# Patient Record
Sex: Female | Born: 1970 | Race: Asian | Hispanic: No | Marital: Married | Smoking: Never smoker
Health system: Southern US, Community
[De-identification: ages and names within clinical notes are randomized; demographics above are authoritative.]

## PROBLEM LIST (undated history)

## (undated) DIAGNOSIS — N318 Other neuromuscular dysfunction of bladder: Secondary | ICD-10-CM

## (undated) DIAGNOSIS — N3281 Overactive bladder: Secondary | ICD-10-CM

## (undated) DIAGNOSIS — M6740 Ganglion, unspecified site: Secondary | ICD-10-CM

## (undated) DIAGNOSIS — J309 Allergic rhinitis, unspecified: Secondary | ICD-10-CM

## (undated) DIAGNOSIS — E785 Hyperlipidemia, unspecified: Secondary | ICD-10-CM

## (undated) DIAGNOSIS — R7303 Prediabetes: Secondary | ICD-10-CM

## (undated) DIAGNOSIS — R1032 Left lower quadrant pain: Secondary | ICD-10-CM

## (undated) HISTORY — DX: Hyperlipidemia, unspecified: E78.5

## (undated) HISTORY — DX: Prediabetes: R73.03

## (undated) HISTORY — DX: Left lower quadrant pain: R10.32

## (undated) HISTORY — PX: CHOLECYSTECTOMY: SHX55

## (undated) HISTORY — DX: Overactive bladder: N32.81

## (undated) HISTORY — DX: Ganglion, unspecified site: M67.40

## (undated) HISTORY — DX: Other neuromuscular dysfunction of bladder: N31.8

## (undated) HISTORY — DX: Allergic rhinitis, unspecified: J30.9

---

## 2003-02-22 ENCOUNTER — Other Ambulatory Visit: Admission: RE | Admit: 2003-02-22 | Discharge: 2003-02-22 | Payer: Self-pay | Admitting: Obstetrics & Gynecology

## 2005-02-27 ENCOUNTER — Ambulatory Visit: Payer: Self-pay | Admitting: Internal Medicine

## 2005-04-16 ENCOUNTER — Emergency Department (HOSPITAL_COMMUNITY): Admission: EM | Admit: 2005-04-16 | Discharge: 2005-04-16 | Payer: Self-pay | Admitting: Family Medicine

## 2005-08-06 ENCOUNTER — Ambulatory Visit: Payer: Self-pay | Admitting: Internal Medicine

## 2005-08-13 ENCOUNTER — Ambulatory Visit: Payer: Self-pay | Admitting: Internal Medicine

## 2006-06-24 LAB — CONVERTED CEMR LAB: Pap Smear: NORMAL

## 2006-07-28 ENCOUNTER — Ambulatory Visit: Payer: Self-pay | Admitting: Internal Medicine

## 2007-05-15 ENCOUNTER — Ambulatory Visit: Payer: Self-pay | Admitting: Internal Medicine

## 2007-05-15 DIAGNOSIS — R1032 Left lower quadrant pain: Secondary | ICD-10-CM

## 2007-05-15 DIAGNOSIS — N318 Other neuromuscular dysfunction of bladder: Secondary | ICD-10-CM

## 2007-05-15 HISTORY — DX: Other neuromuscular dysfunction of bladder: N31.8

## 2007-05-15 HISTORY — DX: Left lower quadrant pain: R10.32

## 2007-05-17 DIAGNOSIS — J309 Allergic rhinitis, unspecified: Secondary | ICD-10-CM | POA: Insufficient documentation

## 2007-05-17 HISTORY — DX: Allergic rhinitis, unspecified: J30.9

## 2007-07-10 ENCOUNTER — Ambulatory Visit: Payer: Self-pay | Admitting: Internal Medicine

## 2007-07-10 LAB — CONVERTED CEMR LAB
ALT: 23 units/L (ref 0–35)
Albumin: 4.1 g/dL (ref 3.5–5.2)
BUN: 11 mg/dL (ref 6–23)
Basophils Relative: 0.1 % (ref 0.0–1.0)
Bilirubin Urine: NEGATIVE
Calcium: 9.3 mg/dL (ref 8.4–10.5)
Crystals: NEGATIVE
Eosinophils Relative: 2.7 % (ref 0.0–5.0)
GFR calc Af Amer: 122 mL/min
Glucose, Bld: 91 mg/dL (ref 70–99)
HCT: 43.6 % (ref 36.0–46.0)
HDL: 39.7 mg/dL (ref >39.0)
Hemoglobin: 14.7 g/dL (ref 12.0–15.0)
Monocytes Absolute: 0.5 10*3/uL (ref 0.1–1.0)
Monocytes Relative: 7 % (ref 3.0–12.0)
Neutro Abs: 5 10*3/uL (ref 1.4–7.7)
Nitrite: NEGATIVE
RDW: 11.5 % (ref 11.5–14.6)
Total Protein, Urine: NEGATIVE mg/dL
Total Protein: 7.9 g/dL (ref 6.0–8.3)
Triglycerides: 112 mg/dL (ref 0–149)
pH: 6 (ref 5.0–8.0)

## 2007-07-17 ENCOUNTER — Ambulatory Visit: Payer: Self-pay | Admitting: Internal Medicine

## 2007-07-20 ENCOUNTER — Encounter: Payer: Self-pay | Admitting: Internal Medicine

## 2008-05-31 ENCOUNTER — Ambulatory Visit: Payer: Self-pay | Admitting: Internal Medicine

## 2008-05-31 DIAGNOSIS — M6740 Ganglion, unspecified site: Secondary | ICD-10-CM | POA: Insufficient documentation

## 2008-05-31 DIAGNOSIS — J069 Acute upper respiratory infection, unspecified: Secondary | ICD-10-CM | POA: Insufficient documentation

## 2008-05-31 HISTORY — DX: Ganglion, unspecified site: M67.40

## 2008-08-12 ENCOUNTER — Encounter: Payer: Self-pay | Admitting: Internal Medicine

## 2009-09-21 ENCOUNTER — Ambulatory Visit: Payer: Self-pay | Admitting: Internal Medicine

## 2009-09-21 LAB — CONVERTED CEMR LAB
AST: 19 units/L (ref 0–37)
Albumin: 4.5 g/dL (ref 3.5–5.2)
Alkaline Phosphatase: 52 units/L (ref 39–117)
Basophils Absolute: 0 10*3/uL (ref 0.0–0.1)
Bilirubin Urine: NEGATIVE
Bilirubin, Direct: 0.2 mg/dL (ref 0.0–0.3)
Calcium: 9.5 mg/dL (ref 8.4–10.5)
Eosinophils Relative: 1.7 % (ref 0.0–5.0)
GFR calc non Af Amer: 102.73 mL/min (ref >60)
Glucose, Bld: 104 mg/dL — ABNORMAL HIGH (ref 70–99)
HDL: 48 mg/dL (ref >39.00)
Ketones, ur: NEGATIVE mg/dL
LDL Cholesterol: 104 mg/dL — ABNORMAL HIGH (ref 0–99)
Leukocytes, UA: NEGATIVE
MCV: 91.1 fL (ref 78.0–100.0)
Monocytes Absolute: 0.5 10*3/uL (ref 0.1–1.0)
Monocytes Relative: 6.5 % (ref 3.0–12.0)
Neutrophils Relative %: 67.3 % (ref 43.0–77.0)
Nitrite: NEGATIVE
Platelets: 212 10*3/uL (ref 150.0–400.0)
Potassium: 4.3 meq/L (ref 3.5–5.1)
RDW: 12.4 % (ref 11.5–14.6)
Sodium: 138 meq/L (ref 135–145)
Total CHOL/HDL Ratio: 4
Triglycerides: 125 mg/dL (ref 0.0–149.0)
Urobilinogen, UA: 0.2 (ref 0.0–1.0)
VLDL: 25 mg/dL (ref 0.0–40.0)
WBC: 7.2 10*3/uL (ref 4.5–10.5)
pH: 5.5 (ref 5.0–8.0)

## 2009-09-27 ENCOUNTER — Ambulatory Visit: Payer: Self-pay | Admitting: Internal Medicine

## 2010-04-24 NOTE — Medication Information (Signed)
Summary: Prior Authorization for Allegra-D/ [employer]  Prior Authorization for Allegra-D/ [employer]   Imported By: Maryln Gottron 07/22/2007 13:30:09  _____________________________________________________________________  External Attachment:    Type:   Image     Comment:   External Document

## 2010-04-24 NOTE — Assessment & Plan Note (Signed)
Summary: CPX /NWS   #   Vital Signs:  Patient profile:   40 year old female Height:      62 inches Weight:      129 pounds BMI:     23.68 O2 Sat:      98 % on Room air Temp:     98.2 degrees F oral Pulse rate:   80 / minute BP sitting:   104 / 80  (left arm) Cuff size:   regular  Vitals Entered By: Zella Ball Ewing CMA Duncan Dull) (September 27, 2009 10:35 AM)  O2 Flow:  Room air  Preventive Care Screening  Pap Smear:    Next Due:  06/2009  CC: Adult Physical/RE   CC:  Adult Physical/RE.  History of Present Illness: overall doing well;  Pt denies CP, sob, doe, wheezing, orthopnea, pnd, worsening LE edema, palps, dizziness or syncope  Pt denies new neuro symptoms such as headache, facial or extremity weakness  No new  complaints  Problems Prior to Update: 1)  Ganglion Cyst, Wrist, Right  (ICD-727.41) 2)  Uri  (ICD-465.9) 3)  Preventive Health Care  (ICD-V70.0) 4)  Overactive Bladder  (ICD-596.51) 5)  Allergic Rhinitis  (ICD-477.9) 6)  Abdominal Pain, Left Lower Quadrant  (ICD-789.04)  Medications Prior to Update: 1)  Allegra-D 12 Hour 60-120 Mg  Tb12 (Fexofenadine-Pseudoephedrine) .Marland Kitchen.. 1 By Mouth Two Times A Day Prn  Current Medications (verified): 1)  Allegra-D 12 Hour 60-120 Mg  Tb12 (Fexofenadine-Pseudoephedrine) .Marland Kitchen.. 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         all otherwise negative per pt -    Physical Exam  General:  alert and well-developed.   Head:  normocephalic and atraumatic.   Eyes:  vision grossly intact, pupils equal, and pupils round.   Ears:  R ear normal and L ear normal.   Nose:  no external deformity and no nasal discharge.   Mouth:  no gingival abnormalities and pharynx pink and moist.   Neck:  supple and no masses.   Lungs:  normal respiratory effort and normal breath sounds.   Heart:  normal rate and regular rhythm.   Abdomen:  soft, non-tender, and normal bowel sounds.   Msk:  no joint tenderness and no joint swelling.   Extremities:  no edema, no erythema  Neurologic:  cranial nerves II-XII intact and strength normal in all extremities.   Skin:  color normal and no rashes.   Psych:  not anxious appearing and not depressed appearing.     Impression &  Recommendations:  Problem # 1:  Preventive Health Care (ICD-V70.0) Overall doing well, age appropriate education and counseling updated and referral for appropriate preventive services done unless declined, immunizations up to date or declined, diet counseling done if overweight, urged to quit smoking if smokes , most recent labs reviewed and current ordered if appropriate, ecg reviewed or declined (interpretation per ECG scanned in the EMR if done); information regarding Medicare Prevention  requirements given if appropriate; speciality referrals updated as appropriate   Complete Medication List: 1)  Allegra-d 12 Hour 60-120 Mg Tb12 (Fexofenadine-pseudoephedrine) .Marland Kitchen.. 1 by mouth two times a day prn  Other Orders: Tdap => 26yrs IM (16109) Admin 1st Vaccine (60454)  Patient Instructions: 1)  please call for your pap smear with your GYN 2)  you had the tetanus shot today 3)  please call for your baseline mammogram  - consider [facility] on [address], or [facility] [address] 4)  Continue all previous medications as before this visit  5)  please follow lower cholesterol diet 6)  Please schedule a follow-up appointment in 1 year or sooner if needed   Immunizations Administered:  Tetanus Vaccine:    Vaccine Type: Tdap    Site: left deltoid    Mfr: GlaxoSmithKline    Dose: 0.5 ml    Route: IM    Given by: Zella Ball Ewing CMA (AAMA)    Exp. Date: 06/16/2011    Lot #: [id number]    VIS given: 02/10/07 version given September 27, 2009.

## 2010-11-27 ENCOUNTER — Encounter: Payer: Self-pay | Admitting: Internal Medicine

## 2010-12-06 ENCOUNTER — Other Ambulatory Visit: Payer: Self-pay | Admitting: Internal Medicine

## 2010-12-06 DIAGNOSIS — Z0000 Encounter for general adult medical examination without abnormal findings: Secondary | ICD-10-CM

## 2010-12-07 ENCOUNTER — Encounter: Payer: Self-pay | Admitting: Internal Medicine

## 2010-12-07 ENCOUNTER — Other Ambulatory Visit: Payer: Self-pay

## 2010-12-07 DIAGNOSIS — Z0000 Encounter for general adult medical examination without abnormal findings: Secondary | ICD-10-CM | POA: Insufficient documentation

## 2010-12-07 DIAGNOSIS — Z0001 Encounter for general adult medical examination with abnormal findings: Secondary | ICD-10-CM | POA: Insufficient documentation

## 2010-12-12 ENCOUNTER — Other Ambulatory Visit (INDEPENDENT_AMBULATORY_CARE_PROVIDER_SITE_OTHER): Payer: Self-pay

## 2010-12-12 DIAGNOSIS — Z0000 Encounter for general adult medical examination without abnormal findings: Secondary | ICD-10-CM

## 2010-12-12 LAB — CBC WITH DIFFERENTIAL/PLATELET
Basophils Relative: 0.5 % (ref 0.0–3.0)
Eosinophils Absolute: 0.1 10*3/uL (ref 0.0–0.7)
Hemoglobin: 14.1 g/dL (ref 12.0–15.0)
Lymphs Abs: 1.6 10*3/uL (ref 0.7–4.0)
MCHC: 33.7 g/dL (ref 30.0–36.0)
MCV: 91.1 fl (ref 78.0–100.0)
Monocytes Absolute: 0.4 10*3/uL (ref 0.1–1.0)
Neutro Abs: 3.8 10*3/uL (ref 1.4–7.7)
RBC: 4.61 Mil/uL (ref 3.87–5.11)

## 2010-12-12 LAB — COMPREHENSIVE METABOLIC PANEL
AST: 20 U/L (ref 0–37)
Alkaline Phosphatase: 45 U/L (ref 39–117)
BUN: 11 mg/dL (ref 6–23)
Calcium: 9.6 mg/dL (ref 8.4–10.5)
Creatinine, Ser: 0.7 mg/dL (ref 0.4–1.2)

## 2010-12-12 LAB — URINALYSIS, ROUTINE W REFLEX MICROSCOPIC
Ketones, ur: NEGATIVE
Specific Gravity, Urine: 1.025 (ref 1.000–1.030)
Urine Glucose: NEGATIVE
Urobilinogen, UA: 0.2 (ref 0.0–1.0)

## 2010-12-12 LAB — LIPID PANEL
HDL: 47 mg/dL (ref >39.00)
LDL Cholesterol: 96 mg/dL (ref 0–99)
Total CHOL/HDL Ratio: 3
Triglycerides: 105 mg/dL (ref 0.0–149.0)

## 2010-12-13 ENCOUNTER — Encounter: Payer: Self-pay | Admitting: Internal Medicine

## 2010-12-13 ENCOUNTER — Ambulatory Visit (INDEPENDENT_AMBULATORY_CARE_PROVIDER_SITE_OTHER): Payer: BC Managed Care – PPO | Admitting: Internal Medicine

## 2010-12-13 VITALS — BP 100/62 | HR 65 | Temp 98.1°F | Ht 62.0 in | Wt 128.8 lb

## 2010-12-13 DIAGNOSIS — Z0000 Encounter for general adult medical examination without abnormal findings: Secondary | ICD-10-CM

## 2010-12-13 DIAGNOSIS — Z23 Encounter for immunization: Secondary | ICD-10-CM

## 2010-12-13 NOTE — Assessment & Plan Note (Signed)

## 2010-12-13 NOTE — Patient Instructions (Signed)
You had the flu shot today You are otherwise up to date, though I would recommend calling [facility] (on [address]) for a mammogram Please consider a self referral (or through your pediatrician) to Dr Ellison Carwin, Neurology for your son The current medical regimen is effective;  continue present plan and medications. Please return in 1 year for your yearly visit, or sooner if needed, with Lab testing done 3-5 days before

## 2010-12-13 NOTE — Progress Notes (Signed)
Subjective:    Patient ID: Becky Shaw, female    DOB: October 28, 1970, 40 y.o.   MRN: [id number]  HPI  Here for wellness and f/u;  Overall doing ok;  Pt denies CP, worsening SOB, DOE, wheezing, orthopnea, PND, worsening LE edema, palpitations, dizziness or syncope.  Pt denies neurological change such as new Headache, facial or extremity weakness.  Pt denies polydipsia, polyuria, or low sugar symptoms. Pt states overall good compliance with treatment and medications, good tolerability, and trying to follow lower cholesterol diet.  Pt denies worsening depressive symptoms, suicidal ideation or panic. No fever, wt loss, night sweats, loss of appetite, or other constitutional symptoms.  Pt states good ability with ADL's, low fall risk, home safety reviewed and adequate, no significant changes in hearing or vision, and walks 3 miles per day, and does some total gym most days as well, daily since feb 2012.  Of mention she was on menses at time of UA.  Does mention she has a 39 yo adopted [place] child who has been making what sounds like several months involuntary facial movements Past Medical History  Diagnosis Date  . Abdominal pain, left lower quadrant 05/15/2007  . ALLERGIC RHINITIS 05/17/2007  . GANGLION CYST, WRIST, RIGHT 05/31/2008  . OVERACTIVE BLADDER 05/15/2007  . OAB (overactive bladder)    Past Surgical History  Procedure Date  . Cholecystectomy     reports that she has never smoked. She does not have any smokeless tobacco history on file. She reports that she drinks alcohol. She reports that she does not use illicit drugs. family history includes Hypertension in her other and Stroke in her other. Allergies  Allergen Reactions  . Cetirizine Hcl     REACTION: insomnia   Current Outpatient Prescriptions on File Prior to Visit  Medication Sig Dispense Refill  . fexofenadine-pseudoephedrine (ALLEGRA-D 12 HOUR) 60-120 MG per tablet Take 1 tablet by mouth 2 (two) times daily.         Review of  Systems Review of Systems  Constitutional: Negative for diaphoresis, activity change, appetite change and unexpected weight change.  HENT: Negative for hearing loss, ear pain, facial swelling, mouth sores and neck stiffness.   Eyes: Negative for pain, redness and visual disturbance.  Respiratory: Negative for shortness of breath and wheezing.   Cardiovascular: Negative for chest pain and palpitations.  Gastrointestinal: Negative for diarrhea, blood in stool, abdominal distention and rectal pain.  Genitourinary: Negative for hematuria, flank pain and decreased urine volume.  Musculoskeletal: Negative for myalgias and joint swelling.  Skin: Negative for color change and wound.  Neurological: Negative for syncope and numbness.  Hematological: Negative for adenopathy.  Psychiatric/Behavioral: Negative for hallucinations, self-injury, decreased concentration and agitation.      Objective:   Physical Exam BP 100/62  Pulse 65  Temp(Src) 98.1 F (36.7 C) (Oral)  Ht 5\' 2"  (1.575 m)  Wt 128 lb 12 oz (58.401 kg)  BMI 23.55 kg/m2  SpO2 95%  LMP 12/10/2010 Physical Exam  VS noted Constitutional: Pt is oriented to person, place, and time. Appears well-developed and well-nourished.  HENT:  Head: Normocephalic and atraumatic.  Right Ear: External ear normal.  Left Ear: External ear normal.  Nose: Nose normal.  Mouth/Throat: Oropharynx is clear and moist.  Eyes: Conjunctivae and EOM are normal. Pupils are equal, round, and reactive to light.  Neck: Normal range of motion. Neck supple. No JVD present. No tracheal deviation present.  Cardiovascular: Normal rate, regular rhythm, normal heart sounds and intact distal pulses.  Pulmonary/Chest: Effort normal and breath sounds normal.  Abdominal: Soft. Bowel sounds are normal. There is no tenderness.  Musculoskeletal: Normal range of motion. Exhibits no edema.  Lymphadenopathy:  Has no cervical adenopathy.  Neurological: Pt is alert and oriented  to person, place, and time. Pt has normal reflexes. No cranial nerve deficit.  Skin: Skin is warm and dry. No rash noted.  Psychiatric:  Has  normal mood and affect. Behavior is normal.         Assessment & Plan:

## 2011-03-21 ENCOUNTER — Other Ambulatory Visit: Payer: Self-pay | Admitting: Obstetrics and Gynecology

## 2011-03-21 DIAGNOSIS — N63 Unspecified lump in unspecified breast: Secondary | ICD-10-CM

## 2011-03-29 ENCOUNTER — Ambulatory Visit
Admission: RE | Admit: 2011-03-29 | Discharge: 2011-03-29 | Disposition: A | Discharge status: Home / Self Care | Payer: BC Managed Care – PPO | Source: Ambulatory Visit | Attending: Obstetrics and Gynecology | Admitting: Obstetrics and Gynecology

## 2011-03-29 DIAGNOSIS — N63 Unspecified lump in unspecified breast: Secondary | ICD-10-CM

## 2011-12-10 ENCOUNTER — Other Ambulatory Visit (INDEPENDENT_AMBULATORY_CARE_PROVIDER_SITE_OTHER): Payer: BC Managed Care – PPO

## 2011-12-10 DIAGNOSIS — Z0000 Encounter for general adult medical examination without abnormal findings: Secondary | ICD-10-CM

## 2011-12-10 LAB — CBC WITH DIFFERENTIAL/PLATELET
Basophils Relative: 0.4 % (ref 0.0–3.0)
Eosinophils Relative: 1.6 % (ref 0.0–5.0)
Lymphocytes Relative: 30.4 % (ref 12.0–46.0)
MCV: 91.2 fl (ref 78.0–100.0)
Monocytes Relative: 6.2 % (ref 3.0–12.0)
Neutrophils Relative %: 61.4 % (ref 43.0–77.0)
RBC: 4.63 Mil/uL (ref 3.87–5.11)
WBC: 7 10*3/uL (ref 4.5–10.5)

## 2011-12-10 LAB — URINALYSIS, ROUTINE W REFLEX MICROSCOPIC
Bilirubin Urine: NEGATIVE
Ketones, ur: NEGATIVE
Urine Glucose: NEGATIVE
pH: 6 (ref 5.0–8.0)

## 2011-12-10 LAB — HEPATIC FUNCTION PANEL
Albumin: 4.2 g/dL (ref 3.5–5.2)
Alkaline Phosphatase: 40 U/L (ref 39–117)
Total Protein: 7.9 g/dL (ref 6.0–8.3)

## 2011-12-10 LAB — BASIC METABOLIC PANEL
CO2: 26 mEq/L (ref 19–32)
Chloride: 99 mEq/L (ref 96–112)
Creatinine, Ser: 0.6 mg/dL (ref 0.4–1.2)
GFR: 112.99 mL/min (ref >60.00)
Potassium: 3.8 mEq/L (ref 3.5–5.1)

## 2011-12-10 LAB — LIPID PANEL
HDL: 38.7 mg/dL — ABNORMAL LOW (ref >39.00)
LDL Cholesterol: 108 mg/dL — ABNORMAL HIGH (ref 0–99)
Total CHOL/HDL Ratio: 4
Triglycerides: 138 mg/dL (ref 0.0–149.0)
VLDL: 27.6 mg/dL (ref 0.0–40.0)

## 2011-12-10 LAB — TSH: TSH: 1.19 u[IU]/mL (ref 0.35–5.50)

## 2011-12-16 ENCOUNTER — Ambulatory Visit (INDEPENDENT_AMBULATORY_CARE_PROVIDER_SITE_OTHER): Payer: BC Managed Care – PPO | Admitting: Internal Medicine

## 2011-12-16 ENCOUNTER — Encounter: Payer: Self-pay | Admitting: Internal Medicine

## 2011-12-16 VITALS — BP 110/70 | HR 78 | Temp 97.6°F | Ht 62.0 in | Wt 134.4 lb

## 2011-12-16 DIAGNOSIS — K120 Recurrent oral aphthae: Secondary | ICD-10-CM

## 2011-12-16 DIAGNOSIS — Z23 Encounter for immunization: Secondary | ICD-10-CM

## 2011-12-16 DIAGNOSIS — Z0000 Encounter for general adult medical examination without abnormal findings: Secondary | ICD-10-CM

## 2011-12-16 MED ORDER — TRIAMCINOLONE ACETONIDE 0.1 % MT PSTE: PASTE | Freq: Two times a day (BID) | OROMUCOSAL | Status: DC

## 2011-12-16 NOTE — Assessment & Plan Note (Signed)

## 2011-12-16 NOTE — Patient Instructions (Addendum)
Take all new medications as prescribed Continue all other medications as before Please continue your efforts at being more active, low cholesterol diet, and weight control. You are otherwise up to date with prevention measures Please keep your appointments with your specialists as you have planned  - yearly GYN Please return in 1 year for your yearly visit, or sooner if needed, with Lab testing done 3-5 days before

## 2011-12-16 NOTE — Assessment & Plan Note (Signed)
For kenalog in orabase asd,  to f/u any worsening symptoms or concerns

## 2011-12-16 NOTE — Progress Notes (Signed)
Subjective:    Patient ID: Becky Shaw, female    DOB: 10-11-1970, 41 y.o.   MRN: [id number]  HPI  Here for wellness and f/u;  Overall doing ok;  Pt denies CP, worsening SOB, DOE, wheezing, orthopnea, PND, worsening LE edema, palpitations, dizziness or syncope.  Pt denies neurological change such as new Headache, facial or extremity weakness.  Pt denies polydipsia, polyuria, or low sugar symptoms. Pt states overall good compliance with treatment and medications, good tolerability, and trying to follow lower cholesterol diet.  Pt denies worsening depressive symptoms, suicidal ideation or panic. No fever, wt loss, night sweats, loss of appetite, or other constitutional symptoms.  Pt states good ability with ADL's, low fall risk, home safety reviewed and adequate, no significant changes in hearing or vision, and occasionally active with exercise.  Has recent mammogram/u/s for transient lump left breast, neg. Does also have several painful small mouth ulcers, no hx sjogrens or similar symptoms, or rheum diz Past Medical History  Diagnosis Date  . Abdominal pain, left lower quadrant 05/15/2007  . ALLERGIC RHINITIS 05/17/2007  . GANGLION CYST, WRIST, RIGHT 05/31/2008  . OVERACTIVE BLADDER 05/15/2007  . OAB (overactive bladder)    Past Surgical History  Procedure Date  . Cholecystectomy     reports that she has never smoked. She does not have any smokeless tobacco history on file. She reports that she drinks alcohol. She reports that she does not use illicit drugs. family history includes Hypertension in her other and Stroke in her other. Allergies  Allergen Reactions  . Cetirizine Hcl     REACTION: insomnia   Current Outpatient Prescriptions on File Prior to Visit  Medication Sig Dispense Refill  . fexofenadine-pseudoephedrine (ALLEGRA-D 12 HOUR) 60-120 MG per tablet Take 1 tablet by mouth 2 (two) times daily.         Review of Systems Review of Systems  Constitutional: Negative for diaphoresis,  activity change, appetite change and unexpected weight change.  HENT: Negative for hearing loss, ear pain, facial swelling, mouth sores and neck stiffness.   Eyes: Negative for pain, redness and visual disturbance.  Respiratory: Negative for shortness of breath and wheezing.   Cardiovascular: Negative for chest pain and palpitations.  Gastrointestinal: Negative for diarrhea, blood in stool, abdominal distention and rectal pain.  Genitourinary: Negative for hematuria, flank pain and decreased urine volume.  Musculoskeletal: Negative for myalgias and joint swelling.  Skin: Negative for color change and wound.  Neurological: Negative for syncope and numbness.  Hematological: Negative for adenopathy.  Psychiatric/Behavioral: Negative for hallucinations, self-injury, decreased concentration and agitation.      Objective:   Physical Exam BP 110/70  Pulse 78  Temp 97.6 F (36.4 C) (Oral)  Ht 5\' 2"  (1.575 m)  Wt 134 lb 6 oz (60.952 kg)  BMI 24.58 kg/m2  SpO2 97% Physical Exam  VS noted Constitutional: Pt is oriented to person, place, and time. Appears well-developed and well-nourished.  Head: Normocephalic and atraumatic.  Right Ear: External ear normal.  Left Ear: External ear normal.  Nose: Nose normal.  Mouth/Throat: Oropharynx is clear and moist. except for several apthous type ulcers below tongue Eyes: Conjunctivae and EOM are normal. Pupils are equal, round, and reactive to light.  Neck: Normal range of motion. Neck supple. No JVD present. No tracheal deviation present.  Cardiovascular: Normal rate, regular rhythm, normal heart sounds and intact distal pulses.   Pulmonary/Chest: Effort normal and breath sounds normal.  Abdominal: Soft. Bowel sounds are normal. There is no  tenderness.  Musculoskeletal: Normal range of motion. Exhibits no edema.  Lymphadenopathy:  Has no cervical adenopathy.  Neurological: Pt is alert and oriented to person, place, and time. Pt has normal reflexes.  No cranial nerve deficit.  Skin: Skin is warm and dry. No rash noted.  Psychiatric:  Has  normal mood and affect. Behavior is normal.     Assessment & Plan:

## 2012-04-28 ENCOUNTER — Other Ambulatory Visit: Payer: Self-pay | Admitting: Obstetrics and Gynecology

## 2012-04-28 DIAGNOSIS — Z1231 Encounter for screening mammogram for malignant neoplasm of breast: Secondary | ICD-10-CM

## 2012-06-01 ENCOUNTER — Ambulatory Visit
Admission: RE | Admit: 2012-06-01 | Discharge: 2012-06-01 | Disposition: A | Discharge status: Home / Self Care | Payer: BC Managed Care – PPO | Source: Ambulatory Visit | Attending: Obstetrics and Gynecology | Admitting: Obstetrics and Gynecology

## 2012-06-01 DIAGNOSIS — Z1231 Encounter for screening mammogram for malignant neoplasm of breast: Secondary | ICD-10-CM

## 2013-04-14 ENCOUNTER — Other Ambulatory Visit: Payer: Self-pay

## 2013-04-14 DIAGNOSIS — Z1231 Encounter for screening mammogram for malignant neoplasm of breast: Secondary | ICD-10-CM

## 2013-06-04 ENCOUNTER — Ambulatory Visit
Admission: RE | Admit: 2013-06-04 | Discharge: 2013-06-04 | Disposition: A | Discharge status: Home / Self Care | Payer: BC Managed Care – PPO | Source: Ambulatory Visit

## 2013-06-04 DIAGNOSIS — Z1231 Encounter for screening mammogram for malignant neoplasm of breast: Secondary | ICD-10-CM

## 2013-06-04 LAB — HM MAMMOGRAPHY

## 2014-02-01 ENCOUNTER — Ambulatory Visit: Payer: BC Managed Care – PPO | Admitting: Internal Medicine

## 2014-02-14 ENCOUNTER — Telehealth: Payer: Self-pay | Admitting: Internal Medicine

## 2014-02-14 DIAGNOSIS — Z0000 Encounter for general adult medical examination without abnormal findings: Secondary | ICD-10-CM

## 2014-02-14 NOTE — Telephone Encounter (Signed)
Patient came in for labs today but they are not entered.  Please follow up with patient in regards.

## 2014-02-14 NOTE — Telephone Encounter (Signed)
Pt has [employer] notified pt cpx labs entered...Raechel Chute/lmb

## 2014-02-16 ENCOUNTER — Other Ambulatory Visit (INDEPENDENT_AMBULATORY_CARE_PROVIDER_SITE_OTHER): Payer: PRIVATE HEALTH INSURANCE

## 2014-02-16 DIAGNOSIS — Z0000 Encounter for general adult medical examination without abnormal findings: Secondary | ICD-10-CM

## 2014-02-16 LAB — CBC WITH DIFFERENTIAL/PLATELET
BASOS ABS: 0 10*3/uL (ref 0.0–0.1)
Basophils Relative: 0.5 % (ref 0.0–3.0)
EOS ABS: 0.1 10*3/uL (ref 0.0–0.7)
Eosinophils Relative: 1 % (ref 0.0–5.0)
HCT: 43 % (ref 36.0–46.0)
Hemoglobin: 14.3 g/dL (ref 12.0–15.0)
Lymphocytes Relative: 25.6 % (ref 12.0–46.0)
Lymphs Abs: 1.7 10*3/uL (ref 0.7–4.0)
MCHC: 33.3 g/dL (ref 30.0–36.0)
MCV: 89.8 fl (ref 78.0–100.0)
MONO ABS: 0.4 10*3/uL (ref 0.1–1.0)
Monocytes Relative: 5.6 % (ref 3.0–12.0)
NEUTROS PCT: 67.3 % (ref 43.0–77.0)
Neutro Abs: 4.5 10*3/uL (ref 1.4–7.7)
Platelets: 226 10*3/uL (ref 150.0–400.0)
RBC: 4.78 Mil/uL (ref 3.87–5.11)
RDW: 12.4 % (ref 11.5–15.5)
WBC: 6.7 10*3/uL (ref 4.0–10.5)

## 2014-02-16 LAB — HEPATIC FUNCTION PANEL
ALBUMIN: 4.2 g/dL (ref 3.5–5.2)
ALT: 23 U/L (ref 0–35)
AST: 18 U/L (ref 0–37)
Alkaline Phosphatase: 50 U/L (ref 39–117)
BILIRUBIN DIRECT: 0.1 mg/dL (ref 0.0–0.3)
Total Bilirubin: 0.6 mg/dL (ref 0.2–1.2)
Total Protein: 7.6 g/dL (ref 6.0–8.3)

## 2014-02-16 LAB — LIPID PANEL
CHOLESTEROL: 130 mg/dL (ref 0–200)
HDL: 30.3 mg/dL — ABNORMAL LOW (ref >39.00)
LDL CALC: 81 mg/dL (ref 0–99)
NonHDL: 99.7
Total CHOL/HDL Ratio: 4
Triglycerides: 95 mg/dL (ref 0.0–149.0)
VLDL: 19 mg/dL (ref 0.0–40.0)

## 2014-02-16 LAB — URINALYSIS, ROUTINE W REFLEX MICROSCOPIC
BILIRUBIN URINE: NEGATIVE
Ketones, ur: NEGATIVE
NITRITE: NEGATIVE
Specific Gravity, Urine: 1.025 (ref 1.000–1.030)
Total Protein, Urine: NEGATIVE
UROBILINOGEN UA: 0.2 (ref 0.0–1.0)
Urine Glucose: NEGATIVE
pH: 5.5 (ref 5.0–8.0)

## 2014-02-16 LAB — BASIC METABOLIC PANEL
BUN: 12 mg/dL (ref 6–23)
CHLORIDE: 105 meq/L (ref 96–112)
CO2: 25 meq/L (ref 19–32)
Calcium: 9.1 mg/dL (ref 8.4–10.5)
Creatinine, Ser: 0.7 mg/dL (ref 0.4–1.2)
GFR: 102.22 mL/min (ref >60.00)
Glucose, Bld: 97 mg/dL (ref 70–99)
Potassium: 3.9 mEq/L (ref 3.5–5.1)
Sodium: 137 mEq/L (ref 135–145)

## 2014-02-16 LAB — TSH: TSH: 1.69 u[IU]/mL (ref 0.35–4.50)

## 2014-02-23 ENCOUNTER — Ambulatory Visit (INDEPENDENT_AMBULATORY_CARE_PROVIDER_SITE_OTHER): Payer: PRIVATE HEALTH INSURANCE | Admitting: Internal Medicine

## 2014-02-23 ENCOUNTER — Encounter: Payer: Self-pay | Admitting: Internal Medicine

## 2014-02-23 VITALS — BP 98/78 | HR 114 | Temp 98.3°F | Ht 62.5 in | Wt 138.0 lb

## 2014-02-23 DIAGNOSIS — N3000 Acute cystitis without hematuria: Secondary | ICD-10-CM

## 2014-02-23 DIAGNOSIS — N390 Urinary tract infection, site not specified: Secondary | ICD-10-CM | POA: Insufficient documentation

## 2014-02-23 DIAGNOSIS — Z0000 Encounter for general adult medical examination without abnormal findings: Secondary | ICD-10-CM

## 2014-02-23 MED ORDER — ONDANSETRON HCL 4 MG PO TABS: 4.0000 mg | ORAL_TABLET | Freq: Three times a day (TID) | ORAL | Status: DC | PRN

## 2014-02-23 MED ORDER — CEPHALEXIN 500 MG PO CAPS: 500.0000 mg | ORAL_CAPSULE | Freq: Four times a day (QID) | ORAL | Status: DC

## 2014-02-23 NOTE — Progress Notes (Signed)
Subjective:    Patient ID: Becky Shaw, female    DOB: [birth year], 43 y.o.   MRN: [id number]  HPI  Here for wellness and f/u;  Overall doing ok;  Pt denies CP, worsening SOB, DOE, wheezing, orthopnea, PND, worsening LE edema, palpitations, dizziness or syncope.  Pt denies neurological change such as new headache, facial or extremity weakness.  Pt denies polydipsia, polyuria, or low sugar symptoms. Pt states overall good compliance with treatment and medications, good tolerability, and has been trying to follow lower cholesterol diet.  Pt denies worsening depressive symptoms, suicidal ideation or panic. No fever, night sweats, wt loss, loss of appetite, or other constitutional symptoms.  Pt states good ability with ADL's, has low fall risk, home safety reviewed and adequate, no other significant changes in hearing or vision, and only occasionally active with exercise. Incidnetly with acute onset n/v/d mild since night, doesn't feel well, with mild dysuria    Declines flu shot Past Medical History  Diagnosis Date  . Abdominal pain, left lower quadrant 05/15/2007  . ALLERGIC RHINITIS 05/17/2007  . GANGLION CYST, WRIST, RIGHT 05/31/2008  . OVERACTIVE BLADDER 05/15/2007  . OAB (overactive bladder)    Past Surgical History  Procedure Laterality Date  . Cholecystectomy      reports that she has never smoked. She does not have any smokeless tobacco history on file. She reports that she drinks alcohol. She reports that she does not use illicit drugs. family history includes Hypertension in her other; Stroke in her other. Allergies  Allergen Reactions  . Cetirizine Hcl     REACTION: insomnia   No current outpatient prescriptions on file prior to visit.   No current facility-administered medications on file prior to visit.   Review of Systems Constitutional: Negative for increased diaphoresis, other activity, appetite or other siginficant weight change  HENT: Negative for worsening hearing loss,  ear pain, facial swelling, mouth sores and neck stiffness.   Eyes: Negative for other worsening pain, redness or visual disturbance.  Respiratory: Negative for shortness of breath and wheezing.   Cardiovascular: Negative for chest pain and palpitations.  Gastrointestinal: Negative for diarrhea, blood in stool, abdominal distention or other pain Genitourinary: Negative for hematuria, flank pain or change in urine volume.  Musculoskeletal: Negative for myalgias or other joint complaints.  Skin: Negative for color change and wound.  Neurological: Negative for syncope and numbness. other than noted Hematological: Negative for adenopathy. or other swelling Psychiatric/Behavioral: Negative for hallucinations, self-injury, decreased concentration or other worsening agitation.      Objective:   Physical Exam BP 98/78 mmHg  Pulse 114  Temp(Src) 98.3 F (36.8 C) (Oral)  Ht 5' 2.5" (1.588 m)  Wt 138 lb (62.596 kg)  BMI 24.82 kg/m2  SpO2 98% VS noted,  Constitutional: Pt is oriented to person, place, and time. Appears well-developed and well-nourished.  Head: Normocephalic and atraumatic.  Right Ear: External ear normal.  Left Ear: External ear normal.  Nose: Nose normal.  Mouth/Throat: Oropharynx is clear and moist.  Eyes: Conjunctivae and EOM are normal. Pupils are equal, round, and reactive to light.  Neck: Normal range of motion. Neck supple. No JVD present. No tracheal deviation present.  Cardiovascular: Normal rate, regular rhythm, normal heart sounds and intact distal pulses.   Pulmonary/Chest: Effort normal and breath sounds without rales or wheezing  Abdominal: Soft. Bowel sounds are normal. NT. No HSM  Musculoskeletal: Normal range of motion. Exhibits no edema.  Lymphadenopathy:  Has no cervical adenopathy.  Neurological:  Pt is alert and oriented to person, place, and time. Pt has normal reflexes. No cranial nerve deficit. Motor grossly intact Skin: Skin is warm and dry. No rash  noted.  Psychiatric:  Has normal mood and affect. Behavior is normal.      Assessment & Plan:

## 2014-02-23 NOTE — Patient Instructions (Signed)
Please take all new medication as prescribed - the antibiotic, and the nausea medication if needed  Please continue all other medications as before, and refills have been done if requested.  Please have the pharmacy call with any other refills you may need.  Please continue your efforts at being more active, low cholesterol diet, and weight control.  You are otherwise up to date with prevention measures today.  Please keep your appointments with your specialists as you may have planned  Please return in 1 year for your yearly visit, or sooner if needed, with Lab testing done 3-5 days before

## 2014-02-23 NOTE — Progress Notes (Signed)
Pre visit review using our clinic review tool, if applicable. No additional management support is needed unless otherwise documented below in the visit note. 

## 2014-02-23 NOTE — Assessment & Plan Note (Signed)
Mild to mod, for antibx course,  to f/u any worsening symptoms or concerns, also for antiemetic prn

## 2014-02-23 NOTE — Assessment & Plan Note (Signed)

## 2014-10-31 ENCOUNTER — Encounter (HOSPITAL_COMMUNITY): Payer: Self-pay | Admitting: Emergency Medicine

## 2014-10-31 ENCOUNTER — Emergency Department (INDEPENDENT_AMBULATORY_CARE_PROVIDER_SITE_OTHER)
Admission: EM | Admit: 2014-10-31 | Discharge: 2014-10-31 | Disposition: A | Discharge status: Home / Self Care | Payer: Self-pay | Source: Home / Self Care | Attending: Family Medicine | Admitting: Family Medicine

## 2014-10-31 DIAGNOSIS — Z041 Encounter for examination and observation following transport accident: Secondary | ICD-10-CM
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
## 2014-11-02 ENCOUNTER — Encounter (HOSPITAL_COMMUNITY): Payer: Self-pay | Admitting: *Deleted

## 2014-11-02 ENCOUNTER — Emergency Department (HOSPITAL_COMMUNITY)
Admission: EM | Admit: 2014-11-02 | Discharge: 2014-11-02 | Disposition: A | Discharge status: Home / Self Care | Payer: PRIVATE HEALTH INSURANCE | Attending: Emergency Medicine | Admitting: Emergency Medicine

## 2014-11-02 DIAGNOSIS — R42 Dizziness and giddiness: Secondary | ICD-10-CM | POA: Diagnosis not present

## 2014-11-02 DIAGNOSIS — R51 Headache: Secondary | ICD-10-CM | POA: Insufficient documentation

## 2014-11-02 DIAGNOSIS — R110 Nausea: Secondary | ICD-10-CM | POA: Insufficient documentation

## 2014-11-02 DIAGNOSIS — R519 Headache, unspecified: Secondary | ICD-10-CM

## 2014-11-02 DIAGNOSIS — Z87448 Personal history of other diseases of urinary system: Secondary | ICD-10-CM | POA: Diagnosis not present

## 2014-11-02 MED ORDER — ONDANSETRON 4 MG PO TBDP
4.0000 mg | ORAL_TABLET | Freq: Once | ORAL | Status: AC
  Administered 2014-11-02: 4 mg via ORAL
  Filled 2014-11-02: qty 1
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
## 2014-11-02 NOTE — ED Notes (Signed)
Patient reports she has throbbing headache with dizziness and nausea.  She states she has uneasy/shakiness feeling.  Patient denies neck or back pain.  She has not had any pain meds today.

## 2014-11-02 NOTE — Discharge Instructions (Signed)
Return to the ED with any concerns including vomiting, seizure activity, changes in vision or speech, difficulty breathing, abdominal pain, decreased level of alertness/lethargy, or any other alarming symptoms
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
## 2015-03-12 ENCOUNTER — Emergency Department (HOSPITAL_COMMUNITY)
Admission: EM | Admit: 2015-03-12 | Discharge: 2015-03-12 | Disposition: A | Discharge status: Home / Self Care | Payer: PRIVATE HEALTH INSURANCE | Attending: Emergency Medicine | Admitting: Emergency Medicine

## 2015-03-12 ENCOUNTER — Encounter (HOSPITAL_COMMUNITY): Payer: Self-pay | Admitting: Emergency Medicine

## 2015-03-12 DIAGNOSIS — Z8739 Personal history of other diseases of the musculoskeletal system and connective tissue: Secondary | ICD-10-CM | POA: Insufficient documentation

## 2015-03-12 DIAGNOSIS — Z8742 Personal history of other diseases of the female genital tract: Secondary | ICD-10-CM | POA: Insufficient documentation

## 2015-03-12 DIAGNOSIS — I159 Secondary hypertension, unspecified: Secondary | ICD-10-CM

## 2015-03-12 LAB — COMPREHENSIVE METABOLIC PANEL WITH GFR
ALT: 21 U/L (ref 14–54)
AST: 19 U/L (ref 15–41)
Albumin: 4.1 g/dL (ref 3.5–5.0)
Alkaline Phosphatase: 45 U/L (ref 38–126)
Anion gap: 7 (ref 5–15)
BUN: 9 mg/dL (ref 6–20)
CO2: 25 mmol/L (ref 22–32)
Calcium: 9.6 mg/dL (ref 8.9–10.3)
Chloride: 105 mmol/L (ref 101–111)
Creatinine, Ser: 0.7 mg/dL (ref 0.44–1.00)
GFR calc Af Amer: >60 mL/min
GFR calc non Af Amer: >60 mL/min
Glucose, Bld: 107 mg/dL — ABNORMAL HIGH (ref 65–99)
Potassium: 3.4 mmol/L — ABNORMAL LOW (ref 3.5–5.1)
Sodium: 137 mmol/L (ref 135–145)
Total Bilirubin: 1 mg/dL (ref 0.3–1.2)
Total Protein: 7.4 g/dL (ref 6.5–8.1)

## 2015-03-12 LAB — CBC WITH DIFFERENTIAL/PLATELET
Basophils Absolute: 0 K/uL (ref 0.0–0.1)
Basophils Relative: 0 %
Eosinophils Absolute: 0.1 K/uL (ref 0.0–0.7)
Eosinophils Relative: 2 %
HCT: 39.1 % (ref 36.0–46.0)
Hemoglobin: 13.1 g/dL (ref 12.0–15.0)
Lymphocytes Relative: 27 %
Lymphs Abs: 1.6 K/uL (ref 0.7–4.0)
MCH: 30.5 pg (ref 26.0–34.0)
MCHC: 33.5 g/dL (ref 30.0–36.0)
MCV: 90.9 fL (ref 78.0–100.0)
Monocytes Absolute: 0.4 K/uL (ref 0.1–1.0)
Monocytes Relative: 7 %
Neutro Abs: 3.8 K/uL (ref 1.7–7.7)
Neutrophils Relative %: 64 %
Platelets: 195 K/uL (ref 150–400)
RBC: 4.3 MIL/uL (ref 3.87–5.11)
RDW: 12.1 % (ref 11.5–15.5)
WBC: 5.9 K/uL (ref 4.0–10.5)

## 2015-03-12 NOTE — Discharge Instructions (Signed)
Hypertension Hypertension is another name for high blood pressure. High blood pressure forces your heart to work harder to pump blood. A blood pressure reading has two numbers, which includes a higher number over a lower number (example: 110/72). HOME CARE   Have your blood pressure rechecked by your doctor.  Only take medicine as told by your doctor. Follow the directions carefully. The medicine does not work as well if you skip doses. Skipping doses also puts you at risk for problems.  Do not smoke.  Monitor your blood pressure at home as told by your doctor. GET HELP IF:  You think you are having a reaction to the medicine you are taking.  You have repeat headaches or feel dizzy.  You have puffiness (swelling) in your ankles.  You have trouble with your vision. GET HELP RIGHT AWAY IF:   You get a very bad headache and are confused.  You feel weak, numb, or faint.  You get chest or belly (abdominal) pain.  You throw up (vomit).  You cannot breathe very well. MAKE SURE YOU:   Understand these instructions.  Will watch your condition.  Will get help right away if you are not doing well or get worse.   This information is not intended to replace advice given to you by your health care provider. Make sure you discuss any questions you have with your health care provider.  Your blood pressure measurement today was a nonemergent value. However, follow up with your primary care provider for additional evaluations. If blood pressure remains elevated may warrant medication management in the future. Return to the emergency department if you experience severe headache, blurry vision, numbness or tingling in her extremities, chest pain, shortness of breath.

## 2015-03-12 NOTE — ED Notes (Signed)
Pt reports that she has checking her blood pressure multiple times in the past two days and found it to either not read or be high (160/108, 160/118)  She reports that the machine has worked on other people but sometimes not on her.  She also reports that her stomach is "bothering her but no pain".  She has not had an appetite but when she eats "just a little..my stomach feels really full"

## 2015-03-14 NOTE — ED Provider Notes (Signed)
CSN: [id number]     Arrival date & time 03/12/15  2025 History   First MD Initiated Contact with Patient 03/12/15 2032     Chief Complaint  Patient presents with  . Hypertension  . Abdominal Pain     (Consider location/radiation/quality/duration/timing/severity/associated sxs/prior Treatment) HPI   Becky Shaw is a 44 y.o F with no significant pmhx who presents to the ED concerned that her BP is elevated. Pt states that she was at her sister-in-laws house yesterday who has HTN and was checking her BP so the pt decided to check her own out of curiosity. Pt states that it read 220/180 and pt became concerned so she came to the ED to be evaluated. No hx of HTN. Denies HA, blurry vision, numbness, weakness, dysuria, abdominal pain, vomiting, chest pain, SOB, syncope.   Past Medical History  Diagnosis Date  . Abdominal pain, left lower quadrant 05/15/2007  . ALLERGIC RHINITIS 05/17/2007  . GANGLION CYST, WRIST, RIGHT 05/31/2008  . OVERACTIVE BLADDER 05/15/2007  . OAB (overactive bladder)    Past Surgical History  Procedure Laterality Date  . Cholecystectomy     Family History  Problem Relation Age of Onset  . Stroke Other   . Hypertension Other    Social History  Substance Use Topics  . Smoking status: Never Smoker   . Smokeless tobacco: None  . Alcohol Use: Yes   OB History    No data available     Review of Systems  All other systems reviewed and are negative.     Allergies  Cetirizine hcl  Home Medications   Prior to Admission medications   Medication Sig Start Date End Date Taking? Authorizing Provider  Cholecalciferol (VITAMIN D3) 5000 UNITS TABS Take 1 tablet by mouth daily.   Yes Historical Provider, MD  Multiple Vitamin (MULTIVITAMIN WITH MINERALS) TABS tablet Take 1 tablet by mouth daily.   Yes Historical Provider, MD  cephALEXin (KEFLEX) 500 MG capsule Take 1 capsule (500 mg total) by mouth 4 (four) times daily. Patient not taking: Reported on 10/31/2014  02/23/14   Corwin Levins, MD  ondansetron (ZOFRAN) 4 MG tablet Take 1 tablet (4 mg total) by mouth every 8 (eight) hours as needed for nausea or vomiting. Patient not taking: Reported on 03/12/2015 02/23/14   Corwin Levins, MD   BP 146/81 mmHg  Pulse 75  Temp(Src) 98.4 F (36.9 C) (Oral)  Resp 16  Ht  (1.6 m)  Wt 61.236 kg  BMI 23.92 kg/m2  SpO2 98% Physical Exam  Constitutional: She is oriented to person, place, and time. She appears well-developed and well-nourished. No distress.  HENT:  Head: Normocephalic and atraumatic.  Mouth/Throat: No oropharyngeal exudate.  Eyes: Conjunctivae and EOM are normal. Pupils are equal, round, and reactive to light. Right eye exhibits no discharge. Left eye exhibits no discharge. No scleral icterus.  Neck: Neck supple. No JVD present.  Cardiovascular: Normal rate, regular rhythm, normal heart sounds and intact distal pulses.  Exam reveals no gallop and no friction rub.   No murmur heard. Pulmonary/Chest: Effort normal and breath sounds normal. No respiratory distress. She has no wheezes. She has no rales. She exhibits no tenderness.  Abdominal: Soft. She exhibits no distension. There is no tenderness. There is no guarding.  Musculoskeletal: Normal range of motion. She exhibits no edema.  Neurological: She is alert and oriented to person, place, and time. No cranial nerve deficit.  Strength 5/5 throughout. No sensory deficits.  No gait  abnormality.  Skin: Skin is warm and dry. No rash noted. She is not diaphoretic. No erythema. No pallor.  Psychiatric: She has a normal mood and affect. Her behavior is normal.  Nursing note and vitals reviewed.   ED Course  Procedures (including critical care time) Labs Review Labs Reviewed  COMPREHENSIVE METABOLIC PANEL - Abnormal; Notable for the following:    Potassium 3.4 (*)    Glucose, Bld 107 (*)    All other components within normal limits  CBC WITH DIFFERENTIAL/PLATELET    Imaging Review No  results found. I have personally reviewed and evaluated these images and lab results as part of my medical decision-making.   EKG Interpretation None      MDM   Final diagnoses:  Secondary hypertension, unspecified   Otherwise healthy 44 y.o F presents for concern of HTN. Pt notes that her BP was elevated when she checked it on a home monitor yesterday.  In ED, BP is 146/81. No other complaints. No evidence of end organ damage. All lab work wnl. No chest pain, HA, blurry vision.  No signs of hypertensive urgency. Pt is hemodynamically stable. Discussed with pt close follow up with PCP for re-evaluation. If BP remains elevated may warrant medication management in the future.      Lester KinsmanSamantha Tripp [place], PA-C 03/14/15 1756  Alvira MondayErin Schlossman, MD 03/16/15 [phone]

## 2015-03-22 ENCOUNTER — Ambulatory Visit (INDEPENDENT_AMBULATORY_CARE_PROVIDER_SITE_OTHER): Payer: PRIVATE HEALTH INSURANCE | Admitting: Internal Medicine

## 2015-03-22 ENCOUNTER — Encounter: Payer: Self-pay | Admitting: Internal Medicine

## 2015-03-22 VITALS — BP 134/92 | HR 84 | Temp 98.5°F | Ht 63.0 in | Wt 135.0 lb

## 2015-03-22 DIAGNOSIS — Z0000 Encounter for general adult medical examination without abnormal findings: Secondary | ICD-10-CM

## 2015-03-22 DIAGNOSIS — I10 Essential (primary) hypertension: Secondary | ICD-10-CM | POA: Insufficient documentation

## 2015-03-22 DIAGNOSIS — R030 Elevated blood-pressure reading, without diagnosis of hypertension: Secondary | ICD-10-CM

## 2015-03-22 DIAGNOSIS — IMO0001 Reserved for inherently not codable concepts without codable children: Secondary | ICD-10-CM

## 2015-03-22 DIAGNOSIS — Z0189 Encounter for other specified special examinations: Secondary | ICD-10-CM | POA: Diagnosis not present

## 2015-03-22 HISTORY — DX: Essential (primary) hypertension: I10

## 2015-03-22 NOTE — Progress Notes (Signed)
Subjective:    Patient ID: Becky Shaw, female    DOB: 18-Mar-1971, 44 y.o.   MRN: [id number]  HPI  Here for wellness and f/u;  Overall doing ok;  Pt denies Chest pain, worsening SOB, DOE, wheezing, orthopnea, PND, worsening LE edema, palpitations, dizziness or syncope.  Pt denies neurological change such as new headache, facial or extremity weakness.  Pt denies polydipsia, polyuria, or low sugar symptoms. Pt states overall good compliance with treatment and medications, good tolerability, and has been trying to follow appropriate diet.  Pt denies worsening depressive symptoms, suicidal ideation or panic. No fever, night sweats, wt loss, loss of appetite, or other constitutional symptoms.  Pt states good ability with ADL's, has low fall risk, home safety reviewed and adequate, no other significant changes in hearing or vision, and only occasionally active with exercise.  Declines flu shot.  No current complaints  Has had mild elev SBP per pt only since starting taking OTC supplements including mult herbals and DHEA BP Readings from Last 3 Encounters:  03/22/15 134/92  03/12/15 146/81  11/02/14 116/75   Past Medical History  Diagnosis Date  . Abdominal pain, left lower quadrant 05/15/2007  . ALLERGIC RHINITIS 05/17/2007  . GANGLION CYST, WRIST, RIGHT 05/31/2008  . OVERACTIVE BLADDER 05/15/2007  . OAB (overactive bladder)    Past Surgical History  Procedure Laterality Date  . Cholecystectomy      reports that she has never smoked. She does not have any smokeless tobacco history on file. She reports that she drinks alcohol. She reports that she does not use illicit drugs. family history includes Hypertension in her other; Stroke in her other. Allergies  Allergen Reactions  . Cetirizine Hcl     REACTION: insomnia   Current Outpatient Prescriptions on File Prior to Visit  Medication Sig Dispense Refill  . Cholecalciferol (VITAMIN D3) 5000 UNITS TABS Take 1 tablet by mouth daily. Reported on  03/22/2015    . Multiple Vitamin (MULTIVITAMIN WITH MINERALS) TABS tablet Take 1 tablet by mouth daily. Reported on 03/22/2015     No current facility-administered medications on file prior to visit.    Review of Systems Constitutional: Negative for increased diaphoresis, other activity, appetite or siginficant weight change other than noted HENT: Negative for worsening hearing loss, ear pain, facial swelling, mouth sores and neck stiffness.   Eyes: Negative for other worsening pain, redness or visual disturbance.  Respiratory: Negative for shortness of breath and wheezing  Cardiovascular: Negative for chest pain and palpitations.  Gastrointestinal: Negative for diarrhea, blood in stool, abdominal distention or other pain Genitourinary: Negative for hematuria, flank pain or change in urine volume.  Musculoskeletal: Negative for myalgias or other joint complaints.  Skin: Negative for color change and wound or drainage.  Neurological: Negative for syncope and numbness. other than noted Hematological: Negative for adenopathy. or other swelling Psychiatric/Behavioral: Negative for hallucinations, SI, self-injury, decreased concentration or other worsening agitation.      Objective:   Physical Exam BP 134/92 mmHg  Pulse 84  Temp(Src) 98.5 F (36.9 C) (Oral)  Ht  (1.6 m)  Wt 135 lb (61.236 kg)  BMI 23.92 kg/m2  SpO2 97% VS noted,  Constitutional: Pt is oriented to person, place, and time. Appears well-developed and well-nourished, in no significant distress Head: Normocephalic and atraumatic.  Right Ear: External ear normal.  Left Ear: External ear normal.  Nose: Nose normal.  Mouth/Throat: Oropharynx is clear and moist.  Eyes: Conjunctivae and EOM are normal. Pupils are  equal, round, and reactive to light.  Neck: Normal range of motion. Neck supple. No JVD present. No tracheal deviation present or significant neck LA or mass Cardiovascular: Normal rate, regular rhythm, normal  heart sounds and intact distal pulses.   Pulmonary/Chest: Effort normal and breath sounds without rales or wheezing  Abdominal: Soft. Bowel sounds are normal. NT. No HSM  Musculoskeletal: Normal range of motion. Exhibits no edema.  Lymphadenopathy:  Has no cervical adenopathy.  Neurological: Pt is alert and oriented to person, place, and time. Pt has normal reflexes. No cranial nerve deficit. Motor grossly intact Skin: Skin is warm and dry. No rash noted.  Psychiatric:  Has normal mood and affect. Behavior is normal.     Assessment & Plan:

## 2015-03-22 NOTE — Patient Instructions (Addendum)
Please stop your supplements with the DHEA, as well as the bottle with the herbal supplements  Please continue all other medications as before, and refills have been done if requested.  Please have the pharmacy call with any other refills you may need.  Please continue your efforts at being more active, low cholesterol diet, and weight control.  Please keep your appointments with your specialists as you may have planned  Please return in 3 months, or sooner if needed, with Lab testing done 3-5 days before

## 2015-03-22 NOTE — Progress Notes (Signed)
Pre visit review using our clinic review tool, if applicable. No additional management support is needed unless otherwise documented below in the visit note. 

## 2015-03-23 NOTE — Assessment & Plan Note (Addendum)
Mild, unclear regarding new onset HTN, urged to be more active, wt loss, low salt diet, cont to monitor BP at home, and next visitm stop supplements with herbals and DHEA BP Readings from Last 3 Encounters:  03/22/15 134/92  03/12/15 146/81  11/02/14 116/75

## 2015-03-23 NOTE — Assessment & Plan Note (Signed)

## 2015-06-19 ENCOUNTER — Other Ambulatory Visit (INDEPENDENT_AMBULATORY_CARE_PROVIDER_SITE_OTHER): Payer: PRIVATE HEALTH INSURANCE

## 2015-06-19 DIAGNOSIS — Z0189 Encounter for other specified special examinations: Secondary | ICD-10-CM | POA: Diagnosis not present

## 2015-06-19 DIAGNOSIS — Z0000 Encounter for general adult medical examination without abnormal findings: Secondary | ICD-10-CM

## 2015-06-19 LAB — URINALYSIS, ROUTINE W REFLEX MICROSCOPIC
Bilirubin Urine: NEGATIVE
Ketones, ur: NEGATIVE
NITRITE: NEGATIVE
Specific Gravity, Urine: 1.025 (ref 1.000–1.030)
TOTAL PROTEIN, URINE-UPE24: NEGATIVE
URINE GLUCOSE: NEGATIVE
Urobilinogen, UA: 0.2 (ref 0.0–1.0)
pH: 6 (ref 5.0–8.0)

## 2015-06-19 LAB — BASIC METABOLIC PANEL
BUN: 16 mg/dL (ref 6–23)
CHLORIDE: 105 meq/L (ref 96–112)
CO2: 26 mEq/L (ref 19–32)
CREATININE: 0.69 mg/dL (ref 0.40–1.20)
Calcium: 9.6 mg/dL (ref 8.4–10.5)
GFR: 98.2 mL/min (ref >60.00)
GLUCOSE: 108 mg/dL — AB (ref 70–99)
POTASSIUM: 3.9 meq/L (ref 3.5–5.1)
Sodium: 138 mEq/L (ref 135–145)

## 2015-06-19 LAB — CBC WITH DIFFERENTIAL/PLATELET
BASOS PCT: 0.6 % (ref 0.0–3.0)
Basophils Absolute: 0 10*3/uL (ref 0.0–0.1)
EOS ABS: 0.1 10*3/uL (ref 0.0–0.7)
EOS PCT: 1.1 % (ref 0.0–5.0)
HEMATOCRIT: 41.5 % (ref 36.0–46.0)
HEMOGLOBIN: 14.3 g/dL (ref 12.0–15.0)
LYMPHS PCT: 27 % (ref 12.0–46.0)
Lymphs Abs: 1.8 10*3/uL (ref 0.7–4.0)
MCHC: 34.3 g/dL (ref 30.0–36.0)
MCV: 88.3 fl (ref 78.0–100.0)
MONOS PCT: 5.8 % (ref 3.0–12.0)
Monocytes Absolute: 0.4 10*3/uL (ref 0.1–1.0)
NEUTROS ABS: 4.3 10*3/uL (ref 1.4–7.7)
Neutrophils Relative %: 65.5 % (ref 43.0–77.0)
PLATELETS: 248 10*3/uL (ref 150.0–400.0)
RBC: 4.7 Mil/uL (ref 3.87–5.11)
RDW: 12.6 % (ref 11.5–15.5)
WBC: 6.6 10*3/uL (ref 4.0–10.5)

## 2015-06-19 LAB — LIPID PANEL
CHOL/HDL RATIO: 4
CHOLESTEROL: 188 mg/dL (ref 0–200)
HDL: 43.9 mg/dL (ref >39.00)
LDL CALC: 114 mg/dL — AB (ref 0–99)
NonHDL: 143.69
TRIGLYCERIDES: 149 mg/dL (ref 0.0–149.0)
VLDL: 29.8 mg/dL (ref 0.0–40.0)

## 2015-06-19 LAB — TSH: TSH: 1.41 u[IU]/mL (ref 0.35–4.50)

## 2015-06-19 LAB — HEPATIC FUNCTION PANEL
ALT: 19 U/L (ref 0–35)
AST: 15 U/L (ref 0–37)
Albumin: 4.4 g/dL (ref 3.5–5.2)
Alkaline Phosphatase: 46 U/L (ref 39–117)
BILIRUBIN DIRECT: 0.1 mg/dL (ref 0.0–0.3)
BILIRUBIN TOTAL: 0.9 mg/dL (ref 0.2–1.2)
Total Protein: 7.8 g/dL (ref 6.0–8.3)

## 2015-06-20 ENCOUNTER — Encounter: Payer: Self-pay | Admitting: Internal Medicine

## 2015-06-20 ENCOUNTER — Ambulatory Visit (INDEPENDENT_AMBULATORY_CARE_PROVIDER_SITE_OTHER): Payer: PRIVATE HEALTH INSURANCE | Admitting: Internal Medicine

## 2015-06-20 VITALS — BP 118/70 | HR 91 | Temp 98.6°F | Resp 20 | Wt 135.0 lb

## 2015-06-20 DIAGNOSIS — Z0000 Encounter for general adult medical examination without abnormal findings: Secondary | ICD-10-CM

## 2015-06-20 NOTE — Progress Notes (Signed)
Pre visit review using our clinic review tool, if applicable. No additional management support is needed unless otherwise documented below in the visit note. 

## 2015-06-20 NOTE — Progress Notes (Signed)
Subjective:    Patient ID: Blanchard KelchMainyoua Turton, female    DOB: [birth year], 45 y.o.   MRN: [id number]  HPI  Here for wellness and f/u;  Overall doing ok;  Pt denies Chest pain, worsening SOB, DOE, wheezing, orthopnea, PND, worsening LE edema, palpitations, dizziness or syncope.  Pt denies neurological change such as new headache, facial or extremity weakness.  Pt denies polydipsia, polyuria, or low sugar symptoms. Pt states overall good compliance with treatment and medications, good tolerability, and has been trying to follow appropriate diet.  Pt denies worsening depressive symptoms, suicidal ideation or panic. No fever, night sweats, wt loss, loss of appetite, or other constitutional symptoms.  Pt states good ability with ADL's, has low fall risk, home safety reviewed and adequate, no other significant changes in hearing or vision, and only occasionally active with exercise.  Many stressors recent, niece with brain ca, other family member died, then mother fell with leg fracture to the ICU, and bleeding gastric ulcer requiring surgury, appetite lower and would have thought she had lost wt but has not, mother now in SNF.Not taking meds for BP, has been working on better diet: BP Readings from Last 3 Encounters:  06/20/15 118/70  03/22/15 134/92  03/12/15 146/81     Wt Readings from Last 3 Encounters:  06/20/15 135 lb (61.236 kg)  03/22/15 135 lb (61.236 kg)  03/12/15 135 lb (61.236 kg)   Past Medical History  Diagnosis Date  . Abdominal pain, left lower quadrant 05/15/2007  . ALLERGIC RHINITIS 05/17/2007  . GANGLION CYST, WRIST, RIGHT 05/31/2008  . OVERACTIVE BLADDER 05/15/2007  . OAB (overactive bladder)    Past Surgical History  Procedure Laterality Date  . Cholecystectomy      reports that she has never smoked. She does not have any smokeless tobacco history on file. She reports that she drinks alcohol. She reports that she does not use illicit drugs. family history includes Hypertension in  her other; Stroke in her other. Allergies  Allergen Reactions  . Cetirizine Hcl     REACTION: insomnia   No current outpatient prescriptions on file prior to visit.   No current facility-administered medications on file prior to visit.    Review of Systems Constitutional: Negative for increased diaphoresis, or other activity, appetite or siginficant weight change other than noted HENT: Negative for worsening hearing loss, ear pain, facial swelling, mouth sores and neck stiffness.   Eyes: Negative for other worsening pain, redness or visual disturbance.  Respiratory: Negative for choking or stridor Cardiovascular: Negative for other chest pain and palpitations.  Gastrointestinal: Negative for worsening diarrhea, blood in stool, or abdominal distention Genitourinary: Negative for hematuria, flank pain or change in urine volume.  Musculoskeletal: Negative for myalgias or other joint complaints.  Skin: Negative for other color change and wound or drainage.  Neurological: Negative for syncope and numbness. other than noted Hematological: Negative for adenopathy. or other swelling Psychiatric/Behavioral: Negative for hallucinations, SI, self-injury, decreased concentration or other worsening agitation.      Objective:   Physical Exam BP 118/70 mmHg  Pulse 91  Temp(Src) 98.6 F (37 C) (Oral)  Resp 20  Wt 135 lb (61.236 kg)  SpO2 95% VS noted,  Constitutional: Pt is oriented to person, place, and time. Appears well-developed and well-nourished, in no significant distress Head: Normocephalic and atraumatic  Eyes: Conjunctivae and EOM are normal. Pupils are equal, round, and reactive to light Right Ear: External ear normal.  Left Ear: External ear normal Nose: Nose  normal.  Mouth/Throat: Oropharynx is clear and moist  Neck: Normal range of motion. Neck supple. No JVD present. No tracheal deviation present or significant neck LA or mass Cardiovascular: Normal rate, regular rhythm,  normal heart sounds and intact distal pulses.   Pulmonary/Chest: Effort normal and breath sounds without rales or wheezing  Abdominal: Soft. Bowel sounds are normal. NT. No HSM  Musculoskeletal: Normal range of motion. Exhibits no edema Lymphadenopathy: Has no cervical adenopathy.  Neurological: Pt is alert and oriented to person, place, and time. Pt has normal reflexes. No cranial nerve deficit. Motor grossly intact Skin: Skin is warm and dry. No rash noted or new ulcers Psychiatric:  Has normal mood and affect. Behavior is normal.     Assessment & Plan:

## 2015-06-20 NOTE — Patient Instructions (Signed)
Please continue all other medications as before, and refills have been done if requested.  Please have the pharmacy call with any other refills you may need.  Please continue your efforts at being more active, low cholesterol diet, and weight control.  You are otherwise up to date with prevention measures today.  Please keep your appointments with your specialists as you may have planned  Please return in 1 year for your yearly visit, or sooner if needed, with Lab testing done 3-5 days before  

## 2015-06-23 NOTE — Assessment & Plan Note (Signed)

## 2015-08-07 ENCOUNTER — Telehealth: Payer: Self-pay | Admitting: *Deleted

## 2015-08-07 NOTE — Telephone Encounter (Signed)
Pt left msg on triage stating she is going on a cruise 5/30, and wanting MD to rx some sea sickness patches. MD out of office today will hold for his return tomorrow...Raechel Chute/lmb

## 2015-08-08 MED ORDER — SCOPOLAMINE 1 MG/3DAYS TD PT72: 1.0000 | MEDICATED_PATCH | TRANSDERMAL | Status: DC

## 2015-08-08 NOTE — Telephone Encounter (Signed)
Ok, done erx 

## 2015-08-09 NOTE — Telephone Encounter (Signed)
Notified pt MD sent rxto [facility].../lmb 

## 2015-10-18 ENCOUNTER — Encounter: Payer: Self-pay | Admitting: Internal Medicine

## 2015-10-18 ENCOUNTER — Ambulatory Visit: Payer: PRIVATE HEALTH INSURANCE | Admitting: Internal Medicine

## 2015-10-18 ENCOUNTER — Ambulatory Visit (INDEPENDENT_AMBULATORY_CARE_PROVIDER_SITE_OTHER): Payer: PRIVATE HEALTH INSURANCE | Admitting: Internal Medicine

## 2015-10-18 VITALS — BP 122/72 | HR 85 | Temp 98.2°F | Resp 20 | Wt 134.0 lb

## 2015-10-18 DIAGNOSIS — J309 Allergic rhinitis, unspecified: Secondary | ICD-10-CM | POA: Diagnosis not present

## 2015-10-18 DIAGNOSIS — L509 Urticaria, unspecified: Secondary | ICD-10-CM | POA: Insufficient documentation

## 2015-10-18 DIAGNOSIS — L989 Disorder of the skin and subcutaneous tissue, unspecified: Secondary | ICD-10-CM | POA: Diagnosis not present

## 2015-10-18 MED ORDER — TRIAMCINOLONE ACETONIDE 0.1 % EX CREA: 1.0000 "application " | TOPICAL_CREAM | Freq: Two times a day (BID) | CUTANEOUS | 0 refills | Status: DC

## 2015-10-18 MED ORDER — PREDNISONE 10 MG PO TABS: ORAL_TABLET | ORAL | 0 refills | Status: DC

## 2015-10-18 MED ORDER — METHYLPREDNISOLONE ACETATE 80 MG/ML IJ SUSP
80.0000 mg | Freq: Once | INTRAMUSCULAR | Status: AC
  Administered 2015-10-18: 80 mg via INTRAMUSCULAR

## 2015-10-18 NOTE — Progress Notes (Signed)
Pre visit review using our clinic review tool, if applicable. No additional management support is needed unless otherwise documented below in the visit note. 

## 2015-10-18 NOTE — Progress Notes (Signed)
Subjective:    Patient ID: Becky Shaw, female    DOB: December 14, 1970, 45 y.o.   MRN: [id number]  HPI  Here to f/u with acute c/o 4 mo ongoing recurrent rash and itching, c/w recurring wheals and flares, intermittent, mild to start, to arms/legs/torso mainly without other lip or tongue swelling. Pt denies chest pain, increased sob or doe, wheezing, orthopnea, PND, increased LE swelling, palpitations, dizziness or syncope.  No prior hx of similar rash, although has seen allergy in past with + skin testing to grasses and trees she thinks but this was about 15 yrs ago.   Pt denies fever, wt loss, night sweats, loss of appetite, or other constitutional symptoms  Benadryl has helped and took a dose prior to visit today, no rash currently on exam.  Nothing else seems to make better or worse.  Does have several wks ongoing nasal allergy symptoms with clearish congestion, itch and sneezing, without fever, pain, ST, cough.  Also has a small lesion just post to the left pinna slightly raised and faint erythema but nontender, non draining,  Past Medical History:  Diagnosis Date  . Abdominal pain, left lower quadrant 05/15/2007  . ALLERGIC RHINITIS 05/17/2007  . GANGLION CYST, WRIST, RIGHT 05/31/2008  . OAB (overactive bladder)   . OVERACTIVE BLADDER 05/15/2007   Past Surgical History:  Procedure Laterality Date  . CHOLECYSTECTOMY      reports that she has never smoked. She does not have any smokeless tobacco history on file. She reports that she drinks alcohol. She reports that she does not use drugs. family history includes Hypertension in her other; Stroke in her other. Allergies  Allergen Reactions  . Cetirizine Hcl     REACTION: insomnia   Current Outpatient Prescriptions on File Prior to Visit  Medication Sig Dispense Refill  . scopolamine (TRANSDERM-SCOP, 1.5 MG,) 1 MG/3DAYS Place 1 patch (1.5 mg total) onto the skin every 3 (three) days. 10 patch 1   No current facility-administered medications on  file prior to visit.     Review of Systems  Constitutional: Negative for unusual diaphoresis or night sweats HENT: Negative for ear swelling or discharge Eyes: Negative for worsening visual haziness  Respiratory: Negative for choking and stridor.   Gastrointestinal: Negative for distension or worsening eructation Genitourinary: Negative for retention or change in urine volume.  Musculoskeletal: Negative for other MSK pain or swelling Skin: Negative for color change and worsening wound Neurological: Negative for tremors and numbness other than noted  Psychiatric/Behavioral: Negative for decreased concentration or agitation other than above       Objective:   Physical Exam BP 122/72   Pulse 85   Temp 98.2 F (36.8 C) (Oral)   Resp 20   Wt 134 lb (60.8 kg)   SpO2 97%   BMI 23.74 kg/m  VS noted,  Constitutional: Pt appears in no apparent distress HENT: Head: NCAT.  Right Ear: External ear normal.  Left Ear: External ear normal.  Eyes: . Pupils are equal, round, and reactive to light. Conjunctivae and EOM are normal Neck: Normal range of motion. Neck supple.  Cardiovascular: Normal rate and regular rhythm.   Pulmonary/Chest: Effort normal and breath sounds without rales or wheezing.  Abd:  Soft, NT, ND, + BS Neurological: Pt is alert. Not confused , motor grossly intact Skin: Skin is warm. No rash, no LE edema, has a 10 mm raised nontender slightly erythem nondraining skin nodular lesion Psychiatric: Pt behavior is normal. No agitation.  Assessment & Plan:

## 2015-10-18 NOTE — Patient Instructions (Signed)
You had the steroid shot today  Please take all new medication as prescribed - the prednisone, and the steroid cream if needed  You can also take Benadryl 50 mg by mouth (OTC) every 6 hrs as needed  Please continue all other medications as before, and refills have been done if requested.  Please have the pharmacy call with any other refills you may need.  Please keep your appointments with your specialists as you may have planned  Please call if you would want the blood testing for allergies, or allergy referral

## 2015-10-22 DIAGNOSIS — L989 Disorder of the skin and subcutaneous tissue, unspecified: Secondary | ICD-10-CM | POA: Insufficient documentation

## 2015-10-22 NOTE — Assessment & Plan Note (Signed)
Post left pinna,etiology unclear, likely benign, ? Cyst and small angioedema vs other, for triam cream prn, declines derm referral for now, pt to call if would want this or any worsening pain, size, swelling or drainage

## 2015-10-22 NOTE — Assessment & Plan Note (Signed)
Also to improve with tx today, as well as OTC med such as antihist and nasal steroid prn,  to f/u any worsening symptoms or concerns

## 2015-10-22 NOTE — Assessment & Plan Note (Addendum)
Ongoing not worsening but at least mild to mod persistent for 4 mok none currently on exam but still itching today despite benadaryl, for depomedrol IM, predpac asd, benadryl 50 mg prn; consider allergy referral

## 2016-04-30 ENCOUNTER — Ambulatory Visit: Payer: PRIVATE HEALTH INSURANCE | Admitting: Internal Medicine

## 2016-05-01 ENCOUNTER — Other Ambulatory Visit (INDEPENDENT_AMBULATORY_CARE_PROVIDER_SITE_OTHER): Payer: PRIVATE HEALTH INSURANCE

## 2016-05-01 ENCOUNTER — Ambulatory Visit (INDEPENDENT_AMBULATORY_CARE_PROVIDER_SITE_OTHER): Payer: PRIVATE HEALTH INSURANCE | Admitting: Nurse Practitioner

## 2016-05-01 VITALS — BP 140/82 | HR 90 | Temp 98.9°F | Ht 62.0 in | Wt 133.0 lb

## 2016-05-01 DIAGNOSIS — R1032 Left lower quadrant pain: Secondary | ICD-10-CM

## 2016-05-01 DIAGNOSIS — R030 Elevated blood-pressure reading, without diagnosis of hypertension: Secondary | ICD-10-CM | POA: Diagnosis not present

## 2016-05-01 LAB — URINALYSIS, ROUTINE W REFLEX MICROSCOPIC
Bilirubin Urine: NEGATIVE
Hgb urine dipstick: NEGATIVE
KETONES UR: NEGATIVE
Leukocytes, UA: NEGATIVE
Nitrite: NEGATIVE
PH: 5.5 (ref 5.0–8.0)
RBC / HPF: NONE SEEN (ref 0–?)
Specific Gravity, Urine: 1.01 (ref 1.000–1.030)
TOTAL PROTEIN, URINE-UPE24: NEGATIVE
URINE GLUCOSE: NEGATIVE
UROBILINOGEN UA: 0.2 (ref 0.0–1.0)

## 2016-05-01 NOTE — Progress Notes (Signed)
Subjective:  Patient ID: Becky Shaw, female    DOB: 05-12-1970  Age: 46 y.o. MRN: [id number]  CC: Abdominal Pain (pain on lower left abdominal,sharp pain for 3 weeks. took pepto bismole)   Abdominal Pain  This is a new problem. The current episode started 1 to 4 weeks ago. The onset quality is gradual. The problem occurs intermittently. The problem has been waxing and waning. The pain is located in the LLQ. The quality of the pain is colicky and sharp. The abdominal pain does not radiate. Associated symptoms include flatus. Pertinent negatives include no anorexia, belching, constipation, diarrhea, dysuria, fever, frequency, headaches, hematochezia, hematuria, melena, myalgias, nausea, vomiting or weight loss. Nothing aggravates the pain. The pain is relieved by passing flatus. She has tried antacids for the symptoms. The treatment provided significant relief. There is no history of abdominal surgery, colon cancer, Crohn's disease, gallstones, GERD, irritable bowel syndrome, pancreatitis, PUD or ulcerative colitis.    ABD pain: LLQ, ongoing for 3weeks, intermittent, associated with bloating Some improvement with peptobismol.  Outpatient Medications Prior to Visit  Medication Sig Dispense Refill  . predniSONE (DELTASONE) 10 MG tablet 3 tabs by mouth per day for 3 days,2tabs per day for 3 days,1tab per day for 3 days (Patient not taking: Reported on 05/01/2016) 18 tablet 0  . scopolamine (TRANSDERM-SCOP, 1.5 MG,) 1 MG/3DAYS Place 1 patch (1.5 mg total) onto the skin every 3 (three) days. (Patient not taking: Reported on 05/01/2016) 10 patch 1  . triamcinolone cream (KENALOG) 0.1 % Apply 1 application topically 2 (two) times daily. (Patient not taking: Reported on 05/01/2016) 30 g 0   No facility-administered medications prior to visit.     ROS See HPI  Objective:  BP 140/82   Pulse 90   Temp 98.9 F (37.2 C)   Ht 5\' 2"  (1.575 m)   Wt 133 lb (60.3 kg)   SpO2 98%   BMI 24.33 kg/m   BP  Readings from Last 3 Encounters:  05/01/16 140/82  10/18/15 122/72  06/20/15 118/70    Wt Readings from Last 3 Encounters:  05/01/16 133 lb (60.3 kg)  10/18/15 134 lb (60.8 kg)  06/20/15 135 lb (61.2 kg)    Physical Exam  Constitutional: She is oriented to person, place, and time. No distress.  HENT:  Right Ear: External ear normal.  Left Ear: External ear normal.  Nose: Nose normal.  Mouth/Throat: No oropharyngeal exudate.  Neck: Normal range of motion. Neck supple.  Cardiovascular: Normal rate, regular rhythm and normal heart sounds.   Pulmonary/Chest: Effort normal and breath sounds normal. No respiratory distress.  Abdominal: Soft. She exhibits no distension and no mass. There is no tenderness. There is no rebound and no guarding.  Musculoskeletal: She exhibits no edema.  Neurological: She is alert and oriented to person, place, and time.  Skin: Skin is warm and dry.  Psychiatric: She has a normal mood and affect. Her behavior is normal.  Vitals reviewed.   Lab Results  Component Value Date   WBC 6.6 06/19/2015   HGB 14.3 06/19/2015   HCT 41.5 06/19/2015   PLT 248.0 06/19/2015   GLUCOSE 108 (H) 06/19/2015   CHOL 188 06/19/2015   TRIG 149.0 06/19/2015   HDL 43.90 06/19/2015   LDLCALC 114 (H) 06/19/2015   ALT 19 06/19/2015   AST 15 06/19/2015   NA 138 06/19/2015   K 3.9 06/19/2015   CL 105 06/19/2015   CREATININE 0.69 06/19/2015   BUN 16 06/19/2015  CO2 26 06/19/2015   TSH 1.41 06/19/2015    No results found.  Assessment & Plan:   Becky Shaw was seen today for abdominal pain.  Diagnoses and all orders for this visit:  Colicky LLQ abdominal pain -     Urinalysis, Routine w reflex microscopic; Future  Elevated BP without diagnosis of hypertension   I have discontinued Ms. Reish's scopolamine, predniSONE, and triamcinolone cream.  No orders of the defined types were placed in this encounter.  Follow-up: Return if symptoms worsen or fail to  improve.  Becky Pennaharlotte Ramyah Pankowski, NP

## 2016-05-01 NOTE — Patient Instructions (Addendum)
Check BP once a day and record.  Return to office if BP is persistently greater than 140/80 for the next 3days.  Normal urinalysis. Use probiotic OTC once a day.  Irritable Bowel Syndrome, Adult Irritable bowel syndrome (IBS) is not one specific disease. It is a group of symptoms that affects the organs responsible for digestion (gastrointestinal or GI tract). To regulate how your GI tract works, your body sends signals back and forth between your intestines and your brain. If you have IBS, there may be a problem with these signals. As a result, your GI tract does not function normally. Your intestines may become more sensitive and overreact to certain things. This is especially true when you eat certain foods or when you are under stress. There are four types of IBS. These may be determined based on the consistency of your stool:  IBS with diarrhea.  IBS with constipation.  Mixed IBS.  Unsubtyped IBS. It is important to know which type of IBS you have. Some treatments are more likely to be helpful for certain types of IBS. What are the causes? The exact cause of IBS is not known. What increases the risk? You may have a higher risk of IBS if:  You are a woman.  You are younger than 45 years old.  You have a family history of IBS.  You have mental health problems.  You have had bacterial infection of your GI tract. What are the signs or symptoms? Symptoms of IBS vary from person to person. The main symptom is abdominal pain or discomfort. Additional symptoms usually include one or more of the following:  Diarrhea, constipation, or both.  Abdominal swelling or bloating.  Feeling full or sick after eating a small or regular-size meal.  Frequent gas.  Mucus in the stool.  A feeling of having more stool left after a bowel movement. Symptoms tend to come and go. They may be associated with stress, psychiatric conditions, or nothing at all. How is this diagnosed? There is no  specific test to diagnose IBS. Your health care provider will make a diagnosis based on a physical exam, medical history, and your symptoms. You may have other tests to rule out other conditions that may be causing your symptoms. These may include:  Blood tests.  X-rays.  CT scan.  Endoscopy and colonoscopy. This is a test in which your GI tract is viewed with a long, thin, flexible tube. How is this treated? There is no cure for IBS, but treatment can help relieve symptoms. IBS treatment often includes:  Changes to your diet, such as:  Eating more fiber.  Avoiding foods that cause symptoms.  Drinking more water.  Eating regular, medium-sized portioned meals.  Medicines. These may include:  Fiber supplements if you have constipation.  Medicine to control diarrhea (antidiarrheal medicines).  Medicine to help control muscle spasms in your GI tract (antispasmodic medicines).  Medicines to help with any mental health issues, such as antidepressants or tranquilizers.  Therapy.  Talk therapy may help with anxiety, depression, or other mental health issues that can make IBS symptoms worse.  Stress reduction.  Managing your stress can help keep symptoms under control. Follow these instructions at home:  Take medicines only as directed by your health care provider.  Eat a healthy diet.  Avoid foods and drinks with added sugar.  Include more whole grains, fruits, and vegetables gradually into your diet. This may be especially helpful if you have IBS with constipation.  Avoid any  foods and drinks that make your symptoms worse. These may include dairy products and caffeinated or carbonated drinks.  Do not eat large meals.  Drink enough fluid to keep your urine clear or pale yellow.  Exercise regularly. Ask your health care provider for recommendations of good activities for you.  Keep all follow-up visits as directed by your health care provider. This is  important. Contact a health care provider if:  You have constant pain.  You have trouble or pain with swallowing.  You have worsening diarrhea. Get help right away if:  You have severe and worsening abdominal pain.  You have diarrhea and:  You have a rash, stiff neck, or severe headache.  You are irritable, sleepy, or difficult to awaken.  You are weak, dizzy, or extremely thirsty.  You have bright red blood in your stool or you have black tarry stools.  You have unusual abdominal swelling that is painful.  You vomit continuously.  You vomit blood (hematemesis).  You have both abdominal pain and a fever. This information is not intended to replace advice given to you by your health care provider. Make sure you discuss any questions you have with your health care provider. Document Released: 03/11/2005 Document Revised: 08/11/2015 Document Reviewed: 11/26/2013 Elsevier Interactive Patient Education  2017 [employer].

## 2016-05-01 NOTE — Progress Notes (Signed)
Pre visit review using our clinic review tool, if applicable. No additional management support is needed unless otherwise documented below in the visit note. 

## 2016-05-02 ENCOUNTER — Encounter: Payer: Self-pay | Admitting: Nurse Practitioner

## 2016-05-09 ENCOUNTER — Encounter: Payer: Self-pay | Admitting: Nurse Practitioner

## 2016-05-09 ENCOUNTER — Ambulatory Visit (INDEPENDENT_AMBULATORY_CARE_PROVIDER_SITE_OTHER): Payer: PRIVATE HEALTH INSURANCE | Admitting: Nurse Practitioner

## 2016-05-09 VITALS — BP 152/108 | HR 79 | Temp 98.4°F | Ht 62.0 in | Wt 135.0 lb

## 2016-05-09 DIAGNOSIS — I10 Essential (primary) hypertension: Secondary | ICD-10-CM

## 2016-05-09 MED ORDER — AMLODIPINE BESYLATE 5 MG PO TABS: 5.0000 mg | ORAL_TABLET | Freq: Every day | ORAL | 3 refills | Status: DC

## 2016-05-09 NOTE — Progress Notes (Signed)
   Subjective:  Patient ID: Becky Shaw, female    DOB: [birth year]  Age: 46 y.o. MRN: [id number]  CC: Follow-up (BP follow up/ Tue 140/103, Wed 137/98, Thur 150/104. flu shot?)   Hypertension  This is a new problem. The current episode started 1 to 4 weeks ago. The problem is unchanged. The problem is uncontrolled. Pertinent negatives include no anxiety, blurred vision, chest pain, malaise/fatigue, neck pain, orthopnea, palpitations, peripheral edema, PND, shortness of breath or sweats. There are no associated agents to hypertension. There are no known risk factors for coronary artery disease. Past treatments include nothing.    No outpatient prescriptions prior to visit.   No facility-administered medications prior to visit.     ROS See HPI  Objective:  BP (!) 152/108   Pulse 79   Temp 98.4 F (36.9 C)   Ht 5\' 2"  (1.575 m)   Wt 135 lb (61.2 kg)   SpO2 98%   BMI 24.69 kg/m   BP Readings from Last 3 Encounters:  05/09/16 (!) 152/108  05/01/16 140/82  10/18/15 122/72    Wt Readings from Last 3 Encounters:  05/09/16 135 lb (61.2 kg)  05/01/16 133 lb (60.3 kg)  10/18/15 134 lb (60.8 kg)    Physical Exam  Constitutional: She is oriented to person, place, and time. No distress.  HENT:  Right Ear: External ear normal.  Left Ear: External ear normal.  Nose: Nose normal.  Mouth/Throat: No oropharyngeal exudate.  Eyes: No scleral icterus.  Neck: Normal range of motion. Neck supple. No thyromegaly present.  Cardiovascular: Normal rate, regular rhythm and normal heart sounds.   Pulmonary/Chest: Effort normal and breath sounds normal. No respiratory distress.  Abdominal: Soft. She exhibits no distension.  Musculoskeletal: Normal range of motion. She exhibits no edema.  Lymphadenopathy:    She has no cervical adenopathy.  Neurological: She is alert and oriented to person, place, and time.  Skin: Skin is warm and dry.  Psychiatric: She has a normal mood and affect. Her  behavior is normal.    Lab Results  Component Value Date   WBC 6.6 06/19/2015   HGB 14.3 06/19/2015   HCT 41.5 06/19/2015   PLT 248.0 06/19/2015   GLUCOSE 108 (H) 06/19/2015   CHOL 188 06/19/2015   TRIG 149.0 06/19/2015   HDL 43.90 06/19/2015   LDLCALC 114 (H) 06/19/2015   ALT 19 06/19/2015   AST 15 06/19/2015   NA 138 06/19/2015   K 3.9 06/19/2015   CL 105 06/19/2015   CREATININE 0.69 06/19/2015   BUN 16 06/19/2015   CO2 26 06/19/2015   TSH 1.41 06/19/2015    No results found.  Assessment & Plan:   Becky Shaw was seen today for follow-up.  Diagnoses and all orders for this visit:  Essential hypertension -     amLODipine (NORVASC) 5 MG tablet; Take 1 tablet (5 mg total) by mouth daily.   I am having Becky Shaw start on amLODipine.  Meds ordered this encounter  Medications  . amLODipine (NORVASC) 5 MG tablet    Sig: Take 1 tablet (5 mg total) by mouth daily.    Dispense:  30 tablet    Refill:  3    Order Specific Question:   Supervising Provider    Answer:   Tresa GarterPLOTNIKOV, ALEKSEI V [[id number]]    Follow-up: Return in about 4 weeks (around 06/06/2016) for HTN.  Alysia Pennaharlotte Cambria Osten, NP

## 2016-05-09 NOTE — Patient Instructions (Addendum)
Call office if Bp<100/70 or greater than 150/80 for more than 3days.  Hypertension Hypertension, commonly called high blood pressure, is when the force of blood pumping through your arteries is too strong. Your arteries are the blood vessels that carry blood from your heart throughout your body. A blood pressure reading consists of a higher number over a lower number, such as 110/72. The higher number (systolic) is the pressure inside your arteries when your heart pumps. The lower number (diastolic) is the pressure inside your arteries when your heart relaxes. Ideally you want your blood pressure below 120/80. Hypertension forces your heart to work harder to pump blood. Your arteries may become narrow or stiff. Having untreated or uncontrolled hypertension can cause heart attack, stroke, kidney disease, and other problems. What increases the risk? Some risk factors for high blood pressure are controllable. Others are not. Risk factors you cannot control include:  Race. You may be at higher risk if you are African American.  Age. Risk increases with age.  Gender. Men are at higher risk than women before age 80 years. After age 90, women are at higher risk than men. Risk factors you can control include:  Not getting enough exercise or physical activity.  Being overweight.  Getting too much fat, sugar, calories, or salt in your diet.  Drinking too much alcohol. What are the signs or symptoms? Hypertension does not usually cause signs or symptoms. Extremely high blood pressure (hypertensive crisis) may cause headache, anxiety, shortness of breath, and nosebleed. How is this diagnosed? To check if you have hypertension, your health care provider will measure your blood pressure while you are seated, with your arm held at the level of your heart. It should be measured at least twice using the same arm. Certain conditions can cause a difference in blood pressure between your right and left arms. A  blood pressure reading that is higher than normal on one occasion does not mean that you need treatment. If it is not clear whether you have high blood pressure, you may be asked to return on a different day to have your blood pressure checked again. Or, you may be asked to monitor your blood pressure at home for 1 or more weeks. How is this treated? Treating high blood pressure includes making lifestyle changes and possibly taking medicine. Living a healthy lifestyle can help lower high blood pressure. You may need to change some of your habits. Lifestyle changes may include:  Following the DASH diet. This diet is high in fruits, vegetables, and whole grains. It is low in salt, red meat, and added sugars.  Keep your sodium intake below 2,300 mg per day.  Getting at least 30-45 minutes of aerobic exercise at least 4 times per week.  Losing weight if necessary.  Not smoking.  Limiting alcoholic beverages.  Learning ways to reduce stress. Your health care provider may prescribe medicine if lifestyle changes are not enough to get your blood pressure under control, and if one of the following is true:  You are 4-60 years of age and your systolic blood pressure is above 140.  You are 57 years of age or older, and your systolic blood pressure is above 150.  Your diastolic blood pressure is above 90.  You have diabetes, and your systolic blood pressure is over 140 or your diastolic blood pressure is over 90.  You have kidney disease and your blood pressure is above 140/90.  You have heart disease and your blood pressure is  above 140/90. Your personal target blood pressure may vary depending on your medical conditions, your age, and other factors. Follow these instructions at home:  Have your blood pressure rechecked as directed by your health care provider.  Take medicines only as directed by your health care provider. Follow the directions carefully. Blood pressure medicines must be  taken as prescribed. The medicine does not work as well when you skip doses. Skipping doses also puts you at risk for problems.  Do not smoke.  Monitor your blood pressure at home as directed by your health care provider. Contact a health care provider if:  You think you are having a reaction to medicines taken.  You have recurrent headaches or feel dizzy.  You have swelling in your ankles.  You have trouble with your vision. Get help right away if:  You develop a severe headache or confusion.  You have unusual weakness, numbness, or feel faint.  You have severe chest or abdominal pain.  You vomit repeatedly.  You have trouble breathing. This information is not intended to replace advice given to you by your health care provider. Make sure you discuss any questions you have with your health care provider. Document Released: 03/11/2005 Document Revised: 08/17/2015 Document Reviewed: 01/01/2013 Elsevier Interactive Patient Education  2017 [employer]

## 2016-05-09 NOTE — Progress Notes (Signed)
Pre visit review using our clinic review tool, if applicable. No additional management support is needed unless otherwise documented below in the visit note. 

## 2016-06-12 ENCOUNTER — Encounter: Payer: Self-pay | Admitting: Internal Medicine

## 2016-06-12 ENCOUNTER — Ambulatory Visit (INDEPENDENT_AMBULATORY_CARE_PROVIDER_SITE_OTHER): Payer: No Typology Code available for payment source | Admitting: Internal Medicine

## 2016-06-12 VITALS — BP 130/90 | HR 80 | Temp 98.8°F | Ht 62.0 in | Wt 137.8 lb

## 2016-06-12 DIAGNOSIS — I10 Essential (primary) hypertension: Secondary | ICD-10-CM | POA: Diagnosis not present

## 2016-06-12 MED ORDER — METOPROLOL TARTRATE 50 MG PO TABS: 50.0000 mg | ORAL_TABLET | Freq: Two times a day (BID) | ORAL | 3 refills | Status: DC

## 2016-06-12 NOTE — Patient Instructions (Signed)
Please take all new medication as prescribed - the metropolol 50 mg twice per day  Please continue all other medications as before, and refills have been done if requested.  Please have the pharmacy call with any other refills you may need.  Please keep your appointments with your specialists as you may have planned

## 2016-06-12 NOTE — Progress Notes (Signed)
   Subjective:    Patient ID: Becky Shaw, female    DOB: 27-Aug-1970, 46 y.o.   MRN: [id number]  HPI  Here to f/u BP -   Seen Feb 7 NP visit with abd pain that improved but BP remained elevated at f/u visit, tx with amlodipine 5 mg, pt checks BP at home daily, states top number improved but diast still borderline to mild elvated to 94 sometimes.   Has gained some wt recently due to work and less than ideal diet habits.  Wt Readings from Last 3 Encounters:  06/12/16 137 lb 12 oz (62.5 kg)  05/09/16 135 lb (61.2 kg)  05/01/16 133 lb (60.3 kg)  Brother pharmacist suggets to her for bystolic 10 mg since he and family did well, but she found out this cost $144 with her drug insurance.  Also c/o ? Menopause, normally monthly menses x 5 days, but most recently spotting for 12 days.  Plans to see GYN.   Past Medical History:  Diagnosis Date  . Abdominal pain, left lower quadrant 05/15/2007  . ALLERGIC RHINITIS 05/17/2007  . GANGLION CYST, WRIST, RIGHT 05/31/2008  . OAB (overactive bladder)   . OVERACTIVE BLADDER 05/15/2007   Past Surgical History:  Procedure Laterality Date  . CHOLECYSTECTOMY      reports that she has never smoked. She has never used smokeless tobacco. She reports that she drinks alcohol. She reports that she does not use drugs. family history includes Hypertension in her brother, father, and other; Stroke in her other; Stroke (age of onset: 7359) in her father. Allergies  Allergen Reactions  . Cetirizine Hcl     REACTION: insomnia   Current Outpatient Prescriptions on File Prior to Visit  Medication Sig Dispense Refill  . amLODipine (NORVASC) 5 MG tablet Take 1 tablet (5 mg total) by mouth daily. 30 tablet 3   No current facility-administered medications on file prior to visit.    Review of Systems All otherwise neg per pt    Objective:   Physical Exam BP 130/90 (BP Location: Left Arm, Patient Position: Sitting, Cuff Size: Normal)   Pulse 80   Temp 98.8 F (37.1 C)  (Oral)   Ht 5\' 2"  (1.575 m)   Wt 137 lb 12 oz (62.5 kg)   SpO2 99%   BMI 25.19 kg/m  VS noted,  Constitutional: Pt appears in no apparent distress HENT: Head: NCAT.  Right Ear: External ear normal.  Left Ear: External ear normal.  Eyes: . Pupils are equal, round, and reactive to light. Conjunctivae and EOM are normal Neck: Normal range of motion. Neck supple.  Cardiovascular: Normal rate and regular rhythm.   Pulmonary/Chest: Effort normal and breath sounds without rales or wheezing.  Neurological: Pt is alert. Not confused , motor grossly intact Skin: Skin is warm. No rash, no LE edema Psychiatric: Pt behavior is normal. No agitation.  No other exam findings    Assessment & Plan:

## 2016-06-12 NOTE — Progress Notes (Signed)
Pre visit review using our clinic review tool, if applicable. No additional management support is needed unless otherwise documented below in the visit note. 

## 2016-06-12 NOTE — Assessment & Plan Note (Addendum)
Mild uncontrolled, bystolic too expensive, for metoprolol 50 bid, d/c amlodipine as she thinks it causes cough b/c her pharmacist brother said so, and to cont to monitor BP at home, call 1 - 2 wks for persistent bp > 140/90

## 2016-12-31 ENCOUNTER — Ambulatory Visit (INDEPENDENT_AMBULATORY_CARE_PROVIDER_SITE_OTHER): Payer: 59 | Admitting: Internal Medicine

## 2016-12-31 ENCOUNTER — Encounter: Payer: Self-pay | Admitting: Internal Medicine

## 2016-12-31 DIAGNOSIS — I10 Essential (primary) hypertension: Secondary | ICD-10-CM

## 2016-12-31 MED ORDER — ASPIRIN EC 81 MG PO TBEC: 81.0000 mg | DELAYED_RELEASE_TABLET | Freq: Every day | ORAL | 11 refills | Status: DC

## 2016-12-31 MED ORDER — LISINOPRIL 10 MG PO TABS: 10.0000 mg | ORAL_TABLET | Freq: Every day | ORAL | 3 refills | Status: DC

## 2016-12-31 NOTE — Assessment & Plan Note (Signed)
Mild uncontrolled by hx, though today BP not elevated, for losartan 50 qd, cont metoprolol, check BP at home, start asa 81 qd,  to f/u any worsening symptoms or concerns

## 2016-12-31 NOTE — Patient Instructions (Signed)
Please take all new medication as prescribed  - the lisinopril 10 mg per day  Please continue to monitor your BP at home as you do, with the goal being to be less than 140/90 on average (and 120/80 would be nice)  Please start Aspirin 81 mg - 1 per day  Please continue all other medications as before, and refills have been done if requested.  Please have the pharmacy call with any other refills you may need.  Please keep your appointments with your specialists as you may have planned  Please return in 6 months, or sooner if needed

## 2016-12-31 NOTE — Progress Notes (Signed)
   Subjective:    Patient ID: Becky Shaw, female    DOB: 1970-10-27, 46 y.o.   MRN: [id number]  HPI  Here to f/u; overall doing ok,  Pt denies chest pain, increasing sob or doe, wheezing, orthopnea, PND, increased LE swelling, palpitations, dizziness or syncope.  Pt denies new neurological symptoms such as new headache, or facial or extremity weakness or numbness.  Pt denies polydipsia, polyuria, or low sugar episode.  Pt states overall good compliance with meds, mostly trying to follow appropriate diet, with wt overall stable,  Brother had TIA last wk, all siblings with HTN. Very concerned today as her BP at home is average 150/92 for last several wks.  Has no insurance today, does not want further labs or testing. Not taking the norvasc due to a cough she attributed it to Past Medical History:  Diagnosis Date  . Abdominal pain, left lower quadrant 05/15/2007  . ALLERGIC RHINITIS 05/17/2007  . GANGLION CYST, WRIST, RIGHT 05/31/2008  . OAB (overactive bladder)   . OVERACTIVE BLADDER 05/15/2007   Past Surgical History:  Procedure Laterality Date  . CHOLECYSTECTOMY      reports that she has never smoked. She has never used smokeless tobacco. She reports that she drinks alcohol. She reports that she does not use drugs. family history includes Hypertension in her brother, father, and other; Stroke in her other; Stroke (age of onset: 47) in her father. Allergies  Allergen Reactions  . Cetirizine Hcl     REACTION: insomnia   Current Outpatient Prescriptions on File Prior to Visit  Medication Sig Dispense Refill  . metoprolol (LOPRESSOR) 50 MG tablet Take 1 tablet (50 mg total) by mouth 2 (two) times daily. 180 tablet 3   No current facility-administered medications on file prior to visit.      Review of Systems All otherwise neg per pt     Objective:   Physical Exam BP 118/82   Pulse 77   Temp 98.2 F (36.8 C) (Oral)   Ht  (1.575 m)   Wt 132 lb (59.9 kg)   SpO2 98%   BMI 24.14  kg/m  VS noted,  Constitutional: Pt appears in NAD HENT: Head: NCAT.  Right Ear: External ear normal.  Left Ear: External ear normal.  Eyes: . Pupils are equal, round, and reactive to light. Conjunctivae and EOM are normal Nose: without d/c or deformity Neck: Neck supple. Gross normal ROM Cardiovascular: Normal rate and regular rhythm.   Pulmonary/Chest: Effort normal and breath sounds without rales or wheezing.  Abd:  Soft, NT, ND, + BS, no organomegaly Neurological: Pt is alert. At baseline orientation, motor grossly intact Skin: Skin is warm. No rashes, other new lesions, no LE edema Psychiatric: Pt behavior is normal without agitation   Lab Results  Component Value Date   WBC 6.6 06/19/2015   HGB 14.3 06/19/2015   HCT 41.5 06/19/2015   PLT 248.0 06/19/2015   GLUCOSE 108 (H) 06/19/2015   CHOL 188 06/19/2015   TRIG 149.0 06/19/2015   HDL 43.90 06/19/2015   LDLCALC 114 (H) 06/19/2015   ALT 19 06/19/2015   AST 15 06/19/2015   NA 138 06/19/2015   K 3.9 06/19/2015   CL 105 06/19/2015   CREATININE 0.69 06/19/2015   BUN 16 06/19/2015   CO2 26 06/19/2015   TSH 1.41 06/19/2015        Assessment & Plan:

## 2017-05-06 ENCOUNTER — Telehealth: Payer: Self-pay | Admitting: Internal Medicine

## 2017-05-06 MED ORDER — SCOPOLAMINE 1 MG/3DAYS TD PT72: 1.0000 | MEDICATED_PATCH | TRANSDERMAL | 0 refills | Status: DC

## 2017-05-06 NOTE — Telephone Encounter (Signed)
Copied from CRM [id number]. Topic: Quick Communication - See Telephone Encounter >> May 06, 2017 11:34 AM Eston Mouldavis, Spyridon Hornstein B wrote: CRM for notification. See Telephone encounter for:  Pt is requesting a motion sickness patch as she is going on a cruise in March.  [phone] [address], [place] - [address] [facility] [address] [phone] (Phone) [phone] (Fax)   05/06/17.

## 2017-05-06 NOTE — Addendum Note (Signed)
Addended by: Corwin LevinsJOHN, Ivanell Deshotel W on: 05/06/2017 01:09 PM   Modules accepted: Orders

## 2017-05-06 NOTE — Telephone Encounter (Signed)
Done erx 

## 2019-11-09 ENCOUNTER — Telehealth: Payer: Self-pay | Admitting: Internal Medicine

## 2019-11-09 NOTE — Telephone Encounter (Signed)
PT would like to transfer care from Dr Jonny Ruiz to Dr Loyal Buba to schedule

## 2019-11-09 NOTE — Telephone Encounter (Signed)
Ok with me 

## 2019-11-10 NOTE — Telephone Encounter (Signed)
Attempted to call patient and schedule for TOC. Ok per Dr.Cody and Dr.John. LVM to call back.

## 2019-11-25 ENCOUNTER — Ambulatory Visit (INDEPENDENT_AMBULATORY_CARE_PROVIDER_SITE_OTHER): Payer: 59 | Admitting: Family Medicine

## 2019-11-25 ENCOUNTER — Encounter: Payer: Self-pay | Admitting: Family Medicine

## 2019-11-25 ENCOUNTER — Other Ambulatory Visit: Payer: Self-pay

## 2019-11-25 VITALS — BP 150/100 | HR 72 | Temp 97.7°F | Ht 62.0 in | Wt 136.0 lb

## 2019-11-25 DIAGNOSIS — Z131 Encounter for screening for diabetes mellitus: Secondary | ICD-10-CM | POA: Diagnosis not present

## 2019-11-25 DIAGNOSIS — R7303 Prediabetes: Secondary | ICD-10-CM | POA: Insufficient documentation

## 2019-11-25 DIAGNOSIS — I10 Essential (primary) hypertension: Secondary | ICD-10-CM

## 2019-11-25 LAB — COMPREHENSIVE METABOLIC PANEL
ALT: 43 U/L — ABNORMAL HIGH (ref 0–35)
AST: 25 U/L (ref 0–37)
Albumin: 4.5 g/dL (ref 3.5–5.2)
Alkaline Phosphatase: 59 U/L (ref 39–117)
BUN: 13 mg/dL (ref 6–23)
CO2: 30 mEq/L (ref 19–32)
Calcium: 9.7 mg/dL (ref 8.4–10.5)
Chloride: 103 mEq/L (ref 96–112)
Creatinine, Ser: 0.67 mg/dL (ref 0.40–1.20)
GFR: 93.74 mL/min (ref >60.00)
Glucose, Bld: 91 mg/dL (ref 70–99)
Potassium: 3.7 mEq/L (ref 3.5–5.1)
Sodium: 139 mEq/L (ref 135–145)
Total Bilirubin: 1.1 mg/dL (ref 0.2–1.2)
Total Protein: 7.7 g/dL (ref 6.0–8.3)

## 2019-11-25 LAB — LIPID PANEL
Cholesterol: 180 mg/dL (ref 0–200)
HDL: 40.7 mg/dL (ref >39.00)
LDL Cholesterol: 109 mg/dL — ABNORMAL HIGH (ref 0–99)
NonHDL: 139.65
Total CHOL/HDL Ratio: 4
Triglycerides: 155 mg/dL — ABNORMAL HIGH (ref 0.0–149.0)
VLDL: 31 mg/dL (ref 0.0–40.0)

## 2019-11-25 LAB — HEMOGLOBIN A1C: Hgb A1c MFr Bld: 6.1 % (ref 4.6–6.5)

## 2019-11-25 MED ORDER — AMLODIPINE BESYLATE 5 MG PO TABS
5.0000 mg | ORAL_TABLET | Freq: Every day | ORAL | 3 refills | Status: DC
Start: 2019-11-25 — End: 2019-12-27

## 2019-11-25 NOTE — Progress Notes (Signed)
   Subjective:     Becky Shaw is a 49 y.o. female presenting for Hypertension     HPI   #HTN - bp is high - checks it at home - stopped taking medication - numbers at home - will get higher numbers on the left side compared to the right - 140/95, occasional 140s/90s - does not recall the medications helping -    Review of Systems   Social History   Tobacco Use  Smoking Status Never Smoker  Smokeless Tobacco Never Used        Objective:    BP Readings from Last 3 Encounters:  11/25/19 (!) 150/100  12/31/16 118/82  06/12/16 130/90   Wt Readings from Last 3 Encounters:  11/25/19 136 lb (61.7 kg)  12/31/16 132 lb (59.9 kg)  06/12/16 137 lb 12 oz (62.5 kg)    BP (!) 150/100   Pulse 72   Temp 97.7 F (36.5 C) (Temporal)   Ht 5\' 2"  (1.575 m)   Wt 136 lb (61.7 kg)   LMP 06/29/2019 (Exact Date)   SpO2 98%   BMI 24.87 kg/m    Physical Exam Constitutional:      General: She is not in acute distress.    Appearance: She is well-developed. She is not diaphoretic.  HENT:     Right Ear: External ear normal.     Left Ear: External ear normal.     Nose: Nose normal.  Eyes:     Conjunctiva/sclera: Conjunctivae normal.  Cardiovascular:     Rate and Rhythm: Normal rate and regular rhythm.     Heart sounds: No murmur heard.   Pulmonary:     Effort: Pulmonary effort is normal. No respiratory distress.     Breath sounds: Normal breath sounds. No wheezing.  Musculoskeletal:     Cervical back: Neck supple.  Skin:    General: Skin is warm and dry.     Capillary Refill: Capillary refill takes less than 2 seconds.  Neurological:     Mental Status: She is alert. Mental status is at baseline.  Psychiatric:        Mood and Affect: Mood normal.        Behavior: Behavior normal.           Assessment & Plan:   Problem List Items Addressed This Visit      Cardiovascular and Mediastinum   HTN (hypertension) - Primary    Has not been on medication for  several years, and reports previous medications did not work. Several family members with HTN and stroke - discussed concern for increased risk and advised starting medication. Amlodipine 5 mg with plan to increase if bp remains high. Return in 4 weeks      Relevant Medications   amLODipine (NORVASC) 5 MG tablet   Other Relevant Orders   Comprehensive metabolic panel   Lipid panel    Other Visit Diagnoses    Screening for diabetes mellitus       Relevant Orders   Hemoglobin A1c       Return in about 4 weeks (around 12/23/2019).  12/25/2019, MD  This visit occurred during the SARS-CoV-2 public health emergency.  Safety protocols were in place, including screening questions prior to the visit, additional usage of staff PPE, and extensive cleaning of exam room while observing appropriate contact time as indicated for disinfecting solutions.

## 2019-11-25 NOTE — Assessment & Plan Note (Addendum)
Uncontrolled. Has not been on medication for several years, and reports previous medications did not work. Several family members with HTN and stroke - discussed concern for increased risk and advised starting medication. Amlodipine 5 mg with plan to increase if bp remains high. Return in 4 weeks

## 2019-11-25 NOTE — Patient Instructions (Signed)
Blood pressure - start amlodipine 5 mg daily - if after 2 weeks still getting high blood pressure, start taking 10 mg (2 tablets daily)    Your blood pressure high.   High blood pressure increases your risk for heart attack and stroke.   Please check your blood pressure 2-4 times a week.   To check your blood pressure 1) Sit in a quiet and relaxed place for 5 minutes 2) Make sure your feet are flat on the ground 3) Consider checking first thing in the morning   Normal blood pressure is less than 140/90 Ideally you blood pressure should be around 120/80  Other ways you can reduce your blood pressure:  1) Regular exercise -- Try to get 150 minutes (30 minutes, 5 days a week) of moderate to vigorous aerobic excercise -- Examples: brisk walking (2.5 miles per hour), water aerobics, dancing, gardening, tennis, biking slower than 10 miles per hour 2) DASH Diet - low fat meats, more fresh fruits and vegetables, whole grains, low salt 3) Quit smoking if you smoke 4) Loose 5-10% of your body weight

## 2019-12-27 ENCOUNTER — Encounter: Payer: Self-pay | Admitting: Family Medicine

## 2019-12-27 ENCOUNTER — Other Ambulatory Visit: Payer: Self-pay

## 2019-12-27 ENCOUNTER — Ambulatory Visit (INDEPENDENT_AMBULATORY_CARE_PROVIDER_SITE_OTHER): Payer: 59 | Admitting: Family Medicine

## 2019-12-27 VITALS — BP 121/100 | HR 85 | Temp 98.0°F | Ht 62.0 in | Wt 133.5 lb

## 2019-12-27 DIAGNOSIS — I10 Essential (primary) hypertension: Secondary | ICD-10-CM | POA: Diagnosis not present

## 2019-12-27 DIAGNOSIS — L659 Nonscarring hair loss, unspecified: Secondary | ICD-10-CM | POA: Diagnosis not present

## 2019-12-27 LAB — IBC PANEL
Iron: 140 ug/dL (ref 42–145)
Saturation Ratios: 36.5 % (ref 20.0–50.0)
Transferrin: 274 mg/dL (ref 212.0–360.0)

## 2019-12-27 LAB — TSH: TSH: 1.23 u[IU]/mL (ref 0.35–4.50)

## 2019-12-27 LAB — CBC
HCT: 44.1 % (ref 36.0–46.0)
Hemoglobin: 14.8 g/dL (ref 12.0–15.0)
MCHC: 33.6 g/dL (ref 30.0–36.0)
MCV: 90.3 fl (ref 78.0–100.0)
Platelets: 210 10*3/uL (ref 150.0–400.0)
RBC: 4.88 Mil/uL (ref 3.87–5.11)
RDW: 12.4 % (ref 11.5–15.5)
WBC: 5.7 10*3/uL (ref 4.0–10.5)

## 2019-12-27 LAB — FERRITIN: Ferritin: 121.4 ng/mL (ref 10.0–291.0)

## 2019-12-27 MED ORDER — AMLODIPINE BESYLATE 5 MG PO TABS: 5.0000 mg | ORAL_TABLET | Freq: Every day | ORAL | 3 refills | Status: DC

## 2019-12-27 NOTE — Progress Notes (Signed)
   Subjective:     Becky Shaw is a 49 y.o. female presenting for Follow-up (4 week- hypertension )     HPI  #HTN - Has been checking at home: 110-120/70-83 - no cp, sob, ha - did have some chest tightness initially but this improved - hair falling out more  Hairloss - more in the shower - no patches - family with hx of thinning hair  #covid exposure   Review of Systems   Social History   Tobacco Use  Smoking Status Never Smoker  Smokeless Tobacco Never Used        Objective:    BP Readings from Last 3 Encounters:  12/27/19 (!) 121/100  11/25/19 (!) 150/100  12/31/16 118/82   Wt Readings from Last 3 Encounters:  12/27/19 133 lb 8 oz (60.6 kg)  11/25/19 136 lb (61.7 kg)  12/31/16 132 lb (59.9 kg)    BP (!) 121/100   Pulse 85   Temp 98 F (36.7 C) (Temporal)   Ht 5\' 2"  (1.575 m)   Wt 133 lb 8 oz (60.6 kg)   LMP 06/27/2019   SpO2 98%   BMI 24.42 kg/m    Physical Exam Constitutional:      General: She is not in acute distress.    Appearance: She is well-developed. She is not diaphoretic.  HENT:     Right Ear: External ear normal.     Left Ear: External ear normal.  Eyes:     Conjunctiva/sclera: Conjunctivae normal.  Cardiovascular:     Rate and Rhythm: Normal rate and regular rhythm.     Heart sounds: No murmur heard.   Pulmonary:     Effort: Pulmonary effort is normal. No respiratory distress.     Breath sounds: Normal breath sounds. No wheezing.  Musculoskeletal:     Cervical back: Neck supple.  Skin:    General: Skin is warm and dry.     Capillary Refill: Capillary refill takes less than 2 seconds.  Neurological:     Mental Status: She is alert. Mental status is at baseline.  Psychiatric:        Mood and Affect: Mood normal.        Behavior: Behavior normal.           Assessment & Plan:   Problem List Items Addressed This Visit      Cardiovascular and Mediastinum   Essential hypertension    Home BP log with excellent  control. Continue amlodipine - suspect some white coat hypertension.       Relevant Medications   amLODipine (NORVASC) 5 MG tablet     Other   Hair loss - Primary    Notes thinning of hair. Will get blood work - though may be genetic with several family members with thin hair.       Relevant Orders   TSH   CBC   Ferritin   Iron and TIBC       Return in about 6 months (around 06/26/2020) for annual wellness.  08/26/2020, MD  This visit occurred during the SARS-CoV-2 public health emergency.  Safety protocols were in place, including screening questions prior to the visit, additional usage of staff PPE, and extensive cleaning of exam room while observing appropriate contact time as indicated for disinfecting solutions.

## 2019-12-27 NOTE — Assessment & Plan Note (Signed)
Notes thinning of hair. Will get blood work - though may be genetic with several family members with thin hair.

## 2019-12-27 NOTE — Assessment & Plan Note (Signed)
Home BP log with excellent control. Continue amlodipine - suspect some white coat hypertension.

## 2019-12-27 NOTE — Addendum Note (Signed)
Addended by: Alvina Chou on: 12/27/2019 12:13 PM   Modules accepted: Orders

## 2020-01-18 ENCOUNTER — Telehealth: Payer: Self-pay | Admitting: *Deleted

## 2020-01-18 NOTE — Telephone Encounter (Signed)
Patient called back stating that she started noticing the problem with her vision Sunday night. Patient stated that the vision in her left eye that night was fine and the problem was with her right eye. Patient stated when she looked at the television people's faces were wavy and that has continue with her right eye. Patient stated today the problem seems to be with her left eye also. Patient stated that her blood pressure has been normal and she has not had any type of pain. 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 After speaking to Dr. Selena Batten patient was advised that she should see an optometrist for an eye exam. Patient stated that she will reach out to an optometrist and get an appointment scheduled as soon as possible. Advised patient if she has a problem getting an appointment scheduled to let Dr.Cody know. Patient was given ER/Urgent Care precautions and she verbalized understanding.

## 2020-01-18 NOTE — Telephone Encounter (Signed)
Agree with plan 

## 2020-01-18 NOTE — Telephone Encounter (Signed)
Patient left a voicemail stating that she has noticed since Sunday there is a problem with her vision. Patient stated that when she looks at people they look wavy. Tried to call patient back to get more information and got her voicemail. Left a message for patient to call back.

## 2020-01-19 ENCOUNTER — Other Ambulatory Visit: Payer: Self-pay

## 2020-01-19 DIAGNOSIS — Z5321 Procedure and treatment not carried out due to patient leaving prior to being seen by health care provider: Secondary | ICD-10-CM | POA: Insufficient documentation

## 2020-01-19 DIAGNOSIS — H53453 Other localized visual field defect, bilateral: Secondary | ICD-10-CM | POA: Insufficient documentation

## 2020-01-19 DIAGNOSIS — R42 Dizziness and giddiness: Secondary | ICD-10-CM | POA: Diagnosis not present

## 2020-01-19 NOTE — ED Notes (Signed)
Case discussed with Dr. Katrinka Blazing, no protocols to be done at this time.

## 2020-01-19 NOTE — ED Triage Notes (Signed)
Pt states since Sunday she has had some vision changes. States when looking "from my right eye I see further and left eye I see closer". States today started having some dizziness, no history of the same. No other deficits or weakness, no history of the same.

## 2020-01-20 ENCOUNTER — Emergency Department
Admission: EM | Admit: 2020-01-20 | Discharge: 2020-01-20 | Disposition: A | Discharge status: Home / Self Care | Payer: 59 | Attending: Emergency Medicine | Admitting: Emergency Medicine

## 2020-01-20 NOTE — ED Notes (Signed)
No answer when called several times from lobby 

## 2020-03-08 ENCOUNTER — Telehealth: Payer: Self-pay

## 2020-03-08 DIAGNOSIS — I10 Essential (primary) hypertension: Secondary | ICD-10-CM

## 2020-03-08 MED ORDER — AMLODIPINE BESYLATE 5 MG PO TABS: 5.0000 mg | ORAL_TABLET | Freq: Every day | ORAL | 1 refills | Status: DC

## 2020-03-08 NOTE — Telephone Encounter (Signed)
Pt called requesting mail order pharmacy to be added to her chart and her insurance is requiring them to use mail order... Rx forwarded to mail order per pt's request and has been added to chart

## 2020-06-26 ENCOUNTER — Ambulatory Visit (INDEPENDENT_AMBULATORY_CARE_PROVIDER_SITE_OTHER): Payer: 59 | Admitting: Family Medicine

## 2020-06-26 ENCOUNTER — Other Ambulatory Visit (HOSPITAL_COMMUNITY)
Admission: RE | Admit: 2020-06-26 | Discharge: 2020-06-26 | Disposition: A | Discharge status: Home / Self Care | Payer: 59 | Source: Ambulatory Visit | Attending: Family Medicine | Admitting: Family Medicine

## 2020-06-26 ENCOUNTER — Encounter: Payer: Self-pay | Admitting: Family Medicine

## 2020-06-26 ENCOUNTER — Other Ambulatory Visit: Payer: Self-pay

## 2020-06-26 VITALS — BP 124/72 | HR 82 | Temp 97.7°F | Ht 62.0 in | Wt 137.0 lb

## 2020-06-26 DIAGNOSIS — Z0000 Encounter for general adult medical examination without abnormal findings: Secondary | ICD-10-CM

## 2020-06-26 DIAGNOSIS — Z124 Encounter for screening for malignant neoplasm of cervix: Secondary | ICD-10-CM

## 2020-06-26 DIAGNOSIS — E782 Mixed hyperlipidemia: Secondary | ICD-10-CM | POA: Diagnosis not present

## 2020-06-26 DIAGNOSIS — T753XXA Motion sickness, initial encounter: Secondary | ICD-10-CM

## 2020-06-26 DIAGNOSIS — I10 Essential (primary) hypertension: Secondary | ICD-10-CM

## 2020-06-26 DIAGNOSIS — E785 Hyperlipidemia, unspecified: Secondary | ICD-10-CM | POA: Insufficient documentation

## 2020-06-26 DIAGNOSIS — R7303 Prediabetes: Secondary | ICD-10-CM

## 2020-06-26 DIAGNOSIS — E1169 Type 2 diabetes mellitus with other specified complication: Secondary | ICD-10-CM | POA: Insufficient documentation

## 2020-06-26 DIAGNOSIS — Z1211 Encounter for screening for malignant neoplasm of colon: Secondary | ICD-10-CM

## 2020-06-26 LAB — COMPREHENSIVE METABOLIC PANEL
ALT: 24 U/L (ref 0–35)
AST: 18 U/L (ref 0–37)
Albumin: 4.5 g/dL (ref 3.5–5.2)
Alkaline Phosphatase: 85 U/L (ref 39–117)
BUN: 13 mg/dL (ref 6–23)
CO2: 30 mEq/L (ref 19–32)
Calcium: 9.6 mg/dL (ref 8.4–10.5)
Chloride: 104 mEq/L (ref 96–112)
Creatinine, Ser: 0.69 mg/dL (ref 0.40–1.20)
GFR: 102.13 mL/min (ref >60.00)
Glucose, Bld: 108 mg/dL — ABNORMAL HIGH (ref 70–99)
Potassium: 3.6 mEq/L (ref 3.5–5.1)
Sodium: 140 mEq/L (ref 135–145)
Total Bilirubin: 0.7 mg/dL (ref 0.2–1.2)
Total Protein: 7.7 g/dL (ref 6.0–8.3)

## 2020-06-26 LAB — LIPID PANEL
Cholesterol: 165 mg/dL (ref 0–200)
HDL: 43.4 mg/dL (ref >39.00)
LDL Cholesterol: 88 mg/dL (ref 0–99)
NonHDL: 121.59
Total CHOL/HDL Ratio: 4
Triglycerides: 166 mg/dL — ABNORMAL HIGH (ref 0.0–149.0)
VLDL: 33.2 mg/dL (ref 0.0–40.0)

## 2020-06-26 LAB — CBC
HCT: 43.8 % (ref 36.0–46.0)
Hemoglobin: 14.8 g/dL (ref 12.0–15.0)
MCHC: 33.7 g/dL (ref 30.0–36.0)
MCV: 88.9 fl (ref 78.0–100.0)
Platelets: 210 10*3/uL (ref 150.0–400.0)
RBC: 4.92 Mil/uL (ref 3.87–5.11)
RDW: 12.5 % (ref 11.5–15.5)
WBC: 5.7 10*3/uL (ref 4.0–10.5)

## 2020-06-26 LAB — HEMOGLOBIN A1C: Hgb A1c MFr Bld: 6.2 % (ref 4.6–6.5)

## 2020-06-26 MED ORDER — SCOPOLAMINE 1 MG/3DAYS TD PT72: 1.0000 | MEDICATED_PATCH | TRANSDERMAL | 0 refills | Status: DC

## 2020-06-26 MED ORDER — AMLODIPINE BESYLATE 5 MG PO TABS: 5.0000 mg | ORAL_TABLET | Freq: Every day | ORAL | 3 refills | Status: DC

## 2020-06-26 NOTE — Patient Instructions (Signed)
Preventive Care 49-49 Years Old, Female Preventive care refers to lifestyle choices and visits with your health care provider that can promote health and wellness. This includes:  A yearly physical exam. This is also called an annual wellness visit.  Regular dental and eye exams.  Immunizations.  Screening for certain conditions.  Healthy lifestyle choices, such as: ? Eating a healthy diet. ? Getting regular exercise. ? Not using drugs or products that contain nicotine and tobacco. ? Limiting alcohol use. What can I expect for my preventive care visit? Physical exam Your health care provider will check your:  Height and weight. These may be used to calculate your BMI (body mass index). BMI is a measurement that tells if you are at a healthy weight.  Heart rate and blood pressure.  Body temperature.  Skin for abnormal spots. Counseling Your health care provider may ask you questions about your:  Past medical problems.  Family's medical history.  Alcohol, tobacco, and drug use.  Emotional well-being.  Home life and relationship well-being.  Sexual activity.  Diet, exercise, and sleep habits.  Work and work Statistician.  Access to firearms.  Method of birth control.  Menstrual cycle.  Pregnancy history. What immunizations do I need? Vaccines are usually given at various ages, according to a schedule. Your health care provider will recommend vaccines for you based on your age, medical history, and lifestyle or other factors, such as travel or where you work.   What tests do I need? Blood tests  Lipid and cholesterol levels. These may be checked every 5 years, or more often if you are over 49 years old.  Hepatitis C test.  Hepatitis B test. Screening  Lung cancer screening. You may have this screening every year starting at age 49 if you have a 30-pack-year history of smoking and currently smoke or have quit within the past 15 years.  Colorectal cancer  screening. ? All adults should have this screening starting at age 49 and continuing until age 17. ? Your health care provider may recommend screening at age 49 if you are at increased risk. ? You will have tests every 1-10 years, depending on your results and the type of screening test.  Diabetes screening. ? This is done by checking your blood sugar (glucose) after you have not eaten for a while (fasting). ? You may have this done every 1-3 years.  Mammogram. ? This may be done every 1-2 years. ? Talk with your health care provider about when you should start having regular mammograms. This may depend on whether you have a family history of breast cancer.  BRCA-related cancer screening. This may be done if you have a family history of breast, ovarian, tubal, or peritoneal cancers.  Pelvic exam and Pap test. ? This may be done every 3 years starting at age 10. ? Starting at age 11, this may be done every 5 years if you have a Pap test in combination with an HPV test. Other tests  STD (sexually transmitted disease) testing, if you are at risk.  Bone density scan. This is done to screen for osteoporosis. You may have this scan if you are at high risk for osteoporosis. Talk with your health care provider about your test results, treatment options, and if necessary, the need for more tests. Follow these instructions at home: Eating and drinking  Eat a diet that includes fresh fruits and vegetables, whole grains, lean protein, and low-fat dairy products.  Take vitamin and mineral supplements  as recommended by your health care provider.  Do not drink alcohol if: ? Your health care provider tells you not to drink. ? You are pregnant, may be pregnant, or are planning to become pregnant.  If you drink alcohol: ? Limit how much you have to 0-1 drink a day. ? Be aware of how much alcohol is in your drink. In the [place] one drink equals one 12 oz bottle of beer (355 mL), one 5 oz glass of  wine (148 mL), or one 1 oz glass of hard liquor (44 mL).   Lifestyle  Take daily care of your teeth and gums. Brush your teeth every morning and night with fluoride toothpaste. Floss one time each day.  Stay active. Exercise for at least 30 minutes 5 or more days each week.  Do not use any products that contain nicotine or tobacco, such as cigarettes, e-cigarettes, and chewing tobacco. If you need help quitting, ask your health care provider.  Do not use drugs.  If you are sexually active, practice safe sex. Use a condom or other form of protection to prevent STIs (sexually transmitted infections).  If you do not wish to become pregnant, use a form of birth control. If you plan to become pregnant, see your health care provider for a prepregnancy visit.  If told by your health care provider, take low-dose aspirin daily starting at age 50.  Find healthy ways to cope with stress, such as: ? Meditation, yoga, or listening to music. ? Journaling. ? Talking to a trusted person. ? Spending time with friends and family. Safety  Always wear your seat belt while driving or riding in a vehicle.  Do not drive: ? If you have been drinking alcohol. Do not ride with someone who has been drinking. ? When you are tired or distracted. ? While texting.  Wear a helmet and other protective equipment during sports activities.  If you have firearms in your house, make sure you follow all gun safety procedures. What's next?  Visit your health care provider once a year for an annual wellness visit.  Ask your health care provider how often you should have your eyes and teeth checked.  Stay up to date on all vaccines. This information is not intended to replace advice given to you by your health care provider. Make sure you discuss any questions you have with your health care provider. Document Revised: 12/14/2019 Document Reviewed: 11/20/2017 Elsevier Patient Education  2021 [employer]  

## 2020-06-26 NOTE — Progress Notes (Signed)
Annual Exam   Chief Complaint:  Chief Complaint  Patient presents with  . Annual Exam    History of Present Illness:  [name] is a 50 y.o. No obstetric history on file. who LMP was No LMP recorded. Patient is perimenopausal., presents today for her annual examination.     Nutrition Diet: good appetite, eating well Exercise: morning daily - core strengthening exercising and occasionally in the evening She does not get adequate calcium and Vitamin D in her diet.   Social History   Tobacco Use  Smoking Status Never Smoker  Smokeless Tobacco Never Used   Social History   Substance and Sexual Activity  Alcohol Use Not Currently   Social History   Substance and Sexual Activity  Drug Use No    Safety The patient wears seatbelts: yes.     The patient feels safe at home and in their relationships: yes.  General Health Dentist in the last year: Yes Eye doctor: yes  Menstrual April 2021, December 2021, and Jan 2022, Mar 2022  GYN She is single partner, contraception - none.    Cervical Cancer Screening:   Last Pap:   April 2008 Results were: no abnormalities HPV DNA not done   Breast Cancer Screening There is no FH of breast cancer. There is no FH of ovarian cancer. BRCA screening Not Indicated.  Discussed that for average risk women between age 48-49 screening may reduce the risk of breast cancer death, however, at a lower rate than those over age 33. And that the the false-positive rates resulting in unnecessary biopsies with more screening is higher. The balance of benefits vs harms likely improves as you progress through your 40s. The patient does not want a mammogram this year.   Colon Cancer Screening:  Age 5-75 yo - benefits outweigh the risk. Adults 52-85 yo who have never been screened benefit.  Benefits: 134000 people in 2016 will be diagnosed and 49,000 will die - early detection helps Harms: Complications 2/2 to colonoscopy High Risk  (Colonoscopy): genetic disorder (Lynch syndrome or familial adenomatous polyposis), personal hx of IBD, previous adenomatous polyp, or previous colorectal cancer, FamHx start 10 years before the age at diagnosis, increased in males and black race  Options:  FIT - looks for hemoglobin (blood in the stool) - specific and fairly sensitive - must be done annually Cologuard - looks for DNA and blood - more sensitive - therefore can have more false positives, every 3 years Colonoscopy - every 10 years if normal - sedation, bowl prep, must have someone drive you  Shared decision making and the patient had decided to do FOBT.  Weight Wt Readings from Last 3 Encounters:  06/26/20 137 lb (62.1 kg)  01/19/20 135 lb (61.2 kg)  12/27/19 133 lb 8 oz (60.6 kg)   Patient has normal BMI  BMI Readings from Last 1 Encounters:  06/26/20 25.06 kg/m     Chronic disease screening Blood pressure monitoring:  BP Readings from Last 3 Encounters:  06/26/20 124/72  01/19/20 127/72  12/27/19 (!) 121/100    Lipid Monitoring: Indication for screening: age >75, obesity, diabetes, family hx, CV risk factors.  Lipid screening: Yes  Lab Results  Component Value Date   CHOL 180 11/25/2019   HDL 40.70 11/25/2019   LDLCALC 109 (H) 11/25/2019   TRIG 155.0 (H) 11/25/2019   CHOLHDL 4 11/25/2019     Diabetes Screening: age >75, overweight, family hx, PCOS, hx of gestational diabetes, at risk ethnicity  Diabetes Screening screening: Yes  Lab Results  Component Value Date   HGBA1C 6.1 11/25/2019     Past Medical History:  Diagnosis Date  . Abdominal pain, left lower quadrant 05/15/2007  . ALLERGIC RHINITIS 05/17/2007  . GANGLION CYST, WRIST, RIGHT 05/31/2008  . OAB (overactive bladder)   . OVERACTIVE BLADDER 05/15/2007    Past Surgical History:  Procedure Laterality Date  . CHOLECYSTECTOMY      Prior to Admission medications   Medication Sig Start Date End Date Taking? Authorizing Provider   amLODipine (NORVASC) 5 MG tablet Take 1 tablet (5 mg total) by mouth daily. 03/08/20  Yes Lesleigh Noe, MD    Allergies  Allergen Reactions  . Cetirizine Hcl     REACTION: insomnia    Gynecologic History: No LMP recorded. Patient is perimenopausal.  Obstetric History: No obstetric history on file.  Social History   Socioeconomic History  . Marital status: Married    Spouse name: Thao  . Number of children: 1  . Years of education: associates  . Highest education level: Not on file  Occupational History  . Not on file  Tobacco Use  . Smoking status: Never Smoker  . Smokeless tobacco: Never Used  Substance and Sexual Activity  . Alcohol use: Not Currently  . Drug use: No  . Sexual activity: Yes    Comment: perimenopausal  Other Topics Concern  . 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 Stroke Father 27  . Hypertension Father   . Hypertension Brother        4 brothers with HTN  . Stroke Brother 48  . Transient ischemic attack Brother 55    Review of Systems  Constitutional: Negative for chills and fever.  HENT: Negative for congestion and sore throat.   Eyes: Negative for blurred vision and  double vision.  Respiratory: Negative for shortness of breath.   Cardiovascular: Negative for chest pain.  Gastrointestinal: Negative for heartburn, nausea and vomiting.  Genitourinary: Negative.   Musculoskeletal: Negative.  Negative for myalgias.  Skin: Negative for rash.  Neurological: Negative for dizziness and headaches.  Endo/Heme/Allergies: Does not bruise/bleed easily.  Psychiatric/Behavioral: Negative for depression. The patient is not nervous/anxious.      Physical Exam BP 124/72   Pulse 82   Temp 97.7 F (36.5 C) (Temporal)   Ht '5\' 2"'  (1.575 m)   Wt 137 lb (62.1 kg)   SpO2 99%   BMI 25.06 kg/m    BP Readings from Last 3 Encounters:  06/26/20 124/72  01/19/20 127/72  12/27/19 (!) 121/100      Physical Exam Exam conducted with a chaperone present.  Constitutional:      General: She is not in acute distress.    Appearance: She is well-developed. She is not diaphoretic.  HENT:     Head: Normocephalic and atraumatic.     Right Ear: External ear normal.     Left Ear: External ear normal.     Nose: Nose normal.  Eyes:     General: No scleral icterus.    Conjunctiva/sclera: Conjunctivae normal.  Cardiovascular:     Rate and Rhythm: Normal rate and regular rhythm.     Heart sounds: No murmur heard.   Pulmonary:     Effort: Pulmonary effort is normal. No respiratory distress.     Breath sounds: Normal breath sounds. No wheezing.  Abdominal:     General: Bowel sounds are normal. There is no distension.     Palpations: Abdomen is soft. There is no mass.     Tenderness: There is no abdominal tenderness. There is no guarding or rebound.  Genitourinary:    Cervix: Erythema present. No friability.  Musculoskeletal:        General: Normal range of motion.     Cervical back: Neck supple.  Lymphadenopathy:     Cervical: No cervical adenopathy.  Skin:    General: Skin is warm and dry.     Capillary Refill: Capillary refill takes less than 2 seconds.   Neurological:     Mental Status: She is alert and oriented to person, place, and time.     Deep Tendon Reflexes: Reflexes normal.  Psychiatric:        Behavior: Behavior normal.      Results:  PHQ-9:  [place] Office Visit from 06/26/2020 in [place] at [place]  PHQ-9 Total Score 1        Assessment: 50 y.o. No obstetric history on file. female here for routine annual physical examination.  Plan: Problem List Items Addressed This Visit      Cardiovascular and Mediastinum   Essential hypertension   Relevant Medications   amLODipine (NORVASC) 5 MG tablet   Other Relevant Orders   Comprehensive metabolic panel   CBC     Other   Prediabetes - Primary   Relevant Orders   Hemoglobin A1c   Hyperlipidemia   Relevant Medications   amLODipine (NORVASC) 5 MG tablet   Other Relevant Orders   Lipid panel    Other Visit Diagnoses    Motion sickness, initial encounter       Relevant Medications   scopolamine (TRANSDERM-SCOP, 1.5 MG,) 1 MG/3DAYS   Annual physical exam       Relevant Orders   Lipid panel   Hemoglobin A1c   Comprehensive metabolic panel   CBC   Screening for colon cancer       Relevant Orders   Fecal occult blood, imunochemical   Screening for cervical cancer       Relevant Orders   Cytology - PAP([place])      Screening: -- Blood pressure screen normal -- cholesterol screening: will obtain -- Weight screening: normal -- Diabetes Screening: will obtain -- Nutrition: Encouraged healthy diet  The 10-year ASCVD risk score Mikey Bussing DC Jr., et al., 2013) is: 1.8%   Values used to calculate the score:     Age: 52 years     Sex: Female     Is Non-Hispanic African American:  No     Diabetic: No     Tobacco smoker: No     Systolic Blood Pressure: 611 mmHg     Is BP treated: Yes     HDL Cholesterol: 40.7 mg/dL     Total Cholesterol: 180 mg/dL  -- Statin therapy for Age 2-75 with CVD risk >7.5%  Psych -- Depression screening  (PHQ-9):  [place] Visit from 06/26/2020 in [place] at [facility]  PHQ-9 Total Score 1       Safety -- tobacco screening: not using -- alcohol screening:  low-risk usage. -- no evidence of domestic violence or intimate partner violence.   Cancer Screening -- pap smear collected per ASCCP guidelines -- family history of breast cancer screening: done. not at high risk. -- Mammogram - will wait until next year -- Colon cancer (age 63+)-- FOBT  Immunizations Immunization History  Administered Date(s) Administered  . Influenza Split 12/13/2010, 12/16/2011  . Moderna Sars-Covid-2 Vaccination 03/23/2020  . Td 09/27/2009    -- flu vaccine up to date -- TDAP q10 years unknown, record requested -- Covid-19 Vaccine up to date   Encouraged healthy diet and exercise. Encouraged regular vision and dental care.   Lesleigh Noe, MD

## 2020-06-27 LAB — CYTOLOGY - PAP
Comment: NEGATIVE
Diagnosis: NEGATIVE
Diagnosis: REACTIVE
High risk HPV: NEGATIVE

## 2020-06-30 ENCOUNTER — Other Ambulatory Visit (INDEPENDENT_AMBULATORY_CARE_PROVIDER_SITE_OTHER): Payer: 59

## 2020-06-30 DIAGNOSIS — Z1211 Encounter for screening for malignant neoplasm of colon: Secondary | ICD-10-CM

## 2020-06-30 LAB — FECAL OCCULT BLOOD, IMMUNOCHEMICAL: Fecal Occult Bld: NEGATIVE

## 2021-02-19 ENCOUNTER — Telehealth: Payer: Self-pay | Admitting: Family Medicine

## 2021-02-19 DIAGNOSIS — I10 Essential (primary) hypertension: Secondary | ICD-10-CM

## 2021-02-19 MED ORDER — AMLODIPINE BESYLATE 5 MG PO TABS: 5.0000 mg | ORAL_TABLET | Freq: Every day | ORAL | 0 refills | Status: DC

## 2021-02-19 NOTE — Telephone Encounter (Signed)
  Encourage patient to contact the pharmacy for refills or they can request refills through [facility]  LAST APPOINTMENT DATE:  Please schedule appointment if longer than 1 year  NEXT APPOINTMENT DATE:no future appt  MEDICATION:amLODipine (NORVASC) 5 MG tablet  Is the patient out of medication? no  PHARMACY:[facility] [address] [place] [address] [phone]  Let patient know to contact pharmacy at the end of the day to make sure medication is ready.  Please notify patient to allow 48-72 hours to process  CLINICAL FILLS OUT ALL BELOW:   LAST REFILL:  QTY:  REFILL DATE:    OTHER COMMENTS:    Okay for refill?  Please advise

## 2021-08-29 ENCOUNTER — Ambulatory Visit (INDEPENDENT_AMBULATORY_CARE_PROVIDER_SITE_OTHER): Payer: 59 | Admitting: Family Medicine

## 2021-08-29 VITALS — BP 120/80 | HR 82 | Temp 97.2°F | Ht 61.75 in | Wt 134.1 lb

## 2021-08-29 DIAGNOSIS — M7632 Iliotibial band syndrome, left leg: Secondary | ICD-10-CM | POA: Insufficient documentation

## 2021-08-29 DIAGNOSIS — Z0000 Encounter for general adult medical examination without abnormal findings: Secondary | ICD-10-CM

## 2021-08-29 DIAGNOSIS — I10 Essential (primary) hypertension: Secondary | ICD-10-CM

## 2021-08-29 DIAGNOSIS — R7303 Prediabetes: Secondary | ICD-10-CM

## 2021-08-29 DIAGNOSIS — E781 Pure hyperglyceridemia: Secondary | ICD-10-CM

## 2021-08-29 DIAGNOSIS — H60543 Acute eczematoid otitis externa, bilateral: Secondary | ICD-10-CM

## 2021-08-29 DIAGNOSIS — Z1231 Encounter for screening mammogram for malignant neoplasm of breast: Secondary | ICD-10-CM

## 2021-08-29 DIAGNOSIS — Z1211 Encounter for screening for malignant neoplasm of colon: Secondary | ICD-10-CM

## 2021-08-29 LAB — COMPREHENSIVE METABOLIC PANEL
ALT: 53 U/L — ABNORMAL HIGH (ref 0–35)
AST: 32 U/L (ref 0–37)
Albumin: 4.6 g/dL (ref 3.5–5.2)
Alkaline Phosphatase: 64 U/L (ref 39–117)
BUN: 12 mg/dL (ref 6–23)
CO2: 30 mEq/L (ref 19–32)
Calcium: 10 mg/dL (ref 8.4–10.5)
Chloride: 104 mEq/L (ref 96–112)
Creatinine, Ser: 0.75 mg/dL (ref 0.40–1.20)
GFR: 92.92 mL/min (ref >60.00)
Glucose, Bld: 102 mg/dL — ABNORMAL HIGH (ref 70–99)
Potassium: 4 mEq/L (ref 3.5–5.1)
Sodium: 141 mEq/L (ref 135–145)
Total Bilirubin: 1.5 mg/dL — ABNORMAL HIGH (ref 0.2–1.2)
Total Protein: 7.8 g/dL (ref 6.0–8.3)

## 2021-08-29 LAB — LIPID PANEL
Cholesterol: 176 mg/dL (ref 0–200)
HDL: 45.3 mg/dL (ref >39.00)
LDL Cholesterol: 115 mg/dL — ABNORMAL HIGH (ref 0–99)
NonHDL: 131.19
Total CHOL/HDL Ratio: 4
Triglycerides: 83 mg/dL (ref 0.0–149.0)
VLDL: 16.6 mg/dL (ref 0.0–40.0)

## 2021-08-29 LAB — HEMOGLOBIN A1C: Hgb A1c MFr Bld: 6.7 % — ABNORMAL HIGH (ref 4.6–6.5)

## 2021-08-29 MED ORDER — AMLODIPINE BESYLATE 5 MG PO TABS: 5.0000 mg | ORAL_TABLET | Freq: Every day | ORAL | 3 refills | Status: DC

## 2021-08-29 MED ORDER — HYDROCORTISONE-ACETIC ACID 1-2 % OT SOLN: 4.0000 [drp] | Freq: Two times a day (BID) | OTIC | 0 refills | Status: DC

## 2021-08-29 NOTE — Assessment & Plan Note (Signed)
Ear drops. F/u if not improved

## 2021-08-29 NOTE — Progress Notes (Signed)
Annual Exam   Chief Complaint:  Chief Complaint  Patient presents with   Annual Exam    History of Present Illness:  [name] is a 51 y.o. No obstetric history on file. who LMP was No LMP recorded. Patient is perimenopausal., presents today for her annual examination.    Itchy ears/dry skin x 1 month  Pain on the left thigh - tingling and numbness - worse with standing for a long time - will get hot sensation and itching sensation - no rash - massage - now a smaller are at the lateral knee - no back pain - symptoms x 1 month  Nutrition She does get adequate calcium and Vitamin D in her diet. Diet: generally healthy  Exercise: 30-60 minutes of exercise    Social History   Tobacco Use  Smoking Status Never  Smokeless Tobacco Never   Social History   Substance and Sexual Activity  Alcohol Use Not Currently   Social History   Substance and Sexual Activity  Drug Use No     General Health Dentist in the last year: Yes Eye doctor: yes  Safety The patient wears seatbelts: yes.     The patient feels safe at home and in their relationships: yes.   Menstrual:  Symptoms of menopause: hot flashes Still having some spotting  GYN She is single partner, contraception - none.    Cervical Cancer Screening (21-65):   Last Pap:   April 2022 Results were: no abnormalities /neg HPV DNA   Breast Cancer Screening (Age 49-74):  There is no FH of breast cancer. There is no FH of ovarian cancer. BRCA screening Not Indicated.  Last Mammogram: 2015 The patient does want a mammogram this year.    Colon Cancer Screening:  Age 55-75 yo - benefits outweigh the risk. Adults 7-85 yo who have never been screened benefit.  Benefits: 134000 people in 2016 will be diagnosed and 49,000 will die - early detection helps Harms: Complications 2/2 to colonoscopy High Risk (Colonoscopy): genetic disorder (Lynch syndrome or familial adenomatous polyposis), personal hx of IBD,  previous adenomatous polyp, or previous colorectal cancer, FamHx start 10 years before the age at diagnosis, increased in males and black race  Options:  FIT - looks for hemoglobin (blood in the stool) - specific and fairly sensitive - must be done annually Cologuard - looks for DNA and blood - more sensitive - therefore can have more false positives, every 3 years Colonoscopy - every 10 years if normal - sedation, bowl prep, must have someone drive you  Shared decision making and the patient had decided to do fobt.   Social History   Tobacco Use  Smoking Status Never  Smokeless Tobacco Never    Lung Cancer Screening (Ages 09-23): not applicable   Weight Wt Readings from Last 3 Encounters:  08/29/21 134 lb 2 oz (60.8 kg)  06/26/20 137 lb (62.1 kg)  01/19/20 135 lb (61.2 kg)   Patient has normal BMI  BMI Readings from Last 1 Encounters:  08/29/21 24.73 kg/m     Chronic disease screening Blood pressure monitoring:  BP Readings from Last 3 Encounters:  08/29/21 120/80  06/26/20 124/72  01/19/20 127/72    Lipid Monitoring: Indication for screening: age >70, obesity, diabetes, family hx, CV risk factors.  Lipid screening: Yes  Lab Results  Component Value Date   CHOL 165 06/26/2020   HDL 43.40 06/26/2020   LDLCALC 88 06/26/2020   TRIG 166.0 (H) 06/26/2020  CHOLHDL 4 06/26/2020     Diabetes Screening: age >73, overweight, family hx, PCOS, hx of gestational diabetes, at risk ethnicity Diabetes Screening screening: Yes  Lab Results  Component Value Date   HGBA1C 6.2 06/26/2020     Past Medical History:  Diagnosis Date   Abdominal pain, left lower quadrant 05/15/2007   ALLERGIC RHINITIS 05/17/2007   GANGLION CYST, WRIST, RIGHT 05/31/2008   OAB (overactive bladder)    OVERACTIVE BLADDER 05/15/2007    Past Surgical History:  Procedure Laterality Date   CHOLECYSTECTOMY      Prior to Admission medications   Medication Sig Start Date End Date Taking?  Authorizing Provider  amLODipine (NORVASC) 5 MG tablet Take 1 tablet (5 mg total) by mouth daily. 02/19/21  Yes Lesleigh Noe, MD    Allergies  Allergen Reactions   Cetirizine Hcl     REACTION: insomnia    Gynecologic History: No LMP recorded. Patient is perimenopausal.  Obstetric History: No obstetric history on file.  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, 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      Family: son - Secretary/administrator (2005)      Enjoys: hiking, fishing      Exercise: walking 30 minutes a few times a week, hiking once a week   Diet: low sodium, veggies and less meat      Safety   Seat belts: Yes    Guns: Yes  and secure   Safe in relationships: Yes    Social Determinants of Radio broadcast assistant Strain: Not on file  Food Insecurity: Not on file  Transportation Needs: Not on file  Physical Activity: Not on file  Stress: Not on file  Social Connections: Not on file  Intimate Partner Violence: Not on file    Family History  Problem Relation Age of Onset   Stroke Father 59   Hypertension Father    Hypertension Brother        4 brothers with HTN   Stroke Brother 75   Transient ischemic attack Brother 55    Review of Systems  Constitutional:  Negative for chills and fever.  HENT:  Negative for congestion and sore throat.        Itchy ears  Eyes:  Negative for blurred  vision and double vision.  Respiratory:  Negative for shortness of breath.   Cardiovascular:  Negative for chest pain.  Gastrointestinal:  Negative for heartburn, nausea and vomiting.  Genitourinary: Negative.   Musculoskeletal: Negative.  Negative for myalgias.  Skin:  Negative for rash.  Neurological:  Positive for sensory change. Negative for dizziness and headaches.  Endo/Heme/Allergies:  Does not bruise/bleed easily.  Psychiatric/Behavioral:  Negative for depression. The patient is not nervous/anxious.     Physical Exam BP 120/80   Pulse  82   Temp (!) 97.2 F (36.2 C) (Temporal)   Ht 5' 1.75" (1.568 m)   Wt 134 lb 2 oz (60.8 kg)   SpO2 98%   BMI 24.73 kg/m    BP Readings from Last 3 Encounters:  08/29/21 120/80  06/26/20 124/72  01/19/20 127/72      Physical Exam Constitutional:      General: She is not in acute distress.    Appearance: She is well-developed. She is not diaphoretic.  HENT:     Head: Normocephalic and atraumatic.     Right Ear: External ear normal.     Left Ear: External ear normal.     Nose: Nose normal.  Eyes:     General: No scleral icterus.    Extraocular Movements: Extraocular movements intact.     Conjunctiva/sclera: Conjunctivae normal.  Cardiovascular:     Rate and Rhythm: Normal rate and regular rhythm.     Heart sounds: No murmur heard. Pulmonary:     Effort: Pulmonary effort is normal. No respiratory distress.     Breath sounds: Normal breath sounds. No wheezing.  Abdominal:     General: Bowel sounds are normal. There is no distension.     Palpations: Abdomen is soft. There is no mass.     Tenderness: There is no abdominal tenderness. There is no guarding or rebound.  Musculoskeletal:        General: Normal range of motion.     Cervical back: Neck supple.     Comments: Back Inspection: no abnormalities Palpation: No spinous process ttp, no Paraspinous ttp, TTP along the left IT band which reproduces discomfort Strength: Normal  bilateral hip flexion/extension, knee flexion/extension, dorsiflexion/extension Straight leg raise - negative b/l    Lymphadenopathy:     Cervical: No cervical adenopathy.  Skin:    General: Skin is warm and dry.     Capillary Refill: Capillary refill takes less than 2 seconds.  Neurological:     Mental Status: She is alert and oriented to person, place, and time.     Deep Tendon Reflexes: Reflexes normal.  Psychiatric:        Mood and Affect: Mood normal.        Behavior: Behavior normal.    Results:  PHQ-9:  [place] Office Visit from 06/26/2020 in [place] at [place]  PHQ-9 Total Score 1         Assessment: 52 y.o. No obstetric history on file. female here for routine annual physical examination.  Plan: Problem List Items Addressed This Visit       Cardiovascular and Mediastinum   Essential hypertension    Controlled. Cont amlodipine 5 mg       Relevant Medications   amLODipine (NORVASC) 5 MG tablet   Other Relevant Orders   Comprehensive metabolic panel     Nervous and Auditory   Dermatitis of both ear canals    Ear drops. F/u if not improved       Relevant Medications   acetic acid-hydrocortisone (VOSOL-HC) OTIC solution     Musculoskeletal and Integument   It band syndrome, left    Advised cupping and stretching         Other   Prediabetes   Relevant Orders   Hemoglobin A1c   Other Visit Diagnoses     Annual physical exam    -  Primary   Hypertriglyceridemia       Relevant Medications   amLODipine (NORVASC) 5 MG tablet  Other Relevant Orders   Lipid panel   Encounter for screening mammogram for malignant neoplasm of breast       Relevant Orders   MM Digital Screening   Screening for colon cancer       Relevant Orders   Fecal occult blood, imunochemical       Screening: -- Blood pressure screen normal -- cholesterol screening: will obtain -- Weight screening: normal -- Diabetes Screening: will obtain --  Nutrition: Encouraged healthy diet  The 10-year ASCVD risk score (Arnett DK, et al., 2019) is: 1.5%   Values used to calculate the score:     Age: 89 years     Sex: Female     Is Non-Hispanic African American: No     Diabetic: No     Tobacco smoker: No     Systolic Blood Pressure: 677 mmHg     Is BP treated: Yes     HDL Cholesterol: 43.4 mg/dL     Total Cholesterol: 165 mg/dL  -- Statin therapy for Age 57-75 with CVD risk >7.5%  Psych -- Depression screening (PHQ-9):  [place] Visit from 06/26/2020 in [place] at [place]  PHQ-9 Total Score 1        Safety -- tobacco screening: not using -- alcohol screening:  low-risk usage. -- no evidence of domestic violence or intimate partner violence.   Cancer Screening -- pap smear not collected per ASCCP guidelines -- family history of breast cancer screening: done. not at high risk. -- Mammogram - ordered -- Colon cancer (age 24+)-- ordered  Immunizations Immunization History  Administered Date(s) Administered   Influenza Split 12/13/2010, 12/16/2011   Moderna Sars-Covid-2 Vaccination 03/23/2020   Td 09/27/2009    -- flu vaccine up to date -- TDAP q10 years not up to date - will get next year -- Shingles (age >31) not up to date - will get next year -- Covid-19 Vaccine up to date   Encouraged healthy diet and exercise. Encouraged regular vision and dental care.    Lesleigh Noe, MD

## 2021-08-29 NOTE — Assessment & Plan Note (Signed)
Controlled. Cont amlodipine 5 mg.  ?

## 2021-08-29 NOTE — Patient Instructions (Addendum)
Please call the location of your choice from the menu below to schedule your Mammogram and/or Bone Density appointment.    [facility]                      Phone:  [phone], [place] [address]                                                             Services: Traditional and 3D Mammogram, Bone Density   Iliotibial Band Syndrome Rehab Ask your health care provider which exercises are safe for you. Do exercises exactly as told by your health care provider and adjust them as directed. It is normal to feel mild stretching, pulling, tightness, or discomfort as you do these exercises. Stop right away if you feel sudden pain or your pain gets significantly worse. Do not begin these exercises until told by your health care provider. Stretching and range-of-motion exercises These exercises warm up your muscles and joints and improve the movement and flexibility of your hip and pelvis. Quadriceps stretch, prone  Lie on your abdomen (prone position) on a firm surface, such as a bed or padded floor. Bend your left / right knee and reach back to hold your ankle or pant leg. If you cannot reach your ankle or pant leg, loop a belt around your foot and grab the belt instead. Gently pull your heel toward your buttocks. Your knee should not slide out to the side. You should feel a stretch in the front of your thigh and knee (quadriceps). Hold this position for __________ seconds. Repeat __________ times. Complete this exercise __________ times a day. Iliotibial band stretch An iliotibial band is a strong band of muscle tissue that runs from the outer side of your hip to the outer side of your thigh and knee. Lie on your side with your left / right leg in the top position. Bend both of your knees and grab your left / right ankle. Stretch out your bottom arm to help you balance. Slowly bring your top knee  back so your thigh goes behind your trunk. Slowly lower your top leg toward the floor until you feel a gentle stretch on the outside of your left / right hip and thigh. If you do not feel a stretch and your knee will not fall farther, place the heel of your other foot on top of your knee and pull your knee down toward the floor with your foot. Hold this position for __________ seconds. Repeat __________ times. Complete this exercise __________ times a day. Strengthening exercises These exercises build strength and endurance in your hip and pelvis. Endurance is the ability to use your muscles for a long time, even after they get tired. Straight leg raises, side-lying This exercise strengthens the muscles that rotate the leg at the hip and move it away from your body (hip abductors). Lie on your side with your left / right leg in the top position. Lie so your head, shoulder, hip, and knee line up. You may bend  your bottom knee to help you balance. Roll your hips slightly forward so your hips are stacked directly over each other and your left / right knee is facing forward. Tense the muscles in your outer thigh and lift your top leg 4-6 inches (10-15 cm). Hold this position for __________ seconds. Slowly lower your leg to return to the starting position. Let your muscles relax completely before doing another repetition. Repeat __________ times. Complete this exercise __________ times a day. Leg raises, prone This exercise strengthens the muscles that move the hips backward (hip extensors). Lie on your abdomen (prone position) on your bed or a firm surface. You can put a pillow under your hips if that is more comfortable for your lower back. Bend your left / right knee so your foot is straight up in the air. Squeeze your buttocks muscles and lift your left / right thigh off the bed. Do not let your back arch. Tense your thigh muscle as hard as you can without increasing any knee pain. Hold this  position for __________ seconds. Slowly lower your leg to return to the starting position and allow it to relax completely. Repeat __________ times. Complete this exercise __________ times a day. Hip hike Stand sideways on a bottom step. Stand on your left / right leg with your other foot unsupported next to the step. You can hold on to a railing or wall for balance if needed. Keep your knees straight and your torso square. Then lift your left / right hip up toward the ceiling. Slowly let your left / right hip lower toward the floor, past the starting position. Your foot should get closer to the floor. Do not lean or bend your knees. Repeat __________ times. Complete this exercise __________ times a day. This information is not intended to replace advice given to you by your health care provider. Make sure you discuss any questions you have with your health care provider. Document Revised: 05/19/2019 Document Reviewed: 05/19/2019 Elsevier Patient Education  [place].

## 2021-08-29 NOTE — Assessment & Plan Note (Signed)
Advised cupping and stretching

## 2021-08-30 ENCOUNTER — Encounter: Payer: Self-pay | Admitting: Family Medicine

## 2021-08-30 ENCOUNTER — Telehealth: Payer: Self-pay | Admitting: Family Medicine

## 2021-08-30 ENCOUNTER — Other Ambulatory Visit (INDEPENDENT_AMBULATORY_CARE_PROVIDER_SITE_OTHER): Payer: 59

## 2021-08-30 DIAGNOSIS — Z1211 Encounter for screening for malignant neoplasm of colon: Secondary | ICD-10-CM

## 2021-08-30 NOTE — Telephone Encounter (Signed)
Patient came in and dropped off copy of Covid-19 vaccination and has been placed in PCP folder.

## 2021-08-31 LAB — FECAL OCCULT BLOOD, IMMUNOCHEMICAL: Fecal Occult Bld: NEGATIVE

## 2021-08-31 NOTE — Telephone Encounter (Signed)
Chart updated with covid vaccines

## 2021-10-05 ENCOUNTER — Ambulatory Visit
Admission: RE | Admit: 2021-10-05 | Discharge: 2021-10-05 | Disposition: A | Discharge status: Home / Self Care | Payer: 59 | Source: Ambulatory Visit | Attending: Family Medicine | Admitting: Family Medicine

## 2021-10-05 DIAGNOSIS — Z1231 Encounter for screening mammogram for malignant neoplasm of breast: Secondary | ICD-10-CM

## 2021-10-10 ENCOUNTER — Other Ambulatory Visit: Payer: Self-pay | Admitting: Family Medicine

## 2021-10-10 DIAGNOSIS — R928 Other abnormal and inconclusive findings on diagnostic imaging of breast: Secondary | ICD-10-CM

## 2021-11-01 ENCOUNTER — Other Ambulatory Visit: Payer: 59

## 2021-11-14 ENCOUNTER — Ambulatory Visit
Admission: RE | Admit: 2021-11-14 | Discharge: 2021-11-14 | Disposition: A | Discharge status: Home / Self Care | Payer: 59 | Source: Ambulatory Visit | Attending: Family Medicine | Admitting: Family Medicine

## 2021-11-14 ENCOUNTER — Ambulatory Visit: Payer: 59

## 2021-11-14 DIAGNOSIS — R928 Other abnormal and inconclusive findings on diagnostic imaging of breast: Secondary | ICD-10-CM

## 2021-12-07 ENCOUNTER — Telehealth: Payer: Self-pay | Admitting: Family Medicine

## 2021-12-07 ENCOUNTER — Ambulatory Visit (INDEPENDENT_AMBULATORY_CARE_PROVIDER_SITE_OTHER): Payer: 59 | Admitting: Family Medicine

## 2021-12-07 VITALS — BP 100/60 | HR 73 | Temp 97.0°F | Wt 128.4 lb

## 2021-12-07 DIAGNOSIS — R7303 Prediabetes: Secondary | ICD-10-CM | POA: Diagnosis not present

## 2021-12-07 DIAGNOSIS — E782 Mixed hyperlipidemia: Secondary | ICD-10-CM

## 2021-12-07 DIAGNOSIS — E1169 Type 2 diabetes mellitus with other specified complication: Secondary | ICD-10-CM | POA: Insufficient documentation

## 2021-12-07 DIAGNOSIS — E119 Type 2 diabetes mellitus without complications: Secondary | ICD-10-CM | POA: Diagnosis not present

## 2021-12-07 DIAGNOSIS — I10 Essential (primary) hypertension: Secondary | ICD-10-CM | POA: Diagnosis not present

## 2021-12-07 DIAGNOSIS — E1129 Type 2 diabetes mellitus with other diabetic kidney complication: Secondary | ICD-10-CM | POA: Insufficient documentation

## 2021-12-07 HISTORY — DX: Type 2 diabetes mellitus without complications: E11.9

## 2021-12-07 LAB — COMPREHENSIVE METABOLIC PANEL
ALT: 22 U/L (ref 0–35)
AST: 18 U/L (ref 0–37)
Albumin: 4.5 g/dL (ref 3.5–5.2)
Alkaline Phosphatase: 66 U/L (ref 39–117)
BUN: 12 mg/dL (ref 6–23)
CO2: 30 mEq/L (ref 19–32)
Calcium: 9.8 mg/dL (ref 8.4–10.5)
Chloride: 104 mEq/L (ref 96–112)
Creatinine, Ser: 0.73 mg/dL (ref 0.40–1.20)
GFR: 95.8 mL/min (ref >60.00)
Glucose, Bld: 97 mg/dL (ref 70–99)
Potassium: 3.9 mEq/L (ref 3.5–5.1)
Sodium: 140 mEq/L (ref 135–145)
Total Bilirubin: 0.9 mg/dL (ref 0.2–1.2)
Total Protein: 7.8 g/dL (ref 6.0–8.3)

## 2021-12-07 LAB — LIPID PANEL
Cholesterol: 196 mg/dL (ref 0–200)
HDL: 47.2 mg/dL (ref >39.00)
LDL Cholesterol: 123 mg/dL — ABNORMAL HIGH (ref 0–99)
NonHDL: 148.75
Total CHOL/HDL Ratio: 4
Triglycerides: 127 mg/dL (ref 0.0–149.0)
VLDL: 25.4 mg/dL (ref 0.0–40.0)

## 2021-12-07 LAB — POCT GLYCOSYLATED HEMOGLOBIN (HGB A1C): Hemoglobin A1C: 5.7 % — AB (ref 4.0–5.6)

## 2021-12-07 LAB — MICROALBUMIN / CREATININE URINE RATIO
Creatinine,U: 111.4 mg/dL
Microalb Creat Ratio: 1.8 mg/g (ref 0.0–30.0)
Microalb, Ur: 2 mg/dL — ABNORMAL HIGH (ref 0.0–1.9)

## 2021-12-07 NOTE — Telephone Encounter (Signed)
Ok to schedule with me. Thank you.  

## 2021-12-07 NOTE — Assessment & Plan Note (Signed)
Blood pressure is low normal, given recent weight loss, regular exercise, and healthy diet discussed a trial of home monitoring off of amlodipine.  She will stop amlodipine, follow-up in 2 weeks if blood pressure is elevated.  Discussed if blood pressure does remain elevated we may want to consider an ACE or an ARB given diabetes diagnosis.

## 2021-12-07 NOTE — Patient Instructions (Addendum)
#  Blood pressure - check blood pressure at home - Stop Amlodipine - Call or update - if blood pressure >140/90 to consider either starting Lisinopril or restarting amlodipine    Controlling diabetes is important for improving your overall health.   Lab Results  Component Value Date   HGBA1C 5.7 (A) 12/07/2021    The ideal Hemoglobin A1c is <7%   Diabetes can lead to: Kidney disease, vision issues, high blood pressure.    How can you treat your diabetes 1) Intensive Lifestyle Program - [facility]  2) Regular exercise - 30 minutes of moderate activity, 5 times a week 3) Weight loss - only 7% 4) Modifying your diet - limit food high in sugar - go to [email or web address] to learn more 5) Medication 6) Consider Bariatric Surgery if candidate   Every Year you should have the following:  1) Foot exam 2) Special diabetes eye exam - send [place] the result when you get this done 3) Urine test to look for signs of kidney disease    If you have kidney disease related to diabetes this is how we can treat it 1) Control your diabetes - first with metformin if tolerated 2) If you have high blood pressure - Take a medication that is an ACE inhibitor or ARB (like lisinopril or losartan)

## 2021-12-07 NOTE — Telephone Encounter (Signed)
Patient has been scheduled

## 2021-12-07 NOTE — Progress Notes (Signed)
Subjective:     Becky Shaw is a 51 y.o. female presenting for Follow-up (A1C and cholesterol)     HPI  #Diabetes Currently taking nothing  Using medications without difficulties: n/a  Feet problems:No  Blood Sugars averaging: does not check Last HgbA1c:  Lab Results  Component Value Date   HGBA1C 5.7 (A) 12/07/2021    Diabetes Health Maintenance Due:    Diabetes Health Maintenance Due  Topic Date Due   OPHTHALMOLOGY EXAM  Never done   HEMOGLOBIN A1C  06/07/2022   FOOT EXAM  12/08/2022   Sees the eye doctor every 6 months  Overall feeling well - has lost weight and feels good  Has been exercising 4-5 miles   #HTN - occasionally checks bp - 100-120/70-80  Review of Systems   Social History   Tobacco Use  Smoking Status Never  Smokeless Tobacco Never        Objective:    BP Readings from Last 3 Encounters:  12/07/21 100/60  08/29/21 120/80  06/26/20 124/72   Wt Readings from Last 3 Encounters:  12/07/21 128 lb 6 oz (58.2 kg)  08/29/21 134 lb 2 oz (60.8 kg)  06/26/20 137 lb (62.1 kg)    BP 100/60   Pulse 73   Temp (!) 97 F (36.1 C) (Temporal)   Wt 128 lb 6 oz (58.2 kg)   SpO2 97%   BMI 23.67 kg/m    Physical Exam Constitutional:      General: She is not in acute distress.    Appearance: She is well-developed. She is not diaphoretic.  HENT:     Right Ear: External ear normal.     Left Ear: External ear normal.     Nose: Nose normal.  Eyes:     Conjunctiva/sclera: Conjunctivae normal.  Cardiovascular:     Rate and Rhythm: Normal rate and regular rhythm.  Pulmonary:     Effort: Pulmonary effort is normal. No respiratory distress.     Breath sounds: Normal breath sounds. No wheezing.  Musculoskeletal:     Cervical back: Neck supple.  Skin:    General: Skin is warm and dry.     Capillary Refill: Capillary refill takes less than 2 seconds.  Neurological:     Mental Status: She is alert. Mental status is at baseline.   Psychiatric:        Mood and Affect: Mood normal.        Behavior: Behavior normal.           Assessment & Plan:   Problem List Items Addressed This Visit       Cardiovascular and Mediastinum   Essential hypertension    Blood pressure is low normal, given recent weight loss, regular exercise, and healthy diet discussed a trial of home monitoring off of amlodipine.  She will stop amlodipine, follow-up in 2 weeks if blood pressure is elevated.  Discussed if blood pressure does remain elevated we may want to consider an ACE or an ARB given diabetes diagnosis.      Relevant Orders   Comprehensive metabolic panel     Endocrine   Diet-controlled diabetes mellitus (HCC) - Primary    Lab Results  Component Value Date   HGBA1C 5.7 (A) 12/07/2021  Following diabetic diet. Doing well and feeling good.       Relevant Orders   POCT glycosylated hemoglobin (Hb A1C) (Completed)   Microalbumin / creatinine urine ratio     Other   Hyperlipidemia  Discussed goal of less than 70 for patients with diabetes, however given her significant improvement if she gets less than 100 it might be reasonable to watch and wait prior to starting a statin.  Discussed benefit of statin for prevention of heart attack and stroke.      Relevant Orders   Lipid panel   RESOLVED: Prediabetes     Return in about 3 months (around 03/08/2022) for TOC with Dr. Ermalene Searing .  Lynnda Child, MD

## 2021-12-07 NOTE — Assessment & Plan Note (Signed)
Lab Results  Component Value Date   HGBA1C 5.7 (A) 12/07/2021   Following diabetic diet. Doing well and feeling good.

## 2021-12-07 NOTE — Assessment & Plan Note (Signed)
Discussed goal of less than 70 for patients with diabetes, however given her significant improvement if she gets less than 100 it might be reasonable to watch and wait prior to starting a statin.  Discussed benefit of statin for prevention of heart attack and stroke.

## 2021-12-07 NOTE — Telephone Encounter (Signed)
Patient's husband and son see Dr. Reece Agar   Requesting to [facility] to Dr. Reece Agar- sending for review

## 2021-12-11 ENCOUNTER — Encounter: Payer: Self-pay | Admitting: Family Medicine

## 2022-03-08 ENCOUNTER — Ambulatory Visit: Payer: 59 | Admitting: Family Medicine

## 2022-03-08 ENCOUNTER — Encounter: Payer: Self-pay | Admitting: Family Medicine

## 2022-03-08 VITALS — BP 160/100 | HR 67 | Temp 97.2°F | Ht 61.75 in | Wt 131.0 lb

## 2022-03-08 DIAGNOSIS — R7303 Prediabetes: Secondary | ICD-10-CM

## 2022-03-08 DIAGNOSIS — E782 Mixed hyperlipidemia: Secondary | ICD-10-CM | POA: Diagnosis not present

## 2022-03-08 DIAGNOSIS — I10 Essential (primary) hypertension: Secondary | ICD-10-CM | POA: Diagnosis not present

## 2022-03-08 DIAGNOSIS — Z823 Family history of stroke: Secondary | ICD-10-CM | POA: Diagnosis not present

## 2022-03-08 MED ORDER — AMLODIPINE BESYLATE 5 MG PO TABS: 5.0000 mg | ORAL_TABLET | Freq: Every day | ORAL | 1 refills | Status: DC

## 2022-03-08 NOTE — Progress Notes (Signed)
Patient ID: Becky Shaw, female    DOB: [birth year], 51 y.o.   MRN: [id number]  This visit was conducted in person.  BP (!) 160/100 (BP Location: Left Arm, Cuff Size: Normal)   Pulse 67   Temp (!) 97.2 F (36.2 C) (Temporal)   Ht 5' 1.75" (1.568 m)   Wt 131 lb (59.4 kg)   SpO2 99%   BMI 24.15 kg/m   BP Readings from Last 3 Encounters:  03/08/22 (!) 160/100  12/07/21 100/60  08/29/21 120/80    CC: transfer care Subjective:   HPI: Becky Shaw is a 51 y.o. female presenting on 03/08/2022 for Transitions Of Care (TOC from Dr. Selena Batten. Pt states Dr. Selena Batten mentioned protein in her urine in 12/07/21 labs and should have urine rechecked next year. )   Previously saw Dr Selena Batten. Last CPE 08/2021.   H/o diabetes A1c 6.7%, controlled with healthy diet and lifestyle changes, latest check in prediabetes range! She changed diet, she's been walking regularly 45 min daily, started hiking hobby which she enjoys.   H/o HTN - BP well controlled off medications recently. Home readings overal well controlled. Strong fmhx hypertension and stroke.  Denies recent increased salt/sodium in the diet.  She continues regular aerobic exercise daily.  Strong fmhx stroke - father and brother passed away from strokes.   Mammogram 09/2021 - returned for diagnostic mammo bilaterally and R breast US - which returned showing benign breast cysts - rec rpt screening mammogram 09/2022.      Relevant past medical, surgical, family and social history reviewed and updated as indicated. Interim medical history since our last visit reviewed. Allergies and medications reviewed and updated. Outpatient Medications Prior to Visit  Medication Sig Dispense Refill   acetic acid-hydrocortisone (VOSOL-HC) OTIC solution Place 4 drops into both ears 2 (two) times daily. (Patient taking differently: Place 4 drops into both ears as needed (itching).) 10 mL 0   No facility-administered medications prior to visit.     Per HPI unless  specifically indicated in ROS section below Review of Systems  Objective:  BP (!) 160/100 (BP Location: Left Arm, Cuff Size: Normal)   Pulse 67   Temp (!) 97.2 F (36.2 C) (Temporal)   Ht 5' 1.75" (1.568 m)   Wt 131 lb (59.4 kg)   SpO2 99%   BMI 24.15 kg/m   Wt Readings from Last 3 Encounters:  03/08/22 131 lb (59.4 kg)  12/07/21 128 lb 6 oz (58.2 kg)  08/29/21 134 lb 2 oz (60.8 kg)      Physical Exam Vitals and nursing note reviewed.  Constitutional:      Appearance: Normal appearance. She is not ill-appearing.  HENT:     Head: Normocephalic and atraumatic.     Mouth/Throat:     Comments: Wearing mask Eyes:     Extraocular Movements: Extraocular movements intact.     Conjunctiva/sclera: Conjunctivae normal.     Pupils: Pupils are equal, round, and reactive to light.  Cardiovascular:     Rate and Rhythm: Normal rate and regular rhythm.     Pulses: Normal pulses.     Heart sounds: Normal heart sounds. No murmur heard. Pulmonary:     Effort: Pulmonary effort is normal. No respiratory distress.     Breath sounds: Normal breath sounds. No wheezing, rhonchi or rales.  Musculoskeletal:     Right lower leg: No edema.     Left lower leg: No edema.  Skin:    General: Skin is  warm and dry.     Findings: No rash.  Neurological:     Mental Status: She is alert.  Psychiatric:        Mood and Affect: Mood normal.        Behavior: Behavior normal.       Results for orders placed or performed in visit on 12/07/21  Microalbumin / creatinine urine ratio  Result Value Ref Range   Microalb, Ur 2.0 (H) 0.0 - 1.9 mg/dL   Creatinine,U 924.2 mg/dL   Microalb Creat Ratio 1.8 0.0 - 30.0 mg/g  Comprehensive metabolic panel  Result Value Ref Range   Sodium 140 135 - 145 mEq/L   Potassium 3.9 3.5 - 5.1 mEq/L   Chloride 104 96 - 112 mEq/L   CO2 30 19 - 32 mEq/L   Glucose, Bld 97 70 - 99 mg/dL   BUN 12 6 - 23 mg/dL   Creatinine, Ser 6.83 0.40 - 1.20 mg/dL   Total Bilirubin 0.9 0.2  - 1.2 mg/dL   Alkaline Phosphatase 66 39 - 117 U/L   AST 18 0 - 37 U/L   ALT 22 0 - 35 U/L   Total Protein 7.8 6.0 - 8.3 g/dL   Albumin 4.5 3.5 - 5.2 g/dL   GFR 41.96 >22.29 mL/min   Calcium 9.8 8.4 - 10.5 mg/dL  Lipid panel  Result Value Ref Range   Cholesterol 196 0 - 200 mg/dL   Triglycerides 798.9 0.0 - 149.0 mg/dL   HDL 21.19 >41.74 mg/dL   VLDL 08.1 0.0 - 44.8 mg/dL   LDL Cholesterol 185 (H) 0 - 99 mg/dL   Total CHOL/HDL Ratio 4    NonHDL 148.75   POCT glycosylated hemoglobin (Hb A1C)  Result Value Ref Range   Hemoglobin A1C 5.7 (A) 4.0 - 5.6 %   HbA1c POC (<> result, manual entry)     HbA1c, POC (prediabetic range)     HbA1c, POC (controlled diabetic range)      Assessment & Plan:   Problem List Items Addressed This Visit     Essential hypertension - Primary    Chronic, deteriorated.  Blood pressure is markedly elevated in office today.  No history of whitecoat hypertension however she notes home readings tend to run 130s to 140s systolic.  I did ask her to keep monitoring blood pressures and keep a log, sent in prescription for amlodipine 5 mg which is what she was previously on, advised to start if BP staying elevated at home. Return to clinic in 4 to 6 weeks for blood pressure follow-up.      Relevant Medications   amLODipine (NORVASC) 5 MG tablet   Other Relevant Orders   Basic metabolic panel   CBC with Differential/Platelet   TSH   Hyperlipidemia    Recent cholesterol levels overall reassuring, although with her strong family history of strokes may benefit from LDL goal less than 100. Will add lipoprotein a and apolipoprotein B for further risk stratification.  Discussed considering statin versus aspirin therapy.  Will avoid aspirin at this time given increased bleeding risk. The 10-year ASCVD risk score (Arnett DK, et al., 2019) is: 5.9%   Values used to calculate the score:     Age: 70 years     Sex: Female     Is Non-Hispanic African American: No      Diabetic: Yes     Tobacco smoker: No     Systolic Blood Pressure: 160 mmHg     Is BP  treated: Yes     HDL Cholesterol: 47.2 mg/dL     Total Cholesterol: 196 mg/dL      Relevant Medications   amLODipine (NORVASC) 5 MG tablet   Other Relevant Orders   Apolipoprotein B   Lipid panel   Lipoprotein A (LPA)   Prediabetes    Following health diet and lifestyle changes, she has been able to go from diet controlled diabetes to prediabetes.  Congratulated.  She is motivated to continue healthy diet and lifestyle choices.      Family history of stroke (cerebrovascular)    Strong for history of father and brothers. Check lipoprotein a levels as per above.      Relevant Orders   Apolipoprotein B   Lipoprotein A (LPA)     Meds ordered this encounter  Medications   amLODipine (NORVASC) 5 MG tablet    Sig: Take 1 tablet (5 mg total) by mouth daily.    Dispense:  90 tablet    Refill:  1   Orders Placed This Encounter  Procedures   Apolipoprotein B    Standing Status:   Future    Standing Expiration Date:   03/09/2023   Basic metabolic panel    Standing Status:   Future    Standing Expiration Date:   03/09/2023   CBC with Differential/Platelet    Standing Status:   Future    Standing Expiration Date:   03/09/2023   Lipid panel    Standing Status:   Future    Standing Expiration Date:   03/09/2023   Lipoprotein A (LPA)    Standing Status:   Future    Standing Expiration Date:   03/09/2023   TSH    Standing Status:   Future    Standing Expiration Date:   03/09/2023     Patient Instructions  BP was too high today - start monitoring daily at home, keep log, if consistently >140/90 start amlodipine 5mg  daily again - sent to pharmacy.  Labs today - we will be in touch with results. --> will return next week am for fasting labs Return in 4-6 weeks for hypertension follow up visit.   Follow up plan: Return in about 6 weeks (around 04/19/2022) for follow up visit.  04/21/2022, MD

## 2022-03-08 NOTE — Assessment & Plan Note (Signed)
Recent cholesterol levels overall reassuring, although with her strong family history of strokes may benefit from LDL goal less than 100. Will add lipoprotein a and apolipoprotein B for further risk stratification.  Discussed considering statin versus aspirin therapy.  Will avoid aspirin at this time given increased bleeding risk. The 10-year ASCVD risk score (Arnett DK, et al., 2019) is: 5.9%   Values used to calculate the score:     Age: 51 years     Sex: Female     Is Non-Hispanic African American: No     Diabetic: Yes     Tobacco smoker: No     Systolic Blood Pressure: 160 mmHg     Is BP treated: Yes     HDL Cholesterol: 47.2 mg/dL     Total Cholesterol: 196 mg/dL

## 2022-03-08 NOTE — Assessment & Plan Note (Signed)
Strong for history of father and brothers. Check lipoprotein a levels as per above.

## 2022-03-08 NOTE — Assessment & Plan Note (Signed)
Following health diet and lifestyle changes, she has been able to go from diet controlled diabetes to prediabetes.  Congratulated.  She is motivated to continue healthy diet and lifestyle choices.

## 2022-03-08 NOTE — Assessment & Plan Note (Signed)
Chronic, deteriorated.  Blood pressure is markedly elevated in office today.  No history of whitecoat hypertension however she notes home readings tend to run 130s to 140s systolic.  I did ask her to keep monitoring blood pressures and keep a log, sent in prescription for amlodipine 5 mg which is what she was previously on, advised to start if BP staying elevated at home. Return to clinic in 4 to 6 weeks for blood pressure follow-up.

## 2022-03-08 NOTE — Patient Instructions (Addendum)
BP was too high today - start monitoring daily at home, keep log, if consistently >140/90 start amlodipine 5mg  daily again - sent to pharmacy.  Labs today - we will be in touch with results. --> will return next week am for fasting labs Return in 4-6 weeks for hypertension follow up visit.

## 2022-03-12 ENCOUNTER — Other Ambulatory Visit (INDEPENDENT_AMBULATORY_CARE_PROVIDER_SITE_OTHER): Payer: 59

## 2022-03-12 DIAGNOSIS — Z823 Family history of stroke: Secondary | ICD-10-CM

## 2022-03-12 DIAGNOSIS — E782 Mixed hyperlipidemia: Secondary | ICD-10-CM | POA: Diagnosis not present

## 2022-03-12 DIAGNOSIS — I10 Essential (primary) hypertension: Secondary | ICD-10-CM

## 2022-03-12 LAB — CBC WITH DIFFERENTIAL/PLATELET
Basophils Absolute: 0 10*3/uL (ref 0.0–0.1)
Basophils Relative: 0.6 % (ref 0.0–3.0)
Eosinophils Absolute: 0.1 10*3/uL (ref 0.0–0.7)
Eosinophils Relative: 2 % (ref 0.0–5.0)
HCT: 47.1 % — ABNORMAL HIGH (ref 36.0–46.0)
Hemoglobin: 15.9 g/dL — ABNORMAL HIGH (ref 12.0–15.0)
Lymphocytes Relative: 39.5 % (ref 12.0–46.0)
Lymphs Abs: 2.1 10*3/uL (ref 0.7–4.0)
MCHC: 33.7 g/dL (ref 30.0–36.0)
MCV: 89.6 fl (ref 78.0–100.0)
Monocytes Absolute: 0.4 10*3/uL (ref 0.1–1.0)
Monocytes Relative: 6.7 % (ref 3.0–12.0)
Neutro Abs: 2.8 10*3/uL (ref 1.4–7.7)
Neutrophils Relative %: 51.2 % (ref 43.0–77.0)
Platelets: 201 10*3/uL (ref 150.0–400.0)
RBC: 5.26 Mil/uL — ABNORMAL HIGH (ref 3.87–5.11)
RDW: 12.4 % (ref 11.5–15.5)
WBC: 5.4 10*3/uL (ref 4.0–10.5)

## 2022-03-12 LAB — LIPID PANEL
Cholesterol: 215 mg/dL — ABNORMAL HIGH (ref 0–200)
HDL: 52.3 mg/dL (ref >39.00)
LDL Cholesterol: 136 mg/dL — ABNORMAL HIGH (ref 0–99)
NonHDL: 163.03
Total CHOL/HDL Ratio: 4
Triglycerides: 134 mg/dL (ref 0.0–149.0)
VLDL: 26.8 mg/dL (ref 0.0–40.0)

## 2022-03-12 LAB — TSH: TSH: 4.77 u[IU]/mL (ref 0.35–5.50)

## 2022-03-12 LAB — BASIC METABOLIC PANEL
BUN: 12 mg/dL (ref 6–23)
CO2: 28 mEq/L (ref 19–32)
Calcium: 10 mg/dL (ref 8.4–10.5)
Chloride: 103 mEq/L (ref 96–112)
Creatinine, Ser: 0.68 mg/dL (ref 0.40–1.20)
GFR: 101.27 mL/min (ref >60.00)
Glucose, Bld: 111 mg/dL — ABNORMAL HIGH (ref 70–99)
Potassium: 3.9 mEq/L (ref 3.5–5.1)
Sodium: 140 mEq/L (ref 135–145)

## 2022-03-13 LAB — APOLIPOPROTEIN B: Apolipoprotein B: 130 mg/dL — ABNORMAL HIGH (ref <90)

## 2022-03-16 LAB — LIPOPROTEIN A (LPA): Lipoprotein (a): 278 nmol/L — ABNORMAL HIGH (ref <75)

## 2022-03-18 ENCOUNTER — Encounter: Payer: Self-pay | Admitting: Family Medicine

## 2022-03-18 DIAGNOSIS — E039 Hypothyroidism, unspecified: Secondary | ICD-10-CM | POA: Insufficient documentation

## 2022-04-22 ENCOUNTER — Ambulatory Visit: Payer: 59 | Admitting: Family Medicine

## 2022-04-22 ENCOUNTER — Encounter: Payer: Self-pay | Admitting: Family Medicine

## 2022-04-22 VITALS — BP 118/72 | HR 74 | Temp 97.5°F | Ht 61.75 in | Wt 132.5 lb

## 2022-04-22 DIAGNOSIS — E7841 Elevated Lipoprotein(a): Secondary | ICD-10-CM

## 2022-04-22 DIAGNOSIS — Z823 Family history of stroke: Secondary | ICD-10-CM | POA: Diagnosis not present

## 2022-04-22 DIAGNOSIS — I10 Essential (primary) hypertension: Secondary | ICD-10-CM | POA: Diagnosis not present

## 2022-04-22 MED ORDER — AMLODIPINE BESYLATE 2.5 MG PO TABS: 2.5000 mg | ORAL_TABLET | Freq: Every day | ORAL | 3 refills | Status: DC

## 2022-04-22 MED ORDER — ROSUVASTATIN CALCIUM 20 MG PO TABS: 20.0000 mg | ORAL_TABLET | Freq: Every day | ORAL | 11 refills | Status: DC

## 2022-04-22 NOTE — Assessment & Plan Note (Signed)
Chronic, improving control, with some dizziness and diastolic orthostasis on testing today however without orthostatic dizziness symptoms. Encouraged good hydration status. Will drop amlodipine to 2.5mg  daily.

## 2022-04-22 NOTE — Progress Notes (Signed)
Patient ID: Becky Shaw, female    DOB: [birth year], 52 y.o.   MRN: [id number]  This visit was conducted in person.  BP 118/72 (Patient Position: Standing)   Pulse 74   Temp (!) 97.5 F (36.4 C) (Temporal)   Ht 5' 1.75" (1.568 m)   Wt 132 lb 8 oz (60.1 kg)   SpO2 97%   BMI 24.43 kg/m   BP Readings from Last 3 Encounters:  04/22/22 118/72  03/08/22 (!) 160/100  12/07/21 100/60   No data found.   Supine 122/82, standing 118/72.  On retesting 132/88   CC: 6 wk HTN /fu visit  Subjective:   HPI: Becky Shaw is a 52 y.o. female presenting on 04/22/2022 for Medical Management of Chronic Issues (Here for 6 wk f/u. Pt brought in home BP monitor to compare. Reading in office today- 137 98. C/o dizziness for a couple of days last week. )   See prior note for details. Transferred care last month from Dr Einar Pheasant - BP at that time was markedly elevated. Since then, started on amlodipine 5mg  daily which she takes every morning. Does check blood pressures at home and brings log: 110-130s/70-90s. She noted some lightheadedness last week as well as an episode of vertigo when laying down in bed. No lightheadedness when standing up. Some frontal headache as well. Denies vision changes, CP/tightness, SOB, leg swelling. She is staying well hydrated.   Strong fmhx stroke - both in father and brother (deceased).  Found to have elevated Apolipoprotein B (130), with markedly elevated Lp(a) levels (278!).   LMP = 06/2021 light spotting, last full period 2020. Ongoing hot flashes. She's been taking flax seed oil with benefit.      Relevant past medical, surgical, family and social history reviewed and updated as indicated. Interim medical history since our last visit reviewed. Allergies and medications reviewed and updated. Outpatient Medications Prior to Visit  Medication Sig Dispense Refill   acetic acid-hydrocortisone (VOSOL-HC) OTIC solution Place 4 drops into both ears 2 (two) times daily.  (Patient taking differently: Place 4 drops into both ears as needed (itching).) 10 mL 0   amLODipine (NORVASC) 5 MG tablet Take 1 tablet (5 mg total) by mouth daily. 90 tablet 1   No facility-administered medications prior to visit.     Per HPI unless specifically indicated in ROS section below Review of Systems  Objective:  BP 118/72 (Patient Position: Standing)   Pulse 74   Temp (!) 97.5 F (36.4 C) (Temporal)   Ht 5' 1.75" (1.568 m)   Wt 132 lb 8 oz (60.1 kg)   SpO2 97%   BMI 24.43 kg/m   Wt Readings from Last 3 Encounters:  04/22/22 132 lb 8 oz (60.1 kg)  03/08/22 131 lb (59.4 kg)  12/07/21 128 lb 6 oz (58.2 kg)      Physical Exam Vitals and nursing note reviewed.  Constitutional:      Appearance: Normal appearance. She is not ill-appearing.  HENT:     Head: Normocephalic and atraumatic.     Mouth/Throat:     Mouth: Mucous membranes are moist.     Pharynx: Oropharynx is clear. No oropharyngeal exudate or posterior oropharyngeal erythema.  Eyes:     Extraocular Movements: Extraocular movements intact.     Conjunctiva/sclera: Conjunctivae normal.     Pupils: Pupils are equal, round, and reactive to light.  Cardiovascular:     Rate and Rhythm: Normal rate and regular rhythm.  Pulses: Normal pulses.     Heart sounds: Normal heart sounds. No murmur heard. Pulmonary:     Effort: Pulmonary effort is normal. No respiratory distress.     Breath sounds: Normal breath sounds. No wheezing, rhonchi or rales.  Musculoskeletal:     Right lower leg: No edema.     Left lower leg: No edema.  Skin:    General: Skin is warm and dry.     Findings: No rash.  Neurological:     Mental Status: She is alert.  Psychiatric:        Mood and Affect: Mood normal.        Behavior: Behavior normal.       Results for orders placed or performed in visit on 03/12/22  TSH  Result Value Ref Range   TSH 4.77 0.35 - 5.50 uIU/mL  Lipoprotein A (LPA)  Result Value Ref Range    Lipoprotein (a) 278 (H) <75 nmol/L  Lipid panel  Result Value Ref Range   Cholesterol 215 (H) 0 - 200 mg/dL   Triglycerides 272.5 0.0 - 149.0 mg/dL   HDL 36.64 >40.34 mg/dL   VLDL 74.2 0.0 - 59.5 mg/dL   LDL Cholesterol 638 (H) 0 - 99 mg/dL   Total CHOL/HDL Ratio 4    NonHDL 163.03   CBC with Differential/Platelet  Result Value Ref Range   WBC 5.4 4.0 - 10.5 K/uL   RBC 5.26 (H) 3.87 - 5.11 Mil/uL   Hemoglobin 15.9 (H) 12.0 - 15.0 g/dL   HCT 75.6 (H) 43.3 - 29.5 %   MCV 89.6 78.0 - 100.0 fl   MCHC 33.7 30.0 - 36.0 g/dL   RDW 18.8 41.6 - 60.6 %   Platelets 201.0 150.0 - 400.0 K/uL   Neutrophils Relative % 51.2 43.0 - 77.0 %   Lymphocytes Relative 39.5 12.0 - 46.0 %   Monocytes Relative 6.7 3.0 - 12.0 %   Eosinophils Relative 2.0 0.0 - 5.0 %   Basophils Relative 0.6 0.0 - 3.0 %   Neutro Abs 2.8 1.4 - 7.7 K/uL   Lymphs Abs 2.1 0.7 - 4.0 K/uL   Monocytes Absolute 0.4 0.1 - 1.0 K/uL   Eosinophils Absolute 0.1 0.0 - 0.7 K/uL   Basophils Absolute 0.0 0.0 - 0.1 K/uL  Basic metabolic panel  Result Value Ref Range   Sodium 140 135 - 145 mEq/L   Potassium 3.9 3.5 - 5.1 mEq/L   Chloride 103 96 - 112 mEq/L   CO2 28 19 - 32 mEq/L   Glucose, Bld 111 (H) 70 - 99 mg/dL   BUN 12 6 - 23 mg/dL   Creatinine, Ser 3.01 0.40 - 1.20 mg/dL   GFR 601.09 >32.35 mL/min   Calcium 10.0 8.4 - 10.5 mg/dL  Apolipoprotein B  Result Value Ref Range   Apolipoprotein B 130 (H) <90 mg/dL    Assessment & Plan:   Problem List Items Addressed This Visit     Essential hypertension - Primary    Chronic, improving control, with some dizziness and diastolic orthostasis on testing today however without orthostatic dizziness symptoms. Encouraged good hydration status. Will drop amlodipine to 2.5mg  daily.       Relevant Medications   amLODipine (NORVASC) 2.5 MG tablet   rosuvastatin (CRESTOR) 20 MG tablet   Hyperlipidemia    Reviewed elevated Lp(a) levels on recent testing as well as implications for  increased cardiovascular risk. For this reason do recommend starting statin medication - will start rosuvastatin 20mg  nightly.  Reviewed mechanism of action as well as possible side effects to monitor for. Reviewed need to avoid pregnancy. She states she is menopausal as last regular period was 2020.  The 10-year ASCVD risk score (Arnett DK, et al., 2019) is: 3.2%   Values used to calculate the score:     Age: 69 years     Sex: Female     Is Non-Hispanic African American: No     Diabetic: Yes     Tobacco smoker: No     Systolic Blood Pressure: 756 mmHg     Is BP treated: Yes     HDL Cholesterol: 52.3 mg/dL     Total Cholesterol: 215 mg/dL       Relevant Medications   amLODipine (NORVASC) 2.5 MG tablet   rosuvastatin (CRESTOR) 20 MG tablet   Family history of stroke (cerebrovascular)     Meds ordered this encounter  Medications   amLODipine (NORVASC) 2.5 MG tablet    Sig: Take 1 tablet (2.5 mg total) by mouth daily.    Dispense:  90 tablet    Refill:  3    Note new dose   rosuvastatin (CRESTOR) 20 MG tablet    Sig: Take 1 tablet (20 mg total) by mouth daily.    Dispense:  30 tablet    Refill:  11    No orders of the defined types were placed in this encounter.   Patient Instructions  Blood pressures are doing better - let's drop amlodipine dose to 2.5mg  daily.  Lipoprotein a levels were very high - this puts you at increased cardiovascular risk. Start cholesterol medicine rosuvastatin (Crestor) 20mg  daily - take at night time if able. 1 month supply sent to local pharmacy.  Let me know if any trouble tolerating medicine.  Good to see you today  Return after June 7th for physical   Follow up plan: Return in about 5 months (around 09/21/2022) for annual exam, prior fasting for blood work.  Ria Bush, MD

## 2022-04-22 NOTE — Assessment & Plan Note (Signed)
Reviewed elevated Lp(a) levels on recent testing as well as implications for increased cardiovascular risk. For this reason do recommend starting statin medication - will start rosuvastatin 20mg  nightly. Reviewed mechanism of action as well as possible side effects to monitor for. Reviewed need to avoid pregnancy. She states she is menopausal as last regular period was 2020.  The 10-year ASCVD risk score (Arnett DK, et al., 2019) is: 3.2%   Values used to calculate the score:     Age: 52 years     Sex: Female     Is Non-Hispanic African American: No     Diabetic: Yes     Tobacco smoker: No     Systolic Blood Pressure: 264 mmHg     Is BP treated: Yes     HDL Cholesterol: 52.3 mg/dL     Total Cholesterol: 215 mg/dL

## 2022-04-22 NOTE — Patient Instructions (Addendum)
Blood pressures are doing better - let's drop amlodipine dose to 2.5mg  daily.  Lipoprotein a levels were very high - this puts you at increased cardiovascular risk. Start cholesterol medicine rosuvastatin (Crestor) 20mg  daily - take at night time if able. 1 month supply sent to local pharmacy.  Let me know if any trouble tolerating medicine.  Good to see you today  Return after June 7th for physical

## 2022-05-13 ENCOUNTER — Telehealth: Payer: Self-pay | Admitting: Family Medicine

## 2022-05-13 DIAGNOSIS — E7841 Elevated Lipoprotein(a): Secondary | ICD-10-CM

## 2022-05-13 MED ORDER — ROSUVASTATIN CALCIUM 20 MG PO TABS: 20.0000 mg | ORAL_TABLET | Freq: Every day | ORAL | 3 refills | Status: DC

## 2022-05-13 NOTE — Telephone Encounter (Signed)
Noted! Thank you

## 2022-05-13 NOTE — Telephone Encounter (Signed)
E-scribed 90-day rx to mail order pharmacy. Fyi to Dr. Darnell Level.

## 2022-05-13 NOTE — Telephone Encounter (Signed)
Patient called about rosuvastatin (CRESTOR) 20 MG tablet and said there are no side effects and asked if it can be sent in for 90 days [place]

## 2022-08-30 ENCOUNTER — Telehealth: Payer: Self-pay | Admitting: Family Medicine

## 2022-08-30 DIAGNOSIS — E7841 Elevated Lipoprotein(a): Secondary | ICD-10-CM

## 2022-08-30 MED ORDER — ROSUVASTATIN CALCIUM 20 MG PO TABS: 20.0000 mg | ORAL_TABLET | Freq: Every day | ORAL | 0 refills | Status: DC

## 2022-08-30 NOTE — Telephone Encounter (Signed)
Prescription Request  08/30/2022  LOV: 04/22/2022  What is the name of the medication or equipment? rosuvastatin (CRESTOR) 20 MG tablet   Have you contacted your pharmacy to request a refill? No   Which pharmacy would you like this sent to?   [facility] ([facility] - [place], [place] - [address] [place] [address] Phone: [phone] Fax: [phone]   Patient notified that their request is being sent to the clinical staff for review and that they should receive a response within 2 business days.   Please advise at Mobile [phone] (mobile)

## 2022-08-30 NOTE — Telephone Encounter (Signed)
E-scribed refill to [facility] order pharmacy.

## 2022-09-01 ENCOUNTER — Other Ambulatory Visit: Payer: Self-pay | Admitting: Family Medicine

## 2022-09-01 DIAGNOSIS — Z1159 Encounter for screening for other viral diseases: Secondary | ICD-10-CM

## 2022-09-01 DIAGNOSIS — Z823 Family history of stroke: Secondary | ICD-10-CM

## 2022-09-01 DIAGNOSIS — R7303 Prediabetes: Secondary | ICD-10-CM

## 2022-09-01 DIAGNOSIS — E039 Hypothyroidism, unspecified: Secondary | ICD-10-CM

## 2022-09-01 DIAGNOSIS — E7841 Elevated Lipoprotein(a): Secondary | ICD-10-CM

## 2022-09-03 ENCOUNTER — Other Ambulatory Visit (INDEPENDENT_AMBULATORY_CARE_PROVIDER_SITE_OTHER): Payer: PRIVATE HEALTH INSURANCE

## 2022-09-03 ENCOUNTER — Other Ambulatory Visit: Payer: Self-pay | Admitting: Family Medicine

## 2022-09-03 DIAGNOSIS — E7841 Elevated Lipoprotein(a): Secondary | ICD-10-CM

## 2022-09-03 DIAGNOSIS — E039 Hypothyroidism, unspecified: Secondary | ICD-10-CM | POA: Diagnosis not present

## 2022-09-03 DIAGNOSIS — Z1211 Encounter for screening for malignant neoplasm of colon: Secondary | ICD-10-CM | POA: Diagnosis not present

## 2022-09-03 DIAGNOSIS — R7303 Prediabetes: Secondary | ICD-10-CM | POA: Diagnosis not present

## 2022-09-03 DIAGNOSIS — Z1159 Encounter for screening for other viral diseases: Secondary | ICD-10-CM | POA: Diagnosis not present

## 2022-09-03 DIAGNOSIS — Z1231 Encounter for screening mammogram for malignant neoplasm of breast: Secondary | ICD-10-CM

## 2022-09-03 LAB — LIPID PANEL
Cholesterol: 122 mg/dL (ref 0–200)
HDL: 44.4 mg/dL (ref >39.00)
LDL Cholesterol: 57 mg/dL (ref 0–99)
NonHDL: 77.55
Total CHOL/HDL Ratio: 3
Triglycerides: 105 mg/dL (ref 0.0–149.0)
VLDL: 21 mg/dL (ref 0.0–40.0)

## 2022-09-03 LAB — COMPREHENSIVE METABOLIC PANEL
ALT: 33 U/L (ref 0–35)
AST: 23 U/L (ref 0–37)
Albumin: 4.7 g/dL (ref 3.5–5.2)
Alkaline Phosphatase: 63 U/L (ref 39–117)
BUN: 14 mg/dL (ref 6–23)
CO2: 29 mEq/L (ref 19–32)
Calcium: 9.6 mg/dL (ref 8.4–10.5)
Chloride: 103 mEq/L (ref 96–112)
Creatinine, Ser: 0.67 mg/dL (ref 0.40–1.20)
GFR: 101.29 mL/min (ref >60.00)
Glucose, Bld: 132 mg/dL — ABNORMAL HIGH (ref 70–99)
Potassium: 4.2 mEq/L (ref 3.5–5.1)
Sodium: 139 mEq/L (ref 135–145)
Total Bilirubin: 2 mg/dL — ABNORMAL HIGH (ref 0.2–1.2)
Total Protein: 7.7 g/dL (ref 6.0–8.3)

## 2022-09-03 LAB — HEMOGLOBIN A1C: Hgb A1c MFr Bld: 7.5 % — ABNORMAL HIGH (ref 4.6–6.5)

## 2022-09-04 LAB — HEPATITIS C ANTIBODY: Hepatitis C Ab: NONREACTIVE

## 2022-09-04 LAB — TSH: TSH: 2.04 u[IU]/mL (ref 0.35–5.50)

## 2022-09-04 LAB — T4, FREE: Free T4: 1.04 ng/dL (ref 0.60–1.60)

## 2022-09-10 ENCOUNTER — Encounter: Payer: Self-pay | Admitting: Family Medicine

## 2022-09-10 ENCOUNTER — Ambulatory Visit (INDEPENDENT_AMBULATORY_CARE_PROVIDER_SITE_OTHER): Payer: PRIVATE HEALTH INSURANCE | Admitting: Family Medicine

## 2022-09-10 VITALS — BP 124/78 | HR 70 | Temp 97.9°F | Ht 61.25 in | Wt 136.5 lb

## 2022-09-10 DIAGNOSIS — Z23 Encounter for immunization: Secondary | ICD-10-CM | POA: Diagnosis not present

## 2022-09-10 DIAGNOSIS — I10 Essential (primary) hypertension: Secondary | ICD-10-CM

## 2022-09-10 DIAGNOSIS — Z823 Family history of stroke: Secondary | ICD-10-CM | POA: Diagnosis not present

## 2022-09-10 DIAGNOSIS — E1169 Type 2 diabetes mellitus with other specified complication: Secondary | ICD-10-CM

## 2022-09-10 DIAGNOSIS — Z7184 Encounter for health counseling related to travel: Secondary | ICD-10-CM

## 2022-09-10 DIAGNOSIS — Z1211 Encounter for screening for malignant neoplasm of colon: Secondary | ICD-10-CM

## 2022-09-10 DIAGNOSIS — Z0000 Encounter for general adult medical examination without abnormal findings: Secondary | ICD-10-CM | POA: Diagnosis not present

## 2022-09-10 DIAGNOSIS — N951 Menopausal and female climacteric states: Secondary | ICD-10-CM

## 2022-09-10 DIAGNOSIS — E7841 Elevated Lipoprotein(a): Secondary | ICD-10-CM

## 2022-09-10 DIAGNOSIS — E039 Hypothyroidism, unspecified: Secondary | ICD-10-CM

## 2022-09-10 DIAGNOSIS — H60543 Acute eczematoid otitis externa, bilateral: Secondary | ICD-10-CM

## 2022-09-10 MED ORDER — B COMPLEX VITAMINS PO CAPS: 1.0000 | ORAL_CAPSULE | Freq: Every day | ORAL | Status: DC

## 2022-09-10 MED ORDER — VIVOTIF PO CPDR: 1.0000 | DELAYED_RELEASE_CAPSULE | ORAL | 0 refills | Status: DC

## 2022-09-10 MED ORDER — ROSUVASTATIN CALCIUM 10 MG PO TABS: 10.0000 mg | ORAL_TABLET | Freq: Every day | ORAL | 4 refills | Status: DC

## 2022-09-10 MED ORDER — TURMERIC 500 MG PO CAPS: 1.0000 | ORAL_CAPSULE | Freq: Three times a day (TID) | ORAL | Status: DC | PRN

## 2022-09-10 MED ORDER — HYDROCORTISONE-ACETIC ACID 1-2 % OT SOLN
4.0000 [drp] | Freq: Two times a day (BID) | OTIC | 0 refills | Status: DC
Start: 2022-09-10 — End: 2023-04-17

## 2022-09-10 MED ORDER — FLAX SEED OIL 1000 MG PO CAPS: 1.0000 | ORAL_CAPSULE | Freq: Every day | ORAL | Status: DC

## 2022-09-10 MED ORDER — MULTIVITAMIN ADULT PO TABS: 1.0000 | ORAL_TABLET | Freq: Every day | ORAL | Status: DC

## 2022-09-10 MED ORDER — AMLODIPINE BESYLATE 2.5 MG PO TABS: 2.5000 mg | ORAL_TABLET | Freq: Every day | ORAL | 4 refills | Status: DC

## 2022-09-10 MED ORDER — BIOTIN 10000 MCG PO TABS: 1.0000 | ORAL_TABLET | Freq: Every day | ORAL | Status: DC

## 2022-09-10 MED ORDER — ROSUVASTATIN CALCIUM 20 MG PO TABS: 20.0000 mg | ORAL_TABLET | Freq: Every day | ORAL | 4 refills | Status: DC

## 2022-09-10 NOTE — Progress Notes (Signed)
Ph: [phone] Fax: [phone]   Patient ID: Becky Shaw, female    DOB: 20-Jan-1971, 52 y.o.   MRN: [id number]  This visit was conducted in person.  BP 124/78   Pulse 70   Temp 97.9 F (36.6 C) (Temporal)   Ht 5' 1.25" (1.556 m)   Wt 136 lb 8 oz (61.9 kg)   SpO2 98%   BMI 25.58 kg/m    CC: CPE Subjective:   HPI: Becky Shaw is a 52 y.o. female presenting on 09/10/2022 for Annual Exam   Upcoming trip to [place] and [place] ([place] Aug 1st for 1 month.    HTN - stable period on amlodipine 2.5mg  daily.   Strong fmhx stroke in brother and father. Found to have high Lp(a) to 278. Started on rosuvastatin 20mg  daily, tolerating fine.   LMP ~ 06/2020. Notes hot flashes. She takes flax seed oil with benefit. Postmenopausal.   Preventative: Colon cancer screening - yearly iFOB Well woman exam - always normal, latest normal pap 06/2020 - discussed q3-5 yr    Mammogram 09/2021 - returned for diagnostic mammo/US bilateral - Birads2, rpt 1 yr Lung cancer screening - not eligible  DEXA scan - not due Flu shot - declined COVID shot - Moderna x3 Td 2011, rpt Tdap today Pneumonia shot - not due Shingrix - discussed Advanced directive discussion -  Seat belt use discussed Sunscreen use discussed. No changing moles on skin. 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 Interim medical history since our last visit reviewed. Allergies and medications reviewed and updated. Outpatient Medications Prior to Visit  Medication  Sig Dispense Refill   acetic acid-hydrocortisone (VOSOL-HC) OTIC solution Place 4 drops into both ears 2 (two) times daily. (Patient taking differently: Place 4 drops into both ears as needed (itching).) 10 mL 0   amLODipine (NORVASC) 2.5 MG tablet Take 1 tablet (2.5 mg total) by mouth daily. 90 tablet 3   rosuvastatin (CRESTOR) 20 MG tablet Take 1 tablet (20 mg total) by mouth daily. 90 tablet 0   No facility-administered medications prior to visit.     Per HPI unless specifically indicated in ROS section below Review of Systems  Constitutional:  Negative for activity change, appetite change, chills, fatigue, fever and unexpected weight change.  HENT:  Negative for hearing loss.   Eyes:  Negative for visual disturbance.  Respiratory:  Negative for cough, chest tightness, shortness of breath and wheezing.   Cardiovascular:  Negative for chest pain, palpitations and leg swelling.  Gastrointestinal:  Negative for abdominal distention, abdominal pain, blood in stool, constipation, diarrhea, nausea and vomiting.  Genitourinary:  Negative for difficulty urinating and hematuria.  Musculoskeletal:  Negative for arthralgias, myalgias and neck pain.  Skin:  Negative for rash.  Neurological:  Negative for dizziness, seizures, syncope and headaches.  Hematological:  Negative for adenopathy. Does not bruise/bleed easily.  Psychiatric/Behavioral:  Negative for dysphoric mood. The patient is not nervous/anxious.     Objective:  BP 124/78   Pulse 70   Temp  97.9 F (36.6 C) (Temporal)   Ht 5' 1.25" (1.556 m)   Wt 136 lb 8 oz (61.9 kg)   SpO2 98%   BMI 25.58 kg/m   Wt Readings from Last 3 Encounters:  09/10/22 136 lb 8 oz (61.9 kg)  04/22/22 132 lb 8 oz (60.1 kg)  03/08/22 131 lb (59.4 kg)      Physical Exam Vitals and nursing note reviewed.  Constitutional:      Appearance: Normal appearance. She is not ill-appearing.  HENT:     Head: Normocephalic and atraumatic.     Right Ear:  Tympanic membrane, ear canal and external ear normal. There is no impacted cerumen.     Left Ear: Tympanic membrane, ear canal and external ear normal. There is no impacted cerumen.  Eyes:     General:        Right eye: No discharge.        Left eye: No discharge.     Extraocular Movements: Extraocular movements intact.     Conjunctiva/sclera: Conjunctivae normal.     Pupils: Pupils are equal, round, and reactive to light.  Neck:     Thyroid: No thyroid mass or thyromegaly.  Cardiovascular:     Rate and Rhythm: Normal rate and regular rhythm.     Pulses: Normal pulses.     Heart sounds: Normal heart sounds. No murmur heard. Pulmonary:     Effort: Pulmonary effort is normal. No respiratory distress.     Breath sounds: Normal breath sounds. No wheezing, rhonchi or rales.  Abdominal:     General: Bowel sounds are normal. There is no distension.     Palpations: Abdomen is soft. There is no mass.     Tenderness: There is no abdominal tenderness. There is no guarding or rebound.     Hernia: No hernia is present.  Musculoskeletal:     Cervical back: Normal range of motion and neck supple. No rigidity.     Right lower leg: No edema.     Left lower leg: No edema.  Lymphadenopathy:     Cervical: No cervical adenopathy.  Skin:    General: Skin is warm and dry.     Findings: No rash.  Neurological:     General: No focal deficit present.     Mental Status: She is alert. Mental status is at baseline.  Psychiatric:        Mood and Affect: Mood normal.        Behavior: Behavior normal.       Results for orders placed or performed in visit on 09/03/22  Fecal occult blood, imunochemical([facility])   Specimen: Stool  Result Value Ref Range   Fecal Occult Bld Negative Negative  Hepatitis C antibody  Result Value Ref Range   Hepatitis C Ab NON-REACTIVE NON-REACTIVE  T4, free  Result Value Ref Range   Free T4 1.04 0.60 - 1.60 ng/dL  TSH  Result Value Ref Range   TSH 2.04 0.35 -  5.50 uIU/mL  Hemoglobin A1c  Result Value Ref Range   Hgb A1c MFr Bld 7.5 (H) 4.6 - 6.5 %  Comprehensive metabolic panel  Result Value Ref Range   Sodium 139 135 - 145 mEq/L   Potassium 4.2 3.5 - 5.1 mEq/L   Chloride 103 96 - 112 mEq/L   CO2 29 19 - 32 mEq/L   Glucose, Bld 132 (H) 70 - 99 mg/dL   BUN 14 6 - 23 mg/dL   Creatinine, Ser 1.61 0.40 -  1.20 mg/dL   Total Bilirubin 2.0 (H) 0.2 - 1.2 mg/dL   Alkaline Phosphatase 63 39 - 117 U/L   AST 23 0 - 37 U/L   ALT 33 0 - 35 U/L   Total Protein 7.7 6.0 - 8.3 g/dL   Albumin 4.7 3.5 - 5.2 g/dL   GFR 161.09 >60.45 mL/min   Calcium 9.6 8.4 - 10.5 mg/dL  Lipid panel  Result Value Ref Range   Cholesterol 122 0 - 200 mg/dL   Triglycerides 409.8 0.0 - 149.0 mg/dL   HDL 11.91 >47.82 mg/dL   VLDL 95.6 0.0 - 21.3 mg/dL   LDL Cholesterol 57 0 - 99 mg/dL   Total CHOL/HDL Ratio 3    NonHDL 77.55     Assessment & Plan:   Problem List Items Addressed This Visit     Health maintenance examination - Primary (Chronic)    Preventative protocols reviewed and updated unless pt declined. Discussed healthy diet and lifestyle.       Essential hypertension    Chronic, stable on current regimen of low dose amlodipine - continue      Relevant Medications   amLODipine (NORVASC) 2.5 MG tablet   rosuvastatin (CRESTOR) 10 MG tablet   Hyperlipidemia    Lp(a) high. Chronic, stable period on Crestor 20mg  daily. Noted increase in sugars with elevated A1c - will drop Crestor to 10mg  nightly.  The ASCVD Risk score (Arnett DK, et al., 2019) failed to calculate for the following reasons:   The valid total cholesterol range is 130 to 320 mg/dL       Relevant Medications   amLODipine (NORVASC) 2.5 MG tablet   rosuvastatin (CRESTOR) 10 MG tablet   Dermatitis of both ear canals   Relevant Medications   acetic acid-hydrocortisone (VOSOL-HC) OTIC solution   Type 2 diabetes mellitus with other specified complication (HCC)    Chronic, deteriorated control  in setting of starting statin.  Will drop statin dose, RTC 3 mo DM f/u visit and reassess at that time.  Encouraged she follow diabetic diet.       Relevant Medications   rosuvastatin (CRESTOR) 10 MG tablet   Family history of stroke (cerebrovascular)    Continue statin.       Borderline hypothyroidism    TFTs remain stable.       Travel advice encounter    Upcoming trip for 1 month to [place] and [place]. Reviewed recommended immunizations/vaccines - update Tdap, Rx oral vivotif typhoid vaccine with instructions on administration.  No need for malaria prophylaxis but reviewed avoidance of mosquito bites.       Menopausal state    LMP 06/2020      Other Visit Diagnoses     Need for Tdap vaccination       Relevant Orders   Tdap vaccine greater than or equal to 7yo IM (Completed)   Special screening for malignant neoplasms, colon       Relevant Orders   Fecal occult blood, imunochemical        Meds ordered this encounter  Medications   acetic acid-hydrocortisone (VOSOL-HC) OTIC solution    Sig: Place 4 drops into both ears 2 (two) times daily.    Dispense:  10 mL    Refill:  0   amLODipine (NORVASC) 2.5 MG tablet    Sig: Take 1 tablet (2.5 mg total) by mouth daily.    Dispense:  90 tablet    Refill:  4   DISCONTD: rosuvastatin (CRESTOR) 20 MG  tablet    Sig: Take 1 tablet (20 mg total) by mouth daily.    Dispense:  90 tablet    Refill:  4   Flaxseed, Linseed, (FLAX SEED OIL) 1000 MG CAPS    Sig: Take 1 capsule (1,000 mg total) by mouth daily. With omega 3 fatty acids   typhoid (VIVOTIF) DR capsule    Sig: Take 1 capsule by mouth every other day. Start at least 2 weeks before travel    Dispense:  4 capsule    Refill:  0   Turmeric 500 MG CAPS    Sig: Take 1 Capful by mouth 3 (three) times daily as needed.   Biotin 16109 MCG TABS    Sig: Take 1 tablet by mouth daily.    Dispense:  30 tablet   b complex vitamins capsule    Sig: Take 1 capsule by mouth daily.    Multiple Vitamin (MULTIVITAMIN ADULT) TABS    Sig: Take 1 tablet by mouth daily.   rosuvastatin (CRESTOR) 10 MG tablet    Sig: Take 1 tablet (10 mg total) by mouth daily.    Dispense:  90 tablet    Refill:  4    Use this dose    Orders Placed This Encounter  Procedures   Fecal occult blood, imunochemical    Standing Status:   Future    Standing Expiration Date:   09/16/2023   Tdap vaccine greater than or equal to 7yo IM    Patient Instructions  Drop crestor dose to 10mg  nightly - new dose sent to pharmacy.  Sugars were in diabetes range - work on low sugar low carb diabetic diet, returrn in 3 months to recheck sugar levels.  Pass by lab to pick up stool kit.  Tdap today.  May take oral typhoid vaccine sent to pharmacy.  Consider shingles shots.  Call to schedule mammogram.  Return in 3 months for diabetes follow up   Follow up plan: Return in about 3 months (around 12/11/2022) for follow up visit.  Eustaquio Boyden, MD

## 2022-09-10 NOTE — Patient Instructions (Addendum)
Drop crestor dose to 10mg  nightly - new dose sent to pharmacy.  Sugars were in diabetes range - work on low sugar low carb diabetic diet, returrn in 3 months to recheck sugar levels.  Pass by lab to pick up stool kit.  Tdap today.  May take oral typhoid vaccine sent to pharmacy.  Consider shingles shots.  Call to schedule mammogram.  Return in 3 months for diabetes follow up

## 2022-09-10 NOTE — Assessment & Plan Note (Signed)
Preventative protocols reviewed and updated unless pt declined. Discussed healthy diet and lifestyle.  

## 2022-09-12 LAB — FECAL OCCULT BLOOD, IMMUNOCHEMICAL: Fecal Occult Bld: NEGATIVE

## 2022-09-12 NOTE — Addendum Note (Signed)
Addended by: Damita Lack on: 09/12/2022 10:17 AM   Modules accepted: Orders

## 2022-09-16 ENCOUNTER — Encounter: Payer: Self-pay | Admitting: Family Medicine

## 2022-09-16 DIAGNOSIS — Z7184 Encounter for health counseling related to travel: Secondary | ICD-10-CM | POA: Insufficient documentation

## 2022-09-16 DIAGNOSIS — N951 Menopausal and female climacteric states: Secondary | ICD-10-CM | POA: Insufficient documentation

## 2022-09-16 NOTE — Assessment & Plan Note (Signed)
Chronic, deteriorated control in setting of starting statin.  Will drop statin dose, RTC 3 mo DM f/u visit and reassess at that time.  Encouraged she follow diabetic diet.

## 2022-09-16 NOTE — Assessment & Plan Note (Addendum)
Chronic, stable on current regimen of low dose amlodipine- continue 

## 2022-09-16 NOTE — Assessment & Plan Note (Signed)
TFTs remain stable. 

## 2022-09-16 NOTE — Assessment & Plan Note (Signed)
Continue statin. 

## 2022-09-16 NOTE — Assessment & Plan Note (Signed)
Lp(a) high. Chronic, stable period on Crestor 20mg  daily. Noted increase in sugars with elevated A1c - will drop Crestor to 10mg  nightly.  The ASCVD Risk score (Arnett DK, et al., 2019) failed to calculate for the following reasons:   The valid total cholesterol range is 130 to 320 mg/dL

## 2022-09-16 NOTE — Assessment & Plan Note (Addendum)
Upcoming trip for 1 month to [place] and [place]. Reviewed recommended immunizations/vaccines - update Tdap, Rx oral vivotif typhoid vaccine with instructions on administration.  No need for malaria prophylaxis but reviewed avoidance of mosquito bites.

## 2022-09-16 NOTE — Assessment & Plan Note (Signed)
LMP 06/2020

## 2022-09-30 ENCOUNTER — Other Ambulatory Visit: Payer: 59

## 2022-09-30 ENCOUNTER — Telehealth: Payer: Self-pay | Admitting: Family Medicine

## 2022-09-30 NOTE — Telephone Encounter (Signed)
Spoke with pt relaying Dr. Timoteo Expose message. Pt verbalizes understanding and states she had been hiking also and that may have been cause of joint pain. Says she has only taken 2 days of 10 mg, then stopped. States she will take 10 mg daily for 1 wk to see if she has any issues and will let Dr. Reece Agar know.

## 2022-09-30 NOTE — Telephone Encounter (Addendum)
We actually dropped Crestor dose from 20mg  to 10mg  so it would be strange for increase in pain to come from lower crestor dose.  Is she sure no other changes have occurred in interim?  If so, she could try taking every other day to see if better tolerated.

## 2022-09-30 NOTE — Telephone Encounter (Signed)
Patient called in and stated that at her last visit she and Dr. Reece Agar discussed her joint pain. She stated that since the increase in rosuvastatin (CRESTOR) 10 MG tablet she has been experiencing more pain and think its due to the medication. She was wondering if there is something else she can take. Please advise. Thank you!

## 2022-10-07 ENCOUNTER — Encounter: Payer: 59 | Admitting: Family Medicine

## 2022-10-07 NOTE — Telephone Encounter (Signed)
May drop Crestor to once or twice weekly to see if symptoms improve then slowly titrate back to every other day if tolerated. Update [place] with effect.

## 2022-10-07 NOTE — Telephone Encounter (Signed)
Called patient reviewed all information and repeated back to me. Will call if any questions. She will start taking 1-2 times a week and see if able to titrate up from that.

## 2022-10-07 NOTE — Telephone Encounter (Signed)
Patient called in today stating that she still has pain in her knees and her hips,and would like to know if she should discontinue the medication?

## 2023-02-10 ENCOUNTER — Ambulatory Visit: Payer: 59 | Admitting: Family Medicine

## 2023-02-10 ENCOUNTER — Encounter: Payer: Self-pay | Admitting: Family Medicine

## 2023-02-10 VITALS — BP 124/82 | HR 82 | Temp 98.3°F | Ht 61.25 in | Wt 139.0 lb

## 2023-02-10 DIAGNOSIS — E1169 Type 2 diabetes mellitus with other specified complication: Secondary | ICD-10-CM | POA: Diagnosis not present

## 2023-02-10 DIAGNOSIS — E7841 Elevated Lipoprotein(a): Secondary | ICD-10-CM

## 2023-02-10 LAB — POCT GLYCOSYLATED HEMOGLOBIN (HGB A1C): Hemoglobin A1C: 7.1 % — AB (ref 4.0–5.6)

## 2023-02-10 MED ORDER — ATORVASTATIN CALCIUM 10 MG PO TABS: 10.0000 mg | ORAL_TABLET | ORAL | 3 refills | Status: DC

## 2023-02-10 NOTE — Assessment & Plan Note (Signed)
Chronic, with elevated Lp(a). Crestor intolerance.  Reviewed indication for statin in diabetic and with high Lp(a) levels. Father tolerates atorvastatin 40mg  well. Will start atorvastatin 10mg  weekly with planned slow titration as tolerated. Update with effect and tolerance.  The ASCVD Risk score (Arnett DK, et al., 2019) failed to calculate for the following reasons:   The valid total cholesterol range is 130 to 320 mg/dL

## 2023-02-10 NOTE — Patient Instructions (Addendum)
Try out atorvastatin 10mg  once weekly. If doing well with this, may increase to twice weekly. Continue titrating as tolerated Schedule eye doctor appointment - ask for diabetic eye exam  Foot exam today.  Continue monitoring sugars, let us know if consistently >130 in the morning.  Continue low sugar low carb diabetic diet.  Return in 7 months for physical

## 2023-02-10 NOTE — Progress Notes (Signed)
Ph: [phone] Fax: [phone]   Patient ID: Becky Shaw, female    DOB: Jan 05, 1971, 52 y.o.   MRN: [id number]  This visit was conducted in person.  BP 124/82   Pulse 82   Temp 98.3 F (36.8 C) (Oral)   Ht 5' 1.25" (1.556 m)   Wt 139 lb (63 kg)   SpO2 98%   BMI 26.05 kg/m    CC: 3 mo DM f/u visit  Subjective:   HPI: Becky Shaw is a 52 y.o. female presenting on 02/10/2023 for Medical Management of Chronic Issues (Here for 3 mo DM f/u.)   Recent trip to mountains of [place].   HLD with high Lp(a) 278 - on Crestor 10mg  - daily dosing may have worsened arthralgias so we tried 1-2x/wk. She fully stopped Crestor with resolution of knee pain. Her father takes atorvastatin and tolerates this well.   DM - does regularly check sugars - fasting 124. Compliant with antihyperglycemic regimen which includes: diet controlled - she follows low sugar diet, aboid sweetened beverages. Denies low sugars or hypoglycemic symptoms. Denies paresthesias, blurry vision. Last diabetic eye exam DUE. Glucometer brand: accu-chek. Last foot exam: DUE. DSME: not done. Father with diabetes.  Lab Results  Component Value Date   HGBA1C 7.1 (A) 02/10/2023   Diabetic Foot Exam - Simple   Simple Foot Form Diabetic Foot exam was performed with the following findings: Yes 02/10/2023  9:46 AM  Visual Inspection No deformities, no ulcerations, no other skin breakdown bilaterally: Yes Sensation Testing Intact to touch and monofilament testing bilaterally: Yes Pulse Check Posterior Tibialis and Dorsalis pulse intact bilaterally: Yes Comments No claudication    Lab Results  Component Value Date   MICROALBUR 2.0 (H) 12/07/2021       Relevant past medical, surgical, family and social history reviewed and updated as indicated. Interim medical history since our last visit reviewed. Allergies and medications reviewed and updated. Outpatient Medications Prior to Visit  Medication Sig  Dispense Refill   acetic acid-hydrocortisone (VOSOL-HC) OTIC solution Place 4 drops into both ears 2 (two) times daily. 10 mL 0   amLODipine (NORVASC) 2.5 MG tablet Take 1 tablet (2.5 mg total) by mouth daily. 90 tablet 4   b complex vitamins capsule Take 1 capsule by mouth daily.     Biotin 63875 MCG TABS Take 1 tablet by mouth daily. 30 tablet    Flaxseed, Linseed, (FLAX SEED OIL) 1000 MG CAPS Take 1 capsule (1,000 mg total) by mouth daily. With omega 3 fatty acids     Multiple Vitamin (MULTIVITAMIN ADULT) TABS Take 1 tablet by mouth daily.     Turmeric 500 MG CAPS Take 1 Capful by mouth 3 (three) times daily as needed.     typhoid (VIVOTIF) DR capsule Take 1 capsule by mouth every other day. Start at least 2 weeks before travel 4 capsule 0   rosuvastatin (CRESTOR) 10 MG tablet Take 1 tablet (10 mg total) by mouth daily. (Patient not taking: Reported on 02/10/2023) 90 tablet 4   No facility-administered medications prior to visit.     Per HPI unless specifically indicated in ROS section below Review of Systems  Objective:  BP 124/82   Pulse 82   Temp 98.3 F (36.8 C) (Oral)   Ht 5' 1.25" (1.556 m)   Wt 139 lb (63 kg)   SpO2 98%   BMI 26.05 kg/m   Wt Readings from Last 3 Encounters:  02/10/23 139 lb (63 kg)  09/10/22 136 lb 8 oz (61.9 kg)  04/22/22 132 lb 8 oz (60.1 kg)      Physical Exam Vitals and nursing note reviewed.  Constitutional:      Appearance: Normal appearance. She is not ill-appearing.  HENT:     Head: Normocephalic and atraumatic.     Mouth/Throat:     Mouth: Mucous membranes are moist.     Pharynx: Oropharynx is clear. No oropharyngeal exudate or posterior oropharyngeal erythema.  Eyes:     Extraocular Movements: Extraocular movements intact.     Conjunctiva/sclera: Conjunctivae normal.     Pupils: Pupils are equal, round, and reactive to light.  Neck:     Thyroid: No thyroid mass or thyromegaly.  Cardiovascular:     Rate and Rhythm: Normal rate and  regular rhythm.     Pulses: Normal pulses.     Heart sounds: Normal heart sounds. No murmur heard. Pulmonary:     Effort: Pulmonary effort is normal. No respiratory distress.     Breath sounds: Normal breath sounds. No wheezing, rhonchi or rales.  Musculoskeletal:     Right lower leg: No edema.     Left lower leg: No edema.  Skin:    General: Skin is warm and dry.     Findings: No rash.  Neurological:     Mental Status: She is alert.  Psychiatric:        Mood and Affect: Mood normal.        Behavior: Behavior normal.       Results for orders placed or performed in visit on 02/10/23  POCT glycosylated hemoglobin (Hb A1C)  Result Value Ref Range   Hemoglobin A1C 7.1 (A) 4.0 - 5.6 %   HbA1c POC (<> result, manual entry)     HbA1c, POC (prediabetic range)     HbA1c, POC (controlled diabetic range)     Lab Results  Component Value Date   CHOL 122 09/03/2022   HDL 44.40 09/03/2022   LDLCALC 57 09/03/2022   TRIG 105.0 09/03/2022   CHOLHDL 3 09/03/2022   Assessment & Plan:   Problem List Items Addressed This Visit     Hyperlipidemia    Chronic, with elevated Lp(a). Crestor intolerance.  Reviewed indication for statin in diabetic and with high Lp(a) levels. Father tolerates atorvastatin 40mg  well. Will start atorvastatin 10mg  weekly with planned slow titration as tolerated. Update with effect and tolerance.  The ASCVD Risk score (Arnett DK, et al., 2019) failed to calculate for the following reasons:   The valid total cholesterol range is 130 to 320 mg/dL       Relevant Medications   atorvastatin (LIPITOR) 10 MG tablet   Type 2 diabetes mellitus with other specified complication (HCC) - Primary    Chronic, improving but discussed goal A1c <7%. Encouraged to continue following diabetic diet, regular exercise to keep good glycemic control.  Reviewed diabetes care checklist, handout provided. Encouraged she schedule diabetic eye exam as due for eye exam this year.        Relevant Medications   atorvastatin (LIPITOR) 10 MG tablet   Other Relevant Orders   POCT glycosylated hemoglobin (Hb A1C) (Completed)     Meds ordered this encounter  Medications   atorvastatin (LIPITOR) 10 MG tablet    Sig: Take 1 tablet (10 mg total) by mouth every Monday, Wednesday, and Friday.    Dispense:  30 tablet    Refill:  3    Orders Placed This Encounter  Procedures  POCT glycosylated hemoglobin (Hb A1C)    Patient Instructions  Try out atorvastatin 10mg  once weekly. If doing well with this, may increase to twice weekly. Continue titrating as tolerated Schedule eye doctor appointment - ask for diabetic eye exam  Foot exam today.  Continue monitoring sugars, let us know if consistently >130 in the morning.  Continue low sugar low carb diabetic diet.  Return in 7 months for physical   Follow up plan: Return in about 7 months (around 09/10/2023), or if symptoms worsen or fail to improve, for annual exam, prior fasting for blood work.  Eustaquio Boyden, MD

## 2023-02-10 NOTE — Assessment & Plan Note (Addendum)
Chronic, improving but discussed goal A1c <7%. Encouraged to continue following diabetic diet, regular exercise to keep good glycemic control.  Reviewed diabetes care checklist, handout provided. Encouraged she schedule diabetic eye exam as due for eye exam this year.

## 2023-03-06 LAB — HM DIABETES EYE EXAM

## 2023-04-14 ENCOUNTER — Encounter: Payer: Self-pay | Admitting: Internal Medicine

## 2023-04-14 ENCOUNTER — Other Ambulatory Visit: Payer: Self-pay

## 2023-04-14 ENCOUNTER — Ambulatory Visit (INDEPENDENT_AMBULATORY_CARE_PROVIDER_SITE_OTHER): Payer: 59 | Admitting: Internal Medicine

## 2023-04-14 ENCOUNTER — Ambulatory Visit: Payer: Self-pay | Admitting: Family Medicine

## 2023-04-14 ENCOUNTER — Emergency Department (HOSPITAL_COMMUNITY)
Admission: EM | Admit: 2023-04-14 | Discharge: 2023-04-14 | Disposition: A | Discharge status: Home / Self Care | Payer: 59 | Attending: Emergency Medicine | Admitting: Emergency Medicine

## 2023-04-14 ENCOUNTER — Encounter (HOSPITAL_COMMUNITY): Payer: Self-pay | Admitting: Emergency Medicine

## 2023-04-14 VITALS — BP 122/84 | HR 88 | Temp 98.4°F | Ht 61.25 in | Wt 139.0 lb

## 2023-04-14 DIAGNOSIS — R200 Anesthesia of skin: Secondary | ICD-10-CM

## 2023-04-14 DIAGNOSIS — R202 Paresthesia of skin: Secondary | ICD-10-CM | POA: Insufficient documentation

## 2023-04-14 LAB — CBC
HCT: 45.7 % (ref 36.0–46.0)
Hemoglobin: 15.5 g/dL — ABNORMAL HIGH (ref 12.0–15.0)
MCHC: 34 g/dL (ref 30.0–36.0)
MCV: 90.4 fL (ref 78.0–100.0)
Platelets: 210 10*3/uL (ref 150.0–400.0)
RBC: 5.05 Mil/uL (ref 3.87–5.11)
RDW: 12.5 % (ref 11.5–15.5)
WBC: 7.2 10*3/uL (ref 4.0–10.5)

## 2023-04-14 LAB — COMPREHENSIVE METABOLIC PANEL
ALT: 31 U/L (ref 0–35)
AST: 22 U/L (ref 0–37)
Albumin: 5 g/dL (ref 3.5–5.2)
Alkaline Phosphatase: 94 U/L (ref 39–117)
BUN: 13 mg/dL (ref 6–23)
CO2: 28 meq/L (ref 19–32)
Calcium: 10.1 mg/dL (ref 8.4–10.5)
Chloride: 102 meq/L (ref 96–112)
Creatinine, Ser: 0.58 mg/dL (ref 0.40–1.20)
GFR: 104.43 mL/min (ref >60.00)
Glucose, Bld: 161 mg/dL — ABNORMAL HIGH (ref 70–99)
Potassium: 3.6 meq/L (ref 3.5–5.1)
Sodium: 142 meq/L (ref 135–145)
Total Bilirubin: 1.4 mg/dL — ABNORMAL HIGH (ref 0.2–1.2)
Total Protein: 8.1 g/dL (ref 6.0–8.3)

## 2023-04-14 LAB — TSH: TSH: 2.54 u[IU]/mL (ref 0.35–5.50)

## 2023-04-14 LAB — VITAMIN B12: Vitamin B-12: 605 pg/mL (ref 211–911)

## 2023-04-14 NOTE — ED Triage Notes (Signed)
Pt reports intermittent numbness to bilateral feet, hands and lips/chin area x 1 week, was seen by PCP and UC who sent her for possible CT/MRI

## 2023-04-14 NOTE — Telephone Encounter (Signed)
Copied from CRM [id number]. Topic: Clinical - Red Word Triage >> Apr 14, 2023  8:41 AM Efraim Kaufmann C wrote: Red Word that prompted transfer to Nurse Triage: since Friday night patient has had tingling in toes and fingers and then this morning it has moved to her back. Now she feels cold  Chief Complaint: Back pain and chills Symptoms: Back pain, chills, recent numbness in fingers and toes Frequency: Since Friday Pertinent Negatives: Patient denies other neurological symptoms Disposition: [] ED /[] Urgent Care (no appt availability in office) / [x] Appointment(In office/virtual)/ []  [facility]/ [] Home Care/ [] Refused Recommended Disposition /[] [facility]/ []  Follow-up with PCP Additional Notes: Patient called in to report back pain and chills that started this morning. Patient stated that she started feeling tingling in her toes last Friday and tingling in her fingers on Saturday. Patient stated the tingling has gone away at this time. Patient stated that the sensation of water feels "different". Patient denied headache, dizziness, changes in vision, changes in balance, changes in speech, facial drooping, and numbness in other parts of the body. Patient denied chest pain and difficulty breathing. Advised patient to be seen in the office today. No availability with current PCP. Scheduled in office today with a different provider. Advised patient to call back if anything changes. Patient complied.   Reason for Disposition  Back pain (and neurologic deficit)  Answer Assessment - Initial Assessment Questions 1. SYMPTOM: "What is the main symptom you are concerned about?" (e.g., weakness, numbness)     States tingling in toes started Friday night, tingling in fingers started on Saturday, states back pain started this morning, chills started this morning, tingling has gone away, states the sensation of water is "different"  2. ONSET: "When did this start?" (minutes, hours, days; while  sleeping)     Friday  3. LAST NORMAL: "When was the last time you (the patient) were normal (no symptoms)?"     States she felt normal throughout the day on Friday  4. PATTERN "Does this come and go, or has it been constant since it started?"  "Is it present now?"     States tingling in fingers and toes has gone away  5. CARDIAC SYMPTOMS: "Have you had any of the following symptoms: chest pain, difficulty breathing, palpitations?"     Denies chest pain and difficulty breathing  6. NEUROLOGIC SYMPTOMS: "Have you had any of the following symptoms: headache, dizziness, vision loss, double vision, changes in speech, unsteady on your feet?"     Denies headache, denies dizziness, denies vision changes, states balance is normal, denies changes in speech, denies facial drooping, denies numbness in other parts of the body  7. OTHER SYMPTOMS: "Do you have any other symptoms?"     Back pain, chills, sensation of water is "different"  Protocols used: Neurologic Deficit-A-AH

## 2023-04-14 NOTE — Assessment & Plan Note (Signed)
Extremities and even mouth Cold sensation emanating from back is quite unusual No illness No new exposures Will check labs including B12 and TSH Advised to hold atorvastatin and all supplements

## 2023-04-14 NOTE — Patient Instructions (Signed)
Please stop taking the atorvastatin and all supplements for now--to see if that makes any difference with your symptoms. If everything seems back to normal, you can add back your medications one at a time

## 2023-04-14 NOTE — Progress Notes (Signed)
Subjective:    Patient ID: Becky Shaw, female    DOB: 06/03/1970, 53 y.o.   MRN: [id number]  HPI Here with husband due to numbness and tingling  Starting 3 nights ago--noticed tingling in toes Next morning--he felt it again when walking Now feels this in the tips of her fingers Yesterday AM--feels it on tongue and mouth Fingers and toes are not tingling--but still feels abnormal on hands (like when water runs on it) Mouth is worse  This morning noted some back pain---has sensation of "coldness" that spreads through body  No new medications or food Has diabetes--no meds for this.  Sugars have been good---AM mostly under 120  Current Outpatient Medications on File Prior to Visit  Medication Sig Dispense Refill   acetic acid-hydrocortisone (VOSOL-HC) OTIC solution Place 4 drops into both ears 2 (two) times daily. 10 mL 0   amLODipine (NORVASC) 2.5 MG tablet Take 1 tablet (2.5 mg total) by mouth daily. 90 tablet 4   atorvastatin (LIPITOR) 10 MG tablet Take 1 tablet (10 mg total) by mouth every Monday, Wednesday, and Friday. 30 tablet 3   b complex vitamins capsule Take 1 capsule by mouth daily.     Flaxseed, Linseed, (FLAX SEED OIL) 1000 MG CAPS Take 1 capsule (1,000 mg total) by mouth daily. With omega 3 fatty acids     Multiple Vitamin (MULTIVITAMIN ADULT) TABS Take 1 tablet by mouth daily.     Turmeric 500 MG CAPS Take 1 Capful by mouth 3 (three) times daily as needed.     No current facility-administered medications on file prior to visit.    Allergies  Allergen Reactions   Cetirizine Hcl     REACTION: insomnia    Past Medical History:  Diagnosis Date   ALLERGIC RHINITIS 05/17/2007   GANGLION CYST, WRIST, RIGHT 05/31/2008   Hyperlipidemia    OVERACTIVE BLADDER 05/15/2007   Prediabetes     Past Surgical History:  Procedure Laterality Date   CHOLECYSTECTOMY      Family History  Problem Relation Age of Onset   Stroke Father 17   Hypertension Father     Hypertension Sister    Hypertension Brother    Stroke Brother 48   Transient ischemic attack Brother 39   Stroke Brother 59       massive   Hypertension Brother    Hypertension Brother     Social History   Socioeconomic History   Marital status: Married    Spouse name: Thao   Number of children: 1   Years of education: associates   Highest education level: Not on file  Occupational History   Not on file  Tobacco Use   Smoking status: Never   Smokeless tobacco: Never  Substance and Sexual Activity   Alcohol use: Not Currently   Drug use: No   Sexual activity: Yes    Comment: perimenopausal  Other Topics Concern   Not on file  Social History Narrative   11/25/19   From: the area   Living: with husband, Thao (1988) and son   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, 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)      Family: son - Publishing rights manager (2005)      Enjoys: hiking, fishing      Exercise: walking 30 minutes a few times a week, hiking once a week   Diet: low sodium, veggies and less meat      Safety   Seat belts: Yes  Guns: Yes  and secure   Safe in relationships: Yes    Social Drivers of Corporate investment banker Strain: Not on file  Food Insecurity: Not on file  Transportation Needs: Not on file  Physical Activity: Not on file  Stress: Not on file  Social Connections: Not on file  Intimate Partner Violence: Not on file   Review of Systems No fever Doesn't feel sick Appetite is down since having this sensation Sleeps okay     Objective:   Physical Exam Constitutional:      Appearance: Normal appearance.  HENT:     Mouth/Throat:     Comments: No tongue, lip or mouth swelling Cardiovascular:     Rate and Rhythm: Normal rate and regular rhythm.     Heart sounds: No murmur heard.    No gallop.  Pulmonary:     Effort: Pulmonary effort is normal.     Breath sounds: Normal breath sounds. No wheezing or rales.   Musculoskeletal:     Cervical back: Neck supple.     Right lower leg: No edema.     Left lower leg: No edema.  Lymphadenopathy:     Cervical: No cervical adenopathy.  Neurological:     Mental Status: She is alert.            Assessment & Plan:

## 2023-04-14 NOTE — ED Provider Notes (Signed)
[place] EMERGENCY DEPARTMENT AT [facility] Provider Note   CSN: [id number] Arrival date & time: 04/14/23  2054     History  Chief Complaint  Patient presents with   Numbness    Becky Shaw is a 53 y.o. female.  HPI Patient is had a of paresthesias.  States she started with numbness/tingling on her toes.  States all the toes on both sides.  Went to fingers on her hands.  Also went to low back pain with some tingling in that area.  Now involving lips and the chin.  Saw PCP today who did basic blood work.  Blood work was reassuring.  Went to work today and reportedly had increasing numbness on her feet.  States that tingling moved up to above her ankles on both feet.  States she had difficulty moving her right foot.  States that was tingling more severely.  Now back down to her toes and the soles of her feet.  No headache.  She then went to an urgent care who told her she may have a nerve problem and may need that figured out more.    Home Medications Prior to Admission medications   Medication Sig Start Date End Date Taking? Authorizing Provider  acetic acid-hydrocortisone (VOSOL-HC) OTIC solution Place 4 drops into both ears 2 (two) times daily. 09/10/22   Eustaquio Boyden, MD  amLODipine (NORVASC) 2.5 MG tablet Take 1 tablet (2.5 mg total) by mouth daily. 09/10/22   Eustaquio Boyden, MD  atorvastatin (LIPITOR) 10 MG tablet Take 1 tablet (10 mg total) by mouth every Monday, Wednesday, and Friday. 02/10/23   Eustaquio Boyden, MD  b complex vitamins capsule Take 1 capsule by mouth daily. 09/10/22   Eustaquio Boyden, MD  Flaxseed, Linseed, (FLAX SEED OIL) 1000 MG CAPS Take 1 capsule (1,000 mg total) by mouth daily. With omega 3 fatty acids 09/10/22   Eustaquio Boyden, MD  Multiple Vitamin (MULTIVITAMIN ADULT) TABS Take 1 tablet by mouth daily. 09/10/22   Eustaquio Boyden, MD  Turmeric 500 MG CAPS Take 1 Capful by mouth 3 (three) times daily as needed. 09/10/22    Eustaquio Boyden, MD      Allergies    Cetirizine hcl    Review of Systems   Review of Systems  Physical Exam Updated Vital Signs BP 132/78 (BP Location: Right Arm)   Pulse 75   Temp (!) 97.5 F (36.4 C) (Oral)   Resp 20   Ht 5' 1.25" (1.556 m)   Wt 63 kg   SpO2 99%   BMI 26.05 kg/m  Physical Exam Vitals and nursing note reviewed.  Eyes:     Extraocular Movements: Extraocular movements intact.     Pupils: Pupils are equal, round, and reactive to light.  Cardiovascular:     Rate and Rhythm: Regular rhythm.  Abdominal:     Tenderness: There is no abdominal tenderness.  Musculoskeletal:     Cervical back: Neck supple.  Skin:    Capillary Refill: Capillary refill takes less than 2 seconds.  Neurological:     Mental Status: She is alert and oriented to person, place, and time.     Comments: Paresthesia to fingers.  Good strength bilaterally.  Sensation grossly intact on hand.  Strong pulse.  Paresthesias on bilateral soles of feet.  Good flexion extension at the ankle.  Also paresthesias to lips and mid chin.     ED Results / Procedures / Treatments   Labs (all labs ordered are listed, but  only abnormal results are displayed) Labs Reviewed - No data to display  EKG None  Radiology No results found.  Procedures Procedures    Medications Ordered in ED Medications - No data to display  ED Course/ Medical Decision Making/ A&P                                 Medical Decision Making  Patient with stocking and glove paresthesias.  Worse on toes and up to ankles at times.  Also some involvement of face.  Blood work reviewed and reviewed PCP note.  Slight low back pain but to involve legs hands and face would have to be either peripheral nerve or multiple level central cause.  Will discuss with neurology.  Discussed with Dr. Otelia Limes.  We do not see need for urgent/emergent imaging at this time.  Would benefit from neurology follow-up.  As long as it continues  with this stocking glove distribution including the face appears stable for discharge home.  Reviewed PCP note.        Final Clinical Impression(s) / ED Diagnoses Final diagnoses:  Paresthesia    Rx / DC Orders ED Discharge Orders          Ordered    Ambulatory referral to Neurology       Comments: An appointment is requested in approximately: 1 week   04/14/23 2204              Benjiman Core, MD 04/14/23 2234

## 2023-04-15 ENCOUNTER — Emergency Department (HOSPITAL_COMMUNITY): Payer: 59

## 2023-04-15 ENCOUNTER — Encounter (HOSPITAL_COMMUNITY): Payer: Self-pay

## 2023-04-15 ENCOUNTER — Other Ambulatory Visit: Payer: Self-pay

## 2023-04-15 ENCOUNTER — Emergency Department (HOSPITAL_COMMUNITY)
Admission: EM | Admit: 2023-04-15 | Discharge: 2023-04-15 | Disposition: A | Discharge status: Home / Self Care | Payer: 59 | Attending: Emergency Medicine | Admitting: Emergency Medicine

## 2023-04-15 ENCOUNTER — Ambulatory Visit: Payer: Self-pay | Admitting: Family Medicine

## 2023-04-15 ENCOUNTER — Encounter: Payer: Self-pay | Admitting: Neurology

## 2023-04-15 DIAGNOSIS — R202 Paresthesia of skin: Secondary | ICD-10-CM | POA: Diagnosis present

## 2023-04-15 DIAGNOSIS — M545 Low back pain, unspecified: Secondary | ICD-10-CM | POA: Insufficient documentation

## 2023-04-15 DIAGNOSIS — Z20822 Contact with and (suspected) exposure to covid-19: Secondary | ICD-10-CM | POA: Diagnosis not present

## 2023-04-15 LAB — COMPREHENSIVE METABOLIC PANEL
ALT: 34 U/L (ref 0–44)
AST: 23 U/L (ref 15–41)
Albumin: 5.1 g/dL — ABNORMAL HIGH (ref 3.5–5.0)
Alkaline Phosphatase: 86 U/L (ref 38–126)
Anion gap: 10 (ref 5–15)
BUN: 11 mg/dL (ref 6–20)
CO2: 25 mmol/L (ref 22–32)
Calcium: 10 mg/dL (ref 8.9–10.3)
Chloride: 98 mmol/L (ref 98–111)
Creatinine, Ser: 0.57 mg/dL (ref 0.44–1.00)
GFR, Estimated: >60 mL/min (ref >60)
Glucose, Bld: 240 mg/dL — ABNORMAL HIGH (ref 70–99)
Potassium: 3.6 mmol/L (ref 3.5–5.1)
Sodium: 133 mmol/L — ABNORMAL LOW (ref 135–145)
Total Bilirubin: 1.6 mg/dL — ABNORMAL HIGH (ref 0.0–1.2)
Total Protein: 8.9 g/dL — ABNORMAL HIGH (ref 6.5–8.1)

## 2023-04-15 LAB — URINALYSIS, ROUTINE W REFLEX MICROSCOPIC
Bacteria, UA: NONE SEEN
Bilirubin Urine: NEGATIVE
Glucose, UA: >=500 mg/dL — AB
Ketones, ur: 5 mg/dL — AB
Nitrite: NEGATIVE
Protein, ur: 100 mg/dL — AB
Specific Gravity, Urine: 1.017 (ref 1.005–1.030)
pH: 6 (ref 5.0–8.0)

## 2023-04-15 LAB — CBC WITH DIFFERENTIAL/PLATELET
Abs Immature Granulocytes: 0.01 10*3/uL (ref 0.00–0.07)
Basophils Absolute: 0 10*3/uL (ref 0.0–0.1)
Basophils Relative: 0 %
Eosinophils Absolute: 0 10*3/uL (ref 0.0–0.5)
Eosinophils Relative: 0 %
HCT: 46.5 % — ABNORMAL HIGH (ref 36.0–46.0)
Hemoglobin: 15.9 g/dL — ABNORMAL HIGH (ref 12.0–15.0)
Immature Granulocytes: 0 %
Lymphocytes Relative: 16 %
Lymphs Abs: 1 10*3/uL (ref 0.7–4.0)
MCH: 30.4 pg (ref 26.0–34.0)
MCHC: 34.2 g/dL (ref 30.0–36.0)
MCV: 88.9 fL (ref 80.0–100.0)
Monocytes Absolute: 0.1 10*3/uL (ref 0.1–1.0)
Monocytes Relative: 2 %
Neutro Abs: 5.1 10*3/uL (ref 1.7–7.7)
Neutrophils Relative %: 82 %
Platelets: 187 10*3/uL (ref 150–400)
RBC: 5.23 MIL/uL — ABNORMAL HIGH (ref 3.87–5.11)
RDW: 11.6 % (ref 11.5–15.5)
WBC: 6.3 10*3/uL (ref 4.0–10.5)
nRBC: 0 % (ref 0.0–0.2)

## 2023-04-15 LAB — TROPONIN I (HIGH SENSITIVITY): Troponin I (High Sensitivity): <2 ng/L (ref <18)

## 2023-04-15 LAB — PREGNANCY, URINE: Preg Test, Ur: NEGATIVE

## 2023-04-15 LAB — RESP PANEL BY RT-PCR (RSV, FLU A&B, COVID)  RVPGX2
Influenza A by PCR: NEGATIVE
Influenza B by PCR: NEGATIVE
Resp Syncytial Virus by PCR: NEGATIVE
SARS Coronavirus 2 by RT PCR: NEGATIVE

## 2023-04-15 LAB — MAGNESIUM: Magnesium: 2.1 mg/dL (ref 1.7–2.4)

## 2023-04-15 MED ORDER — IBUPROFEN 800 MG PO TABS
800.0000 mg | ORAL_TABLET | Freq: Once | ORAL | Status: AC
  Administered 2023-04-15: 800 mg via ORAL
  Filled 2023-04-15: qty 1

## 2023-04-15 MED ORDER — LIDOCAINE 5 % EX PTCH
1.0000 | MEDICATED_PATCH | CUTANEOUS | Status: DC
  Administered 2023-04-15: 1 via TRANSDERMAL
  Filled 2023-04-15: qty 1

## 2023-04-15 MED ORDER — GADOBUTROL 1 MMOL/ML IV SOLN
6.5000 mL | Freq: Once | INTRAVENOUS | Status: AC | PRN
  Administered 2023-04-15: 6.5 mL via INTRAVENOUS

## 2023-04-15 NOTE — Telephone Encounter (Signed)
Noted she's at the ER for further evaluation.  Will await ER evaluation.

## 2023-04-15 NOTE — ED Provider Notes (Signed)
[place] EMERGENCY DEPARTMENT AT [facility] Provider Note   CSN: [id number] Arrival date & time: 04/15/23  1031     History  Chief Complaint  Patient presents with   Numbness    Becky Shaw is a 53 y.o. female, history of prediabetes, hyperlipidemia, who presents to the ED 2/2 to numbness, her fingers and toes, that started on Friday 1/17.  She states it was initially only her fingers and toes, and now has progressed to her face including her lips, nose, and tongue.  Denies any, numbness of her chest, abdomen, or pelvis.  She also states that she is now having some low back pain, that is achy, and denies any urinary symptoms.  Denies any history of IV drug use, or recent fevers or chills.  She also reports some nausea that started today, reports reduced p.o. intake secondary to low back pain.  Also states that she has been having a cough for the last couple weeks, and feels like its gotten worse, states it dry.     Home Medications Prior to Admission medications   Medication Sig Start Date End Date Taking? Authorizing Provider  acetic acid-hydrocortisone (VOSOL-HC) OTIC solution Place 4 drops into both ears 2 (two) times daily. 09/10/22   Eustaquio Boyden, MD  amLODipine (NORVASC) 2.5 MG tablet Take 1 tablet (2.5 mg total) by mouth daily. 09/10/22   Eustaquio Boyden, MD  atorvastatin (LIPITOR) 10 MG tablet Take 1 tablet (10 mg total) by mouth every Monday, Wednesday, and Friday. 02/10/23   Eustaquio Boyden, MD  b complex vitamins capsule Take 1 capsule by mouth daily. 09/10/22   Eustaquio Boyden, MD  Flaxseed, Linseed, (FLAX SEED OIL) 1000 MG CAPS Take 1 capsule (1,000 mg total) by mouth daily. With omega 3 fatty acids 09/10/22   Eustaquio Boyden, MD  Multiple Vitamin (MULTIVITAMIN ADULT) TABS Take 1 tablet by mouth daily. 09/10/22   Eustaquio Boyden, MD  Turmeric 500 MG CAPS Take 1 Capful by mouth 3 (three) times daily as needed. 09/10/22   Eustaquio Boyden, MD       Allergies    Cetirizine hcl    Review of Systems   Review of Systems  Cardiovascular:  Negative for chest pain.  Gastrointestinal:  Positive for nausea.  Neurological:  Positive for numbness. Negative for facial asymmetry and headaches.    Physical Exam Updated Vital Signs BP 133/80   Pulse 88   Temp 98 F (36.7 C)   Resp 16   Ht 5' 1.25" (1.556 m)   Wt 64 kg   SpO2 99%   BMI 26.44 kg/m  Physical Exam Vitals and nursing note reviewed.  Constitutional:      General: She is not in acute distress.    Appearance: She is well-developed.  HENT:     Head: Normocephalic and atraumatic.  Eyes:     Conjunctiva/sclera: Conjunctivae normal.  Cardiovascular:     Rate and Rhythm: Normal rate and regular rhythm.     Heart sounds: No murmur heard. Pulmonary:     Effort: Pulmonary effort is normal. No respiratory distress.     Breath sounds: Normal breath sounds.  Abdominal:     Palpations: Abdomen is soft.     Tenderness: There is no abdominal tenderness.  Musculoskeletal:        General: No swelling.     Cervical back: Neck supple.  Skin:    General: Skin is warm and dry.     Capillary Refill: Capillary refill takes less  than 2 seconds.  Neurological:     Mental Status: She is alert.     Cranial Nerves: Cranial nerves 2-12 are intact. No cranial nerve deficit or dysarthria.     Sensory: Sensory deficit present.     Motor: Motor function is intact.     Coordination: Coordination is intact.     Comments: +paresthesia of mouth, nose, fingers and toes. No paresthesias to arms/legs or palms/soles. Negative Romberg. Able to button and take off shoes. Normal finger to nose. Coordination intact.   Psychiatric:        Mood and Affect: Mood normal.     ED Results / Procedures / Treatments   Labs (all labs ordered are listed, but only abnormal results are displayed) Labs Reviewed  CBC WITH DIFFERENTIAL/PLATELET - Abnormal; Notable for the following components:      Result  Value   RBC 5.23 (*)    Hemoglobin 15.9 (*)    HCT 46.5 (*)    All other components within normal limits  COMPREHENSIVE METABOLIC PANEL - Abnormal; Notable for the following components:   Sodium 133 (*)    Glucose, Bld 240 (*)    Total Protein 8.9 (*)    Albumin 5.1 (*)    Total Bilirubin 1.6 (*)    All other components within normal limits  URINALYSIS, ROUTINE W REFLEX MICROSCOPIC - Abnormal; Notable for the following components:   APPearance HAZY (*)    Glucose, UA >=500 (*)    Hgb urine dipstick Becky Shaw (*)    Ketones, ur 5 (*)    Protein, ur 100 (*)    Leukocytes,Ua TRACE (*)    All other components within normal limits  RESP PANEL BY RT-PCR (RSV, FLU A&B, COVID)  RVPGX2  PREGNANCY, URINE  MAGNESIUM  TROPONIN I (HIGH SENSITIVITY)    EKG EKG Interpretation Date/Time:  Tuesday April 15 2023 10:37:51 EST Ventricular Rate:  75 PR Interval:  173 QRS Duration:  92 QT Interval:  517 QTC Calculation: 578 R Axis:   87  Text Interpretation: Sinus rhythm Atrial premature complex Nonspecific T abnrm, anterolateral leads Prolonged QT interval No old tracing to compare Confirmed by Derwood Kaplan [phone]) on 04/15/2023 8:27:28 PM  Radiology MR Cervical Spine W and Wo Contrast Result Date: 04/15/2023 CLINICAL DATA:  Initial evaluation for acute paresthesias of face and fingers. EXAM: MRI CERVICAL SPINE WITHOUT AND WITH CONTRAST TECHNIQUE: Multiplanar and multiecho pulse sequences of the cervical spine, to include the craniocervical junction and cervicothoracic junction, were obtained without and with intravenous contrast. CONTRAST:  6.63mL GADAVIST GADOBUTROL 1 MMOL/ML IV SOLN COMPARISON:  Concomitant brain MRI performed at the same time. FINDINGS: Alignment: Trace scoliosis. Straightening of the normal cervical lordosis without significant listhesis. Vertebrae: Vertebral body height maintained without acute or chronic fracture. Bone marrow signal intensity within normal limits. No  worrisome osseous lesions. No abnormal marrow edema or enhancement. Cord: Normal signal and morphology.  No abnormal enhancement. Posterior Fossa, vertebral arteries, paraspinal tissues: Craniocervical junction within normal limits. Paraspinous soft tissues normal. Normal flow voids seen within the vertebral arteries bilaterally. 9 mm right thyroid nodule noted, of doubtful significance given size and patient age, no follow-up imaging recommended (ref: J Am Coll Radiol. 2015 Feb;12(2): 143-50). Disc levels: C2-C3: Yu Cr[place]un central disc protrusion minimally indents the ventral thecal sac. No spinal stenosis. Foramina remain patent. C3-C4: Shallow central disc protrusion mildly indents the ventral thecal sac. Minimal cord flattening without cord signal changes. No significant spinal stenosis. Foramina remain patent. C4-C5: Mild  disc bulge with uncovertebral spurring. Mild flattening of the ventral thecal sac without significant spinal stenosis. Foramina remain patent. C5-C6: Mild disc bulge with right greater than left uncovertebral spurring. Flattening of the ventral thecal sac without significant spinal stenosis. Foramina remain patent. C6-C7: Minimal disc bulge with uncovertebral spurring. No canal or foraminal stenosis. C7-T1:  Unremarkable. T1-2: Unremarkable. T2-3: Seen only on sagittal projection. Gustavus Haskin central disc protrusion mildly indents the ventral thecal sac. No spinal stenosis. Foramina remain patent. IMPRESSION: 1. Normal MRI appearance of the cervical spinal cord. 2. Mild noncompressive disc bulging at C2-3 through C6-7 without significant stenosis or overt neural impingement. Electronically Signed   By: Rise Mu M.D.   On: 04/15/2023 20:02   MR Brain W and Wo Contrast Result Date: 04/15/2023 CLINICAL DATA:  Initial evaluation for acute facial and limb paresthesias. EXAM: MRI HEAD WITHOUT AND WITH CONTRAST TECHNIQUE: Multiplanar, multiecho pulse sequences of the brain and surrounding  structures were obtained without and with intravenous contrast. CONTRAST:  6.76mL GADAVIST GADOBUTROL 1 MMOL/ML IV SOLN COMPARISON:  None Available. FINDINGS: Brain: Cerebral volume within normal limits. Subcentimeter remote lacunar infarct noted at the posterior aspect of the right lentiform nucleus (series 9, image 24). No significant cerebral white matter disease elsewhere within the brain. No evidence for acute or subacute ischemia. Gray-white matter differentiation otherwise maintained. No other areas of chronic cortical infarction. No acute or chronic intracranial blood products. No mass lesion, midline shift or mass effect. No hydrocephalus or extra-axial fluid collection. Pituitary gland suprasellar region within normal limits. No abnormal enhancement. Vascular: Major intracranial vascular flow voids are maintained. Skull and upper cervical spine: Craniocervical junction within normal limits. Bone marrow signal intensity normal. No scalp soft tissue abnormality. Sinuses/Orbits: Globes orbital soft tissues within normal limits. Paranasal sinuses are clear. No mastoid effusion. Other: None. IMPRESSION: 1. No acute intracranial abnormality. 2. Subcentimeter remote lacunar infarct at the posterior aspect of the right lentiform nucleus. 3. Otherwise normal brain MRI. Electronically Signed   By: Rise Mu M.D.   On: 04/15/2023 19:56   DG Chest 2 View Result Date: 04/15/2023 CLINICAL DATA:  Nonproductive cough.  Nausea. EXAM: CHEST - 2 VIEW COMPARISON:  None Available. FINDINGS: The heart size and mediastinal contours are within normal limits. Both lungs are clear. The visualized skeletal structures are unremarkable. IMPRESSION: No active cardiopulmonary disease. Electronically Signed   By: Danae Orleans M.D.   On: 04/15/2023 13:11    Procedures Procedures    Medications Ordered in ED Medications  lidocaine (LIDODERM) 5 % 1 patch (1 patch Transdermal Patch Applied 04/15/23 2102)  gadobutrol  (GADAVIST) 1 MMOL/ML injection 6.5 mL (6.5 mLs Intravenous Contrast Given 04/15/23 1853)  ibuprofen (ADVIL) tablet 800 mg (800 mg Oral Given 04/15/23 2103)    ED Course/ Medical Decision Making/ A&P                                 Medical Decision Making Patient is a 53 year old female, here for numbness of her fingers, toes, and nose, as well as mouth.  Has been getting worse, started on Friday, has progressively gotten worse.  We will obtain basic labs, for further evaluation.  Also complains of back pain, no evidence of erythema, redness, warmth.  Will obtain a urinalysis to check out/rule out UTI.  Pain is worse with movement.  Likely musculoskeletal pain?  Also complains of a cough this summer after last 2 weeks.  Will obtain chest x-ray, COVID/flu swab for this.    Amount and/or Complexity of Data Reviewed Labs: ordered.    Details: Labs are fairly unremarkable Radiology: ordered.    Details: Chest x-ray clear, MRI of the brain shows lacunar infarct, remote, MRI, noncompressive disc bulging Discussion of management or test interpretation with external provider(s): Patient is a 53 year old female, here for neurosymptoms, she has came to the ER twice for the same same symptoms, I reach out to Dr. Iver Nestle, she recommended that I obtain an MRI of her cervical and brain, for further evaluation with and without.  She also recommended I get magnesium, do a full neuroexam.  The only findings on my exam, were paresthesias of the nose, mouth, tongue, fingers and toes.  MRI shows no acute findings.  Findings of a lacunar infarct, discussed with Dr. Rhunette Croft, ED evaluated patient, cleared for discharge home.  Instructed to follow-up with neurology, she has appointment with neurology next week.  Believe that back pain is likely musculoskeletal in nature.  Discharged home. Strict ER return precautions  Risk Prescription drug management.   Final Clinical Impression(s) / ED Diagnoses Final diagnoses:   Paresthesias  Acute bilateral low back pain without sciatica    Rx / DC Orders ED Discharge Orders     None         Lew Prout, [place], PA 04/15/23 2155    Derwood Kaplan, MD 04/16/23 1135

## 2023-04-15 NOTE — Telephone Encounter (Signed)
Copied from CRM [id number]. Topic: Clinical - Red Word Triage >> Apr 15, 2023  9:47 AM Sonny Dandy B wrote: [facility] that prompted transfer to Nurse Triage: feet, legs are numb, hand are tingling. Lower back pain.   Feet are tight   Chief Complaint: Back pain Symptoms: Lower back pain, Upper abdominal/lower chest pain, scattered numbness not new with back pain Frequency: Constant  Pertinent Negatives: Patient denies loss of bowel or bladder control Disposition: [x] ED /[] Urgent Care (no appt availability in office) / [] Appointment(In office/virtual)/ []  [facility]/ [] Home Care/ [] Refused Recommended Disposition /[] [facility]/ []  Follow-up with PCP Additional Notes: Patient reports she began to experience lower back pain yesterday that has been worsening. She states that her pain is 10/10 and caused her difficulty sleeping. She states she is also experiencing upper abdominal/lower chest pain. Patient denies any loss of bowel or bladder control. Patient denies any history of similar back pain. Patient states she is also experiencing numbness that is not new with her back pain and that she has already been evaluated for. Patient verbalized understanding and agreement of this plan.      Reason for Disposition  [1] SEVERE abdominal pain AND [2] present > 1 hour  Answer Assessment - Initial Assessment Questions 1. ONSET: "When did the pain begin?"      Yesterday  2. LOCATION: "Where does it hurt?" (upper, mid or lower back)     Lower back  3. SEVERITY: "How bad is the pain?"  (e.g., Scale 1-10; mild, moderate, or severe)   - MILD (1-3): Doesn't interfere with normal activities.    - MODERATE (4-7): Interferes with normal activities or awakens from sleep.    - SEVERE (8-10): Excruciating pain, unable to do any normal activities.      10/10 4. PATTERN: "Is the pain constant?" (e.g., yes, no; constant, intermittent)      Constant  5. RADIATION: "Does the pain shoot into your legs  or somewhere else?"     No radiation  6. CAUSE:  "What do you think is causing the back pain?"      Unsure 7. BACK OVERUSE:  "Any recent lifting of heavy objects, strenuous work or exercise?"     No 8. MEDICINES: "What have you taken so far for the pain?" (e.g., nothing, acetaminophen, NSAIDS)     Tylenol and Aleve without relief  9. NEUROLOGIC SYMPTOMS: "Do you have any weakness, numbness, or problems with bowel/bladder control?"     Patient experiencing numbness, not new with back pain  10. OTHER SYMPTOMS: "Do you have any other symptoms?" (e.g., fever, abdomen pain, burning with urination, blood in urine)       Pain in upper mid-abdomen/lower chest 11. PREGNANCY: "Is there any chance you are pregnant?" "When was your last menstrual period?"       No  Protocols used: Back Pain-A-AH

## 2023-04-15 NOTE — ED Triage Notes (Addendum)
Patient has had full body numbness since last night. Has a dry cough. Was seen yesterday for the same. Not improving. Has low back pain and nausea since last night.

## 2023-04-15 NOTE — Progress Notes (Signed)
These are curbside recommendations based upon the information readily available in the chart on brief review as well as history and examination information provided to me by requesting provider and do not replace a full detailed consult  Spoke with ED PA Barnie Alderman regarding this patient with past medical history significant for type 2 diabetes, hyperlipidemia, BMI 26.44, hypertension   Latest Reference Range & Units 12/07/21 11:05 09/03/22 08:52 02/10/23 09:25  Hemoglobin A1C 4.0 - 5.6 % -  5.7 ! 7.5 (H) 7.1 ! Pend   She has had multiple presentations recently to the ED as well as her PCP for worsening paresthesias.  These are described to me as involving bilateral fingertips and bilateral feet without involvement of the arms, but today involvement of her face and tongue, still without involvement of the arms other than fingertips.  She does not have any motor symptoms and no other concerns on ED providers examination  PCP evaluation included B12 testing which was normal at 605, TSH of 2.54, reassuring CBC and CMP other than elevated glucose -- glucose is further elevated today at 240  I recommend confirmation that her symptoms are nondisabling --including assessment of her ability to button buttons for example with her eyes closed, Romberg testing (observe for stability of stance with her eyes closed) Additionally MRI brain and cervical spine with and without contrast to assess for inflammatory process If these studies are reassuring, recommend greater attention to glucose control, outpatient follow-up for EMG/nerve conduction study and skin biopsy which are not available inpatient within our system  Brooke Dare MD-PhD [facility] [phone] Available 7 AM to 7 PM, outside these hours please contact Neurologist on call listed on AMION   8 minutes spent in care, majority in discussion with PA Barnie Alderman, who has notified the patient of neurological consultation

## 2023-04-15 NOTE — Discharge Instructions (Signed)
Please follow-up with your primary care doctor, and your neurologist.  Today there is findings of the lacunar stroke, which is an old stroke, that was found.  Please talk to neurology about this, and talk to them about your paresthesias.  I do not think that this is the cause of your numbness/tingling.  Believe that you also have a back strain, your urine was good as well as your blood work

## 2023-04-16 ENCOUNTER — Ambulatory Visit: Payer: 59 | Admitting: Family Medicine

## 2023-04-16 ENCOUNTER — Ambulatory Visit: Payer: Self-pay | Admitting: Family Medicine

## 2023-04-16 ENCOUNTER — Encounter: Payer: Self-pay | Admitting: Family Medicine

## 2023-04-16 VITALS — BP 124/68 | HR 83 | Temp 97.8°F | Ht 61.25 in | Wt 139.1 lb

## 2023-04-16 DIAGNOSIS — M549 Dorsalgia, unspecified: Secondary | ICD-10-CM

## 2023-04-16 DIAGNOSIS — Z7984 Long term (current) use of oral hypoglycemic drugs: Secondary | ICD-10-CM

## 2023-04-16 DIAGNOSIS — E041 Nontoxic single thyroid nodule: Secondary | ICD-10-CM

## 2023-04-16 DIAGNOSIS — R209 Unspecified disturbances of skin sensation: Secondary | ICD-10-CM | POA: Insufficient documentation

## 2023-04-16 DIAGNOSIS — E1169 Type 2 diabetes mellitus with other specified complication: Secondary | ICD-10-CM

## 2023-04-16 DIAGNOSIS — R202 Paresthesia of skin: Secondary | ICD-10-CM

## 2023-04-16 DIAGNOSIS — Z780 Asymptomatic menopausal state: Secondary | ICD-10-CM

## 2023-04-16 DIAGNOSIS — E7841 Elevated Lipoprotein(a): Secondary | ICD-10-CM | POA: Diagnosis not present

## 2023-04-16 DIAGNOSIS — E785 Hyperlipidemia, unspecified: Secondary | ICD-10-CM

## 2023-04-16 DIAGNOSIS — Z8673 Personal history of transient ischemic attack (TIA), and cerebral infarction without residual deficits: Secondary | ICD-10-CM

## 2023-04-16 DIAGNOSIS — R779 Abnormality of plasma protein, unspecified: Secondary | ICD-10-CM | POA: Insufficient documentation

## 2023-04-16 LAB — COMPREHENSIVE METABOLIC PANEL
ALT: 30 U/L (ref 0–35)
AST: 19 U/L (ref 0–37)
Albumin: 5.2 g/dL (ref 3.5–5.2)
Alkaline Phosphatase: 96 U/L (ref 39–117)
BUN: 9 mg/dL (ref 6–23)
CO2: 30 meq/L (ref 19–32)
Calcium: 10.2 mg/dL (ref 8.4–10.5)
Chloride: 90 meq/L — ABNORMAL LOW (ref 96–112)
Creatinine, Ser: 0.53 mg/dL (ref 0.40–1.20)
GFR: 106.72 mL/min (ref >60.00)
Glucose, Bld: 207 mg/dL — ABNORMAL HIGH (ref 70–99)
Potassium: 3.3 meq/L — ABNORMAL LOW (ref 3.5–5.1)
Sodium: 128 meq/L — ABNORMAL LOW (ref 135–145)
Total Bilirubin: 1.7 mg/dL — ABNORMAL HIGH (ref 0.2–1.2)
Total Protein: 8.5 g/dL — ABNORMAL HIGH (ref 6.0–8.3)

## 2023-04-16 LAB — C-REACTIVE PROTEIN: CRP: <1 mg/dL (ref 0.5–20.0)

## 2023-04-16 LAB — LIPID PANEL
Cholesterol: 186 mg/dL (ref 0–200)
HDL: 52.3 mg/dL (ref >39.00)
LDL Cholesterol: 115 mg/dL — ABNORMAL HIGH (ref 0–99)
NonHDL: 133.78
Total CHOL/HDL Ratio: 4
Triglycerides: 96 mg/dL (ref 0.0–149.0)
VLDL: 19.2 mg/dL (ref 0.0–40.0)

## 2023-04-16 LAB — SEDIMENTATION RATE: Sed Rate: 33 mm/h — ABNORMAL HIGH (ref 0–30)

## 2023-04-16 MED ORDER — ASPIRIN 81 MG PO TBEC: 81.0000 mg | DELAYED_RELEASE_TABLET | Freq: Every day | ORAL | Status: AC

## 2023-04-16 MED ORDER — CYCLOBENZAPRINE HCL 10 MG PO TABS: 5.0000 mg | ORAL_TABLET | Freq: Two times a day (BID) | ORAL | 0 refills | Status: DC | PRN

## 2023-04-16 NOTE — Progress Notes (Unsigned)
Ph: [phone] Fax: [phone]   Patient ID: Becky Shaw, female    DOB: 1970-06-03, 53 y.o.   MRN: [id number]  This visit was conducted in person.  BP 124/68   Pulse 83   Temp 97.8 F (36.6 C) (Oral)   Ht 5' 1.25" (1.556 m)   Wt 139 lb 2 oz (63.1 kg)   SpO2 98%   BMI 26.07 kg/m    CC: ongoing low back pain  Subjective:   HPI: Becky Shaw is a 53 y.o. female presenting on 04/16/2023 for Back Pain (C/o ongoing low back pain. Seen on 04/15/23 at [facility] ED, dx paraesthesias; acute low back pain w/o sciatica. Pt accompanied by husband, Thao. )   See recent OVs, UCC eval and ER evaluation.  Friday last week started having numbness to bilateral toes, this progressed over 2 days to fingertips then to lips/mouth as well as cold sensation to all these areas.  Lower back pain started last night. Today it has progressed to thoracic spine, no longer having lower back pain.  Denies inciting trauma/injury or falls.  She tried [employer] supplement - which may have helped foot paresthesias.  Tried coining to back without benefit.  Tried tylenol yesterday and aleve today without benefit.   She notes nausea/vomiting x1 this morning.  Notes dry cough and itchy throat over the past 3 weeks - son and husband also sick recently.  Bending over caused nausea but no vertigo or dizziness.  No leg weakness, fevers/chills, vision changes, HA, slurred speech, chest pain, dyspnea, cough, dizziness, palpitations.  No recent cough, diarrhea, skin infections, bug bites, tick bites.   Recent trip to [place] and [place] in October.  Took typhoid oral vaccine but didn't need malaria ppx given location of travel. Denies mosquito bite.   So far unrevealing evaluation including brain MRI with and without contrast and cervical spine MRI with and without contrast.  Labs showed glu 240, glucosuria, total protein elevated at 8.9, Cr 0.57.   Incidental finding of remote stroke: Subcentimeter remote lacunar  infarct at the posterior aspect of the right lentiform nucleus.  She notes one side of face lower than the other when looking at faces since 2021  She continues atorvastatin 10mg  twice weekly - tolerated better than Crestor (knee pain).  Will start aspirin 81mg  daily.  Also incidental 9mm R thyroid nodule of uncertain significance.  Lab Results  Component Value Date   TSH 2.54 04/14/2023    Lab Results  Component Value Date   VITAMINB12 605 04/14/2023     Pending neurology evaluation 04/28/2023 Allena Katz)  Lab Results  Component Value Date   CHOL 122 09/03/2022   HDL 44.40 09/03/2022   LDLCALC 57 09/03/2022   TRIG 105.0 09/03/2022   CHOLHDL 3 09/03/2022     LMP 3 yrs ago ~2022. Ongoing hot flashes at night. See above re OTC Estroven     Relevant past medical, surgical, family and social history reviewed and updated as indicated. Interim medical history since our last visit reviewed. Allergies and medications reviewed and updated. Outpatient Medications Prior to Visit  Medication Sig Dispense Refill   amLODipine (NORVASC) 2.5 MG tablet Take 1 tablet (2.5 mg total) by mouth daily. 90 tablet 4   acetic acid-hydrocortisone (VOSOL-HC) OTIC solution Place 4 drops into both ears 2 (two) times daily. (Patient not taking: Reported on 04/16/2023) 10 mL 0   [START ON 04/17/2023] atorvastatin (LIPITOR) 10 MG tablet Take 1 tablet (10 mg total) by mouth 2 (two)  times a week.     atorvastatin (LIPITOR) 10 MG tablet Take 1 tablet (10 mg total) by mouth every Monday, Wednesday, and Friday. (Patient not taking: Reported on 04/16/2023) 30 tablet 3   b complex vitamins capsule Take 1 capsule by mouth daily. (Patient not taking: Reported on 04/16/2023)     Flaxseed, Linseed, (FLAX SEED OIL) 1000 MG CAPS Take 1 capsule (1,000 mg total) by mouth daily. With omega 3 fatty acids (Patient not taking: Reported on 04/16/2023)     Multiple Vitamin (MULTIVITAMIN ADULT) TABS Take 1 tablet by mouth daily. (Patient not  taking: Reported on 04/16/2023)     Turmeric 500 MG CAPS Take 1 Capful by mouth 3 (three) times daily as needed. (Patient not taking: Reported on 04/16/2023)     No facility-administered medications prior to visit.     Per HPI unless specifically indicated in ROS section below Review of Systems  Objective:  BP 124/68   Pulse 83   Temp 97.8 F (36.6 C) (Oral)   Ht 5' 1.25" (1.556 m)   Wt 139 lb 2 oz (63.1 kg)   SpO2 98%   BMI 26.07 kg/m   Wt Readings from Last 3 Encounters:  04/16/23 139 lb 2 oz (63.1 kg)  04/15/23 141 lb 1.5 oz (64 kg)  04/14/23 139 lb (63 kg)      Physical Exam    Results for orders placed or performed during the hospital encounter of 04/15/23  CBC with Differential   Collection Time: 04/15/23 11:03 AM  Result Value Ref Range   WBC 6.3 4.0 - 10.5 K/uL   RBC 5.23 (H) 3.87 - 5.11 MIL/uL   Hemoglobin 15.9 (H) 12.0 - 15.0 g/dL   HCT 69.6 (H) 29.5 - 28.4 %   MCV 88.9 80.0 - 100.0 fL   MCH 30.4 26.0 - 34.0 pg   MCHC 34.2 30.0 - 36.0 g/dL   RDW 13.2 44.0 - 10.2 %   Platelets 187 150 - 400 K/uL   nRBC 0.0 0.0 - 0.2 %   Neutrophils Relative % 82 %   Neutro Abs 5.1 1.7 - 7.7 K/uL   Lymphocytes Relative 16 %   Lymphs Abs 1.0 0.7 - 4.0 K/uL   Monocytes Relative 2 %   Monocytes Absolute 0.1 0.1 - 1.0 K/uL   Eosinophils Relative 0 %   Eosinophils Absolute 0.0 0.0 - 0.5 K/uL   Basophils Relative 0 %   Basophils Absolute 0.0 0.0 - 0.1 K/uL   Immature Granulocytes 0 %   Abs Immature Granulocytes 0.01 0.00 - 0.07 K/uL  Comprehensive metabolic panel   Collection Time: 04/15/23 11:03 AM  Result Value Ref Range   Sodium 133 (L) 135 - 145 mmol/L   Potassium 3.6 3.5 - 5.1 mmol/L   Chloride 98 98 - 111 mmol/L   CO2 25 22 - 32 mmol/L   Glucose, Bld 240 (H) 70 - 99 mg/dL   BUN 11 6 - 20 mg/dL   Creatinine, Ser 7.25 0.44 - 1.00 mg/dL   Calcium 36.6 8.9 - 44.0 mg/dL   Total Protein 8.9 (H) 6.5 - 8.1 g/dL   Albumin 5.1 (H) 3.5 - 5.0 g/dL   AST 23 15 - 41 U/L    ALT 34 0 - 44 U/L   Alkaline Phosphatase 86 38 - 126 U/L   Total Bilirubin 1.6 (H) 0.0 - 1.2 mg/dL   GFR, Estimated >34 >74 mL/min   Anion gap 10 5 - 15  Pregnancy, urine  Collection Time: 04/15/23  1:00 PM  Result Value Ref Range   Preg Test, Ur NEGATIVE NEGATIVE  Urinalysis, Routine w reflex microscopic -Urine, Clean Catch   Collection Time: 04/15/23  1:00 PM  Result Value Ref Range   Color, Urine YELLOW YELLOW   APPearance HAZY (A) CLEAR   Specific Gravity, Urine 1.017 1.005 - 1.030   pH 6.0 5.0 - 8.0   Glucose, UA >=500 (A) NEGATIVE mg/dL   Hgb urine dipstick SMALL (A) NEGATIVE   Bilirubin Urine NEGATIVE NEGATIVE   Ketones, ur 5 (A) NEGATIVE mg/dL   Protein, ur 253 (A) NEGATIVE mg/dL   Nitrite NEGATIVE NEGATIVE   Leukocytes,Ua TRACE (A) NEGATIVE   RBC / HPF 0-5 0 - 5 RBC/hpf   WBC, UA 0-5 0 - 5 WBC/hpf   Bacteria, UA NONE SEEN NONE SEEN   Squamous Epithelial / HPF 0-5 0 - 5 /HPF   Mucus PRESENT   Resp panel by RT-PCR (RSV, Flu A&B, Covid) Urine, Clean Catch   Collection Time: 04/15/23  1:26 PM   Specimen: Urine, Clean Catch; Nasal Swab  Result Value Ref Range   SARS Coronavirus 2 by RT PCR NEGATIVE NEGATIVE   Influenza A by PCR NEGATIVE NEGATIVE   Influenza B by PCR NEGATIVE NEGATIVE   Resp Syncytial Virus by PCR NEGATIVE NEGATIVE  Magnesium   Collection Time: 04/15/23  4:30 PM  Result Value Ref Range   Magnesium 2.1 1.7 - 2.4 mg/dL  Troponin I (High Sensitivity)   Collection Time: 04/15/23  4:30 PM  Result Value Ref Range   Troponin I (High Sensitivity) <2 <18 ng/L   MR Cervical Spine W and Wo Contrast CLINICAL DATA:  Initial evaluation for acute paresthesias of face and fingers.  EXAM: MRI CERVICAL SPINE WITHOUT AND WITH CONTRAST  TECHNIQUE: Multiplanar and multiecho pulse sequences of the cervical spine, to include the craniocervical junction and cervicothoracic junction, were obtained without and with intravenous contrast.  CONTRAST:  6.74mL GADAVIST  GADOBUTROL 1 MMOL/ML IV SOLN  COMPARISON:  Concomitant brain MRI performed at the same time.  FINDINGS: Alignment: Trace scoliosis. Straightening of the normal cervical lordosis without significant listhesis.  Vertebrae: Vertebral body height maintained without acute or chronic fracture. Bone marrow signal intensity within normal limits. No worrisome osseous lesions. No abnormal marrow edema or enhancement.  Cord: Normal signal and morphology.  No abnormal enhancement.  Posterior Fossa, vertebral arteries, paraspinal tissues: Craniocervical junction within normal limits. Paraspinous soft tissues normal. Normal flow voids seen within the vertebral arteries bilaterally. 9 mm right thyroid nodule noted, of doubtful significance given size and patient age, no follow-up imaging recommended (ref: J Am Coll Radiol. 2015 Feb;12(2): 143-50).  Disc levels:  C2-C3: Small central disc protrusion minimally indents the ventral thecal sac. No spinal stenosis. Foramina remain patent.  C3-C4: Shallow central disc protrusion mildly indents the ventral thecal sac. Minimal cord flattening without cord signal changes. No significant spinal stenosis. Foramina remain patent.  C4-C5: Mild disc bulge with uncovertebral spurring. Mild flattening of the ventral thecal sac without significant spinal stenosis. Foramina remain patent.  C5-C6: Mild disc bulge with right greater than left uncovertebral spurring. Flattening of the ventral thecal sac without significant spinal stenosis. Foramina remain patent.  C6-C7: Minimal disc bulge with uncovertebral spurring. No canal or foraminal stenosis.  C7-T1:  Unremarkable.  T1-2: Unremarkable.  T2-3: Seen only on sagittal projection. Small central disc protrusion mildly indents the ventral thecal sac. No spinal stenosis. Foramina remain patent.  IMPRESSION: 1. Normal  MRI appearance of the cervical spinal cord. 2. Mild noncompressive disc bulging at C2-3  through C6-7 without significant stenosis or overt neural impingement.  Electronically Signed   By: Rise Mu M.D.   On: 04/15/2023 20:02 MR Brain W and Wo Contrast CLINICAL DATA:  Initial evaluation for acute facial and limb paresthesias.  EXAM: MRI HEAD WITHOUT AND WITH CONTRAST  TECHNIQUE: Multiplanar, multiecho pulse sequences of the brain and surrounding structures were obtained without and with intravenous contrast.  CONTRAST:  6.61mL GADAVIST GADOBUTROL 1 MMOL/ML IV SOLN  COMPARISON:  None Available.  FINDINGS: Brain: Cerebral volume within normal limits. Subcentimeter remote lacunar infarct noted at the posterior aspect of the right lentiform nucleus (series 9, image 24). No significant cerebral white matter disease elsewhere within the brain.  No evidence for acute or subacute ischemia. Gray-white matter differentiation otherwise maintained. No other areas of chronic cortical infarction. No acute or chronic intracranial blood products.  No mass lesion, midline shift or mass effect. No hydrocephalus or extra-axial fluid collection. Pituitary gland suprasellar region within normal limits. No abnormal enhancement.  Vascular: Major intracranial vascular flow voids are maintained.  Skull and upper cervical spine: Craniocervical junction within normal limits. Bone marrow signal intensity normal. No scalp soft tissue abnormality.  Sinuses/Orbits: Globes orbital soft tissues within normal limits. Paranasal sinuses are clear. No mastoid effusion.  Other: None.  IMPRESSION: 1. No acute intracranial abnormality. 2. Subcentimeter remote lacunar infarct at the posterior aspect of the right lentiform nucleus. 3. Otherwise normal brain MRI.  Electronically Signed   By: Rise Mu M.D.   On: 04/15/2023 19:56 DG Chest 2 View CLINICAL DATA:  Nonproductive cough.  Nausea.  EXAM: CHEST - 2 VIEW  COMPARISON:  None Available.  FINDINGS: The  heart size and mediastinal contours are within normal limits. Both lungs are clear. The visualized skeletal structures are unremarkable.  IMPRESSION: No active cardiopulmonary disease.  Electronically Signed   By: Danae Orleans M.D.   On: 04/15/2023 13:11  Assessment & Plan:   Problem List Items Addressed This Visit     Hyperlipidemia   Relevant Medications   atorvastatin (LIPITOR) 10 MG tablet (Start on 04/17/2023)   aspirin EC 81 MG tablet   Other Relevant Orders   Lipid panel   Paresthesias   Relevant Orders   Comprehensive metabolic panel   Sedimentation rate   C-reactive protein   History of stroke in adulthood   Disturbance of skin sensation - Primary   Relevant Orders   Comprehensive metabolic panel   Sedimentation rate   C-reactive protein   Elevated blood protein   Relevant Orders   Serum protein electrophoresis with reflex     Meds ordered this encounter  Medications   aspirin EC 81 MG tablet    Sig: Take 1 tablet (81 mg total) by mouth daily. Swallow whole.   cyclobenzaprine (FLEXERIL) 10 MG tablet    Sig: Take 0.5-1 tablets (5-10 mg total) by mouth 2 (two) times daily as needed for muscle spasms (sedation precautions).    Dispense:  30 tablet    Refill:  0    Orders Placed This Encounter  Procedures   Lipid panel   Comprehensive metabolic panel   Sedimentation rate   C-reactive protein   Serum protein electrophoresis with reflex    Patient Instructions  Start aspirin 81mg  daily over the counter. Continue atorvastatin 10mg  twice a week.  Labs today May take flexeril muscle relaxant 5-10mg  twice daily as needed for back pain  Keep neurology appointment  Letter out of work provided for this week.   Follow up plan: Return if symptoms worsen or fail to improve.  Eustaquio Boyden, MD

## 2023-04-16 NOTE — Telephone Encounter (Signed)
Copied from CRM [id number]. Topic: Clinical - Red Word Triage >> Apr 16, 2023  8:22 AM Becky Shaw wrote: Red Word that prompted transfer to Nurse Triage: Severe back pain. Hands have tingling and cold constantly. Feet, mouth, tongue, and chin are cold and have tingling intermittently. Patient began having symptoms on Friday. Was seen in ER yesterday, testing came back normal. Is rating pain a 10 for back pain.  ER yesterday and everything came back normal  Tingling started Friday  Symptoms are ongoing - tingling in feet and cold Hands are cold as well ER found nothing - MRI xray and everything came back normal May be related to menopause - bought multivitamin Numbness tingling and cold    Chief Complaint: severe back pain Symptoms: feeling cold, numbness and tingling in hands, feet, tongue and chin - referred to neurology by ER Frequency: ongoing since Friday Pertinent Negatives: Patient denies urinary symptoms Disposition: [] ED /[] Urgent Care (no appt availability in office) / [x] Appointment(In office/virtual)/ []  [facility]/ [] Home Care/ [] Refused Recommended Disposition /[] [facility]/ []  Follow-up with PCP Additional Notes: The patient has ongoing severe back pain. The pain is ongoing and unrelieved by over the counter medication.  She is requesting a pain reliever. She was seen in the ER yesterday and multiple tests were run.  She was referred to neurology for numbness and tingling in her hands, feet, tongue and chin.  An appointment is scheduled for next week.  She is concerned that her symptoms may be related to menopause.  She took over the [employer] and it helped lessen her back pain when she took it the first time but it caused rib pain and nausea.  The second time she took it, it did not help with her back pain.  She would like to explore treatment related to menopause.  She stated that due to her pain, she is unable to go to the office.  She was scheduled for  a same day virtual appointment. Reason for Disposition  [1] SEVERE back pain (e.g., excruciating, unable to do any normal activities) AND [2] not improved 2 hours after pain medicine  Answer Assessment - Initial Assessment Questions 1. ONSET: "When did the pain begin?"      Friday  2. LOCATION: "Where does it hurt?" (upper, mid or lower back)     Lower back radiating up  3. SEVERITY: "How bad is the pain?"  (e.g., Scale 1-10; mild, moderate, or severe)   - MILD (1-3): Doesn't interfere with normal activities.    - MODERATE (4-7): Interferes with normal activities or awakens from sleep.    - SEVERE (8-10): Excruciating pain, unable to do any normal activities.      10/10 unable to sleep 4. PATTERN: "Is the pain constant?" (e.g., yes, no; constant, intermittent)      Constant  5. RADIATION: "Does the pain shoot into your legs or somewhere else?"     none 6. CAUSE:  "What do you think is causing the back pain?"      menopause 7. BACK OVERUSE:  "Any recent lifting of heavy objects, strenuous work or exercise?"     none 8. MEDICINES: "What have you taken so far for the pain?" (e.g., nothing, acetaminophen, NSAIDS)     Tylenol and Aleve were unhelpful 9. NEUROLOGIC SYMPTOMS: "Do you have any weakness, numbness, or problems with bowel/bladder control?"     Tingling in feet and hands, tongue, lips and chin 10. OTHER SYMPTOMS: "Do you have  any other symptoms?" (e.g., fever, abdomen pain, burning with urination, blood in urine)       After taking Estroven (for menopause relief) experienced nausea and rib pain - helped back pain with first dose but caused other symptoms; second dose was unhelpful  Protocols used: Back Pain-A-AH

## 2023-04-16 NOTE — Patient Instructions (Addendum)
Start aspirin 81mg  daily over the counter. Continue atorvastatin 10mg  twice a week.  Labs today May take flexeril muscle relaxant 5-10mg  twice daily as needed for back pain Keep neurology appointment  Letter out of work provided for this week.

## 2023-04-16 NOTE — Telephone Encounter (Signed)
Noted  

## 2023-04-17 ENCOUNTER — Encounter: Payer: Self-pay | Admitting: Family Medicine

## 2023-04-17 DIAGNOSIS — M549 Dorsalgia, unspecified: Secondary | ICD-10-CM | POA: Insufficient documentation

## 2023-04-17 DIAGNOSIS — Z780 Asymptomatic menopausal state: Secondary | ICD-10-CM | POA: Insufficient documentation

## 2023-04-17 DIAGNOSIS — E041 Nontoxic single thyroid nodule: Secondary | ICD-10-CM | POA: Insufficient documentation

## 2023-04-17 MED ORDER — B COMPLEX VITAMINS PO CAPS: 1.0000 | ORAL_CAPSULE | Freq: Every day | ORAL | Status: DC

## 2023-04-17 MED ORDER — METFORMIN HCL ER 500 MG PO TB24: 500.0000 mg | ORAL_TABLET | Freq: Every day | ORAL | 3 refills | Status: DC

## 2023-04-17 NOTE — Assessment & Plan Note (Addendum)
Low back pain x 2 days, then progressed to mid thoracic back, with concomitant numbness/paresthesias.  Overall reassuring exam without signs of radiculopathy or other red flags (bowel/bladder incontinence, fever, inciting trauma, or weakness of legs).  Contrasted brain and cervical spine imaging without signs of demyelinating disease.  Rx flexeril PRN.  Agree with neurology evaluation scheduled for early next month.

## 2023-04-17 NOTE — Assessment & Plan Note (Signed)
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 Trouble tolerating Crestor.

## 2023-04-17 NOTE — Addendum Note (Signed)
Addended by: Eustaquio Boyden on: 04/17/2023 10:02 AM   Modules accepted: Orders

## 2023-04-17 NOTE — Assessment & Plan Note (Addendum)
Incidentally noted on recent brain MRI.  She does have strong fmhx stroke and has markedly elevated Lp(a) levels (278).  Continue statin (currently on atorvastatin 10mg  twice weekly), start aspirin 81mg  daily.  She notes abnormal visual perception of other's faces - sees one side of face lower than the other - since 2021 - possibly stroke related. She did have subsequent reassuring eye exam.

## 2023-04-17 NOTE — Assessment & Plan Note (Signed)
Repeat TP with SPEP.

## 2023-04-17 NOTE — Assessment & Plan Note (Signed)
Incidental 9mm R thyroid nodule on brain MRI, of uncertain significance. TSH normal. Will just monitor for now.

## 2023-04-17 NOTE — Assessment & Plan Note (Addendum)
LMP 2022.  She wonders if above symptoms could be due to menopause - she feels OTC Estroven supplement (with black cohosh) may have helped. Did recommend holding any estrogenic supplements at this time given noted remote stroke on head imaging.

## 2023-04-17 NOTE — Assessment & Plan Note (Signed)
Cold sensation to extremities.  Recent TSH and B12 levels normal.

## 2023-04-17 NOTE — Assessment & Plan Note (Signed)
Elevated sugar readings recently - ?malaise due to uncontrolled hyperglycemia - will start metformin XR 500mg  daily.

## 2023-04-17 NOTE — Assessment & Plan Note (Addendum)
Ongoing initially to toes then fingertips and up to lips/mouth - work up so far unrevealing including normal b12 and tsh, brain and cervical neck imaging (with and without contrast). Labs did show elevated sugar to 240 with glucosuria, and elevated total protein levels.  Now with new back pain - see below.  ?hyperglycemia related symptoms as sugars are running higher than normal.  She and family did recently have recent viral URI but no other family members with subsequent similar symptoms.  Recent trip to [place] and [place] - she did receive vivotif oral typhoid vaccine, did not need malaria ppx based on travel locations. Denies recent insect or tick bites.  Update labs today.

## 2023-04-18 LAB — PROTEIN ELECTROPHORESIS, SERUM, WITH REFLEX
Albumin ELP: 5 g/dL — ABNORMAL HIGH (ref 3.8–4.8)
Alpha 1: 0.3 g/dL (ref 0.2–0.3)
Alpha 2: 0.7 g/dL (ref 0.5–0.9)
Beta 2: 0.4 g/dL (ref 0.2–0.5)
Beta Globulin: 0.5 g/dL (ref 0.4–0.6)
Gamma Globulin: 1.7 g/dL (ref 0.8–1.7)
Total Protein: 8.7 g/dL — ABNORMAL HIGH (ref 6.1–8.1)

## 2023-04-19 ENCOUNTER — Inpatient Hospital Stay
Admission: EM | Admit: 2023-04-19 | Discharge: 2023-04-25 | DRG: 095 | Disposition: A | Discharge status: Home Health | Payer: 59 | Attending: Obstetrics and Gynecology | Admitting: Obstetrics and Gynecology

## 2023-04-19 ENCOUNTER — Observation Stay: Payer: 59

## 2023-04-19 ENCOUNTER — Emergency Department: Payer: 59

## 2023-04-19 ENCOUNTER — Other Ambulatory Visit: Payer: Self-pay

## 2023-04-19 DIAGNOSIS — G629 Polyneuropathy, unspecified: Secondary | ICD-10-CM

## 2023-04-19 DIAGNOSIS — G610 Guillain-Barre syndrome: Principal | ICD-10-CM | POA: Diagnosis present

## 2023-04-19 DIAGNOSIS — K5900 Constipation, unspecified: Secondary | ICD-10-CM | POA: Diagnosis present

## 2023-04-19 DIAGNOSIS — E860 Dehydration: Secondary | ICD-10-CM | POA: Diagnosis present

## 2023-04-19 DIAGNOSIS — Z888 Allergy status to other drugs, medicaments and biological substances status: Secondary | ICD-10-CM

## 2023-04-19 DIAGNOSIS — Z9049 Acquired absence of other specified parts of digestive tract: Secondary | ICD-10-CM | POA: Diagnosis not present

## 2023-04-19 DIAGNOSIS — E785 Hyperlipidemia, unspecified: Secondary | ICD-10-CM | POA: Diagnosis present

## 2023-04-19 DIAGNOSIS — E871 Hypo-osmolality and hyponatremia: Secondary | ICD-10-CM | POA: Diagnosis not present

## 2023-04-19 DIAGNOSIS — E222 Syndrome of inappropriate secretion of antidiuretic hormone: Secondary | ICD-10-CM | POA: Diagnosis present

## 2023-04-19 DIAGNOSIS — Z7982 Long term (current) use of aspirin: Secondary | ICD-10-CM | POA: Diagnosis not present

## 2023-04-19 DIAGNOSIS — E1165 Type 2 diabetes mellitus with hyperglycemia: Secondary | ICD-10-CM | POA: Diagnosis present

## 2023-04-19 DIAGNOSIS — Z8249 Family history of ischemic heart disease and other diseases of the circulatory system: Secondary | ICD-10-CM

## 2023-04-19 DIAGNOSIS — R202 Paresthesia of skin: Secondary | ICD-10-CM | POA: Diagnosis not present

## 2023-04-19 DIAGNOSIS — D72829 Elevated white blood cell count, unspecified: Secondary | ICD-10-CM | POA: Diagnosis present

## 2023-04-19 DIAGNOSIS — E876 Hypokalemia: Secondary | ICD-10-CM

## 2023-04-19 DIAGNOSIS — I10 Essential (primary) hypertension: Secondary | ICD-10-CM | POA: Diagnosis present

## 2023-04-19 DIAGNOSIS — Z79899 Other long term (current) drug therapy: Secondary | ICD-10-CM

## 2023-04-19 DIAGNOSIS — Z823 Family history of stroke: Secondary | ICD-10-CM

## 2023-04-19 DIAGNOSIS — R1310 Dysphagia, unspecified: Secondary | ICD-10-CM | POA: Diagnosis present

## 2023-04-19 DIAGNOSIS — E119 Type 2 diabetes mellitus without complications: Secondary | ICD-10-CM | POA: Diagnosis not present

## 2023-04-19 DIAGNOSIS — E1129 Type 2 diabetes mellitus with other diabetic kidney complication: Secondary | ICD-10-CM

## 2023-04-19 HISTORY — DX: Guillain-Barre syndrome: G61.0

## 2023-04-19 LAB — MENINGITIS/ENCEPHALITIS PANEL (CSF)

## 2023-04-19 LAB — CBC
HCT: 47.2 % — ABNORMAL HIGH (ref 36.0–46.0)
Hemoglobin: 17 g/dL — ABNORMAL HIGH (ref 12.0–15.0)
MCH: 29.8 pg (ref 26.0–34.0)
MCHC: 36 g/dL (ref 30.0–36.0)
MCV: 82.7 fL (ref 80.0–100.0)
Platelets: 278 10*3/uL (ref 150–400)
RBC: 5.71 MIL/uL — ABNORMAL HIGH (ref 3.87–5.11)
RDW: 11.7 % (ref 11.5–15.5)
WBC: 15.3 10*3/uL — ABNORMAL HIGH (ref 4.0–10.5)
nRBC: 0 % (ref 0.0–0.2)

## 2023-04-19 LAB — CSF CELL COUNT WITH DIFFERENTIAL
Eosinophils, CSF: 0 %
Eosinophils, CSF: 0 %
Lymphs, CSF: 59 %
Lymphs, CSF: 75 %
Monocyte-Macrophage-Spinal Fluid: 14 %
Monocyte-Macrophage-Spinal Fluid: 24 %
RBC Count, CSF: 19 /mm3 — ABNORMAL HIGH (ref 0–3)
RBC Count, CSF: 386 /mm3 — ABNORMAL HIGH (ref 0–3)
Segmented Neutrophils-CSF: 11 %
Segmented Neutrophils-CSF: 17 %
Tube #: 1
Tube #: 4
WBC, CSF: 9 /mm3 — ABNORMAL HIGH (ref 0–5)
WBC, CSF: 9 /mm3 — ABNORMAL HIGH (ref 0–5)

## 2023-04-19 LAB — BASIC METABOLIC PANEL
Anion gap: 13 (ref 5–15)
Anion gap: 15 (ref 5–15)
BUN: 10 mg/dL (ref 6–20)
BUN: 12 mg/dL (ref 6–20)
CO2: 22 mmol/L (ref 22–32)
CO2: 24 mmol/L (ref 22–32)
Calcium: 8.8 mg/dL — ABNORMAL LOW (ref 8.9–10.3)
Calcium: 9.4 mg/dL (ref 8.9–10.3)
Chloride: 86 mmol/L — ABNORMAL LOW (ref 98–111)
Chloride: 93 mmol/L — ABNORMAL LOW (ref 98–111)
Creatinine, Ser: 0.4 mg/dL — ABNORMAL LOW (ref 0.44–1.00)
Creatinine, Ser: 0.55 mg/dL (ref 0.44–1.00)
GFR, Estimated: >60 mL/min (ref >60)
GFR, Estimated: >60 mL/min (ref >60)
Glucose, Bld: 213 mg/dL — ABNORMAL HIGH (ref 70–99)
Glucose, Bld: 219 mg/dL — ABNORMAL HIGH (ref 70–99)
Potassium: 2.8 mmol/L — ABNORMAL LOW (ref 3.5–5.1)
Potassium: 3.5 mmol/L (ref 3.5–5.1)
Sodium: 125 mmol/L — ABNORMAL LOW (ref 135–145)
Sodium: 128 mmol/L — ABNORMAL LOW (ref 135–145)

## 2023-04-19 LAB — SODIUM, URINE, RANDOM: Sodium, Ur: 22 mmol/L

## 2023-04-19 LAB — TROPONIN I (HIGH SENSITIVITY)
Troponin I (High Sensitivity): 3 ng/L (ref <18)
Troponin I (High Sensitivity): 4 ng/L (ref <18)

## 2023-04-19 LAB — MAGNESIUM: Magnesium: 2.6 mg/dL — ABNORMAL HIGH (ref 1.7–2.4)

## 2023-04-19 LAB — OSMOLALITY: Osmolality: 274 mosm/kg — ABNORMAL LOW (ref 275–295)

## 2023-04-19 LAB — PHOSPHORUS: Phosphorus: 3.1 mg/dL (ref 2.5–4.6)

## 2023-04-19 LAB — PROTEIN AND GLUCOSE, CSF
Glucose, CSF: 144 mg/dL — ABNORMAL HIGH (ref 40–70)
Total  Protein, CSF: >600 mg/dL — ABNORMAL HIGH (ref 15–45)

## 2023-04-19 LAB — POC URINE PREG, ED: Preg Test, Ur: NEGATIVE

## 2023-04-19 LAB — OSMOLALITY, URINE: Osmolality, Ur: 510 mosm/kg (ref 300–900)

## 2023-04-19 LAB — HIV ANTIBODY (ROUTINE TESTING W REFLEX): HIV Screen 4th Generation wRfx: NONREACTIVE

## 2023-04-19 LAB — PROCALCITONIN: Procalcitonin: <0.1 ng/mL

## 2023-04-19 MED ORDER — LORAZEPAM 2 MG/ML IJ SOLN
1.0000 mg | Freq: Once | INTRAMUSCULAR | Status: AC
  Administered 2023-04-19: 1 mg via INTRAVENOUS
  Filled 2023-04-19: qty 1

## 2023-04-19 MED ORDER — POTASSIUM CHLORIDE 10 MEQ/100ML IV SOLN
10.0000 meq | Freq: Once | INTRAVENOUS | Status: AC
  Administered 2023-04-19: 10 meq via INTRAVENOUS
  Filled 2023-04-19: qty 100

## 2023-04-19 MED ORDER — DEXTROSE 5 % IV SOLN
10.0000 mg/kg | Freq: Three times a day (TID) | INTRAVENOUS | Status: DC
  Administered 2023-04-19 – 2023-04-20 (×2): 630 mg via INTRAVENOUS
  Filled 2023-04-19 (×3): qty 12.6

## 2023-04-19 MED ORDER — METFORMIN HCL ER 500 MG PO TB24
500.0000 mg | ORAL_TABLET | Freq: Every day | ORAL | Status: DC
  Administered 2023-04-20 – 2023-04-21 (×2): 500 mg via ORAL
  Filled 2023-04-19 (×2): qty 1

## 2023-04-19 MED ORDER — POTASSIUM CHLORIDE CRYS ER 20 MEQ PO TBCR
40.0000 meq | EXTENDED_RELEASE_TABLET | Freq: Once | ORAL | Status: AC
  Administered 2023-04-19: 40 meq via ORAL
  Filled 2023-04-19: qty 2

## 2023-04-19 MED ORDER — ATORVASTATIN CALCIUM 10 MG PO TABS
10.0000 mg | ORAL_TABLET | ORAL | Status: DC
  Administered 2023-04-21 – 2023-04-24 (×2): 10 mg via ORAL
  Filled 2023-04-19 (×2): qty 1

## 2023-04-19 MED ORDER — ASPIRIN 81 MG PO TBEC
81.0000 mg | DELAYED_RELEASE_TABLET | Freq: Every day | ORAL | Status: DC
  Administered 2023-04-19 – 2023-04-25 (×7): 81 mg via ORAL
  Filled 2023-04-19 (×7): qty 1

## 2023-04-19 MED ORDER — ENOXAPARIN SODIUM 40 MG/0.4ML IJ SOSY
40.0000 mg | PREFILLED_SYRINGE | INTRAMUSCULAR | Status: DC
Start: 2023-04-19 — End: 2023-04-25
  Administered 2023-04-19 – 2023-04-25 (×7): 40 mg via SUBCUTANEOUS
  Filled 2023-04-19 (×7): qty 0.4

## 2023-04-19 MED ORDER — OXYCODONE HCL 5 MG PO TABS
5.0000 mg | ORAL_TABLET | Freq: Four times a day (QID) | ORAL | Status: DC | PRN
  Administered 2023-04-20 – 2023-04-25 (×3): 5 mg via ORAL
  Filled 2023-04-19 (×3): qty 1

## 2023-04-19 MED ORDER — AMLODIPINE BESYLATE 5 MG PO TABS
2.5000 mg | ORAL_TABLET | Freq: Every day | ORAL | Status: DC
  Administered 2023-04-19 – 2023-04-25 (×6): 2.5 mg via ORAL
  Filled 2023-04-19 (×7): qty 1

## 2023-04-19 MED ORDER — VANCOMYCIN HCL 1500 MG/300ML IV SOLN
1500.0000 mg | Freq: Once | INTRAVENOUS | Status: AC
  Administered 2023-04-19: 1500 mg via INTRAVENOUS
  Filled 2023-04-19 (×2): qty 300

## 2023-04-19 MED ORDER — ACETAMINOPHEN 325 MG PO TABS: 650.0000 mg | ORAL_TABLET | Freq: Four times a day (QID) | ORAL | Status: DC | PRN

## 2023-04-19 MED ORDER — SODIUM CHLORIDE 0.9 % IV BOLUS
1000.0000 mL | Freq: Once | INTRAVENOUS | Status: AC
  Administered 2023-04-19: 1000 mL via INTRAVENOUS

## 2023-04-19 MED ORDER — POTASSIUM CHLORIDE CRYS ER 20 MEQ PO TBCR
40.0000 meq | EXTENDED_RELEASE_TABLET | Freq: Once | ORAL | Status: AC
Start: 2023-04-19 — End: 2023-04-19
  Administered 2023-04-19: 40 meq via ORAL
  Filled 2023-04-19: qty 2

## 2023-04-19 MED ORDER — SODIUM CHLORIDE 0.9 % IV SOLN
2.0000 g | Freq: Two times a day (BID) | INTRAVENOUS | Status: DC
  Administered 2023-04-19 (×2): 2 g via INTRAVENOUS
  Filled 2023-04-19 (×3): qty 20

## 2023-04-19 MED ORDER — CYCLOBENZAPRINE HCL 10 MG PO TABS
5.0000 mg | ORAL_TABLET | Freq: Two times a day (BID) | ORAL | Status: DC | PRN
  Administered 2023-04-20 – 2023-04-23 (×3): 10 mg via ORAL
  Filled 2023-04-19 (×3): qty 1

## 2023-04-19 MED ORDER — ONDANSETRON HCL 4 MG/2ML IJ SOLN: 4.0000 mg | Freq: Four times a day (QID) | INTRAMUSCULAR | Status: DC | PRN

## 2023-04-19 MED ORDER — VANCOMYCIN HCL 1250 MG/250ML IV SOLN
1250.0000 mg | INTRAVENOUS | Status: DC
  Administered 2023-04-19: 1250 mg via INTRAVENOUS
  Filled 2023-04-19: qty 250

## 2023-04-19 NOTE — Consult Note (Addendum)
NEUROLOGY CONSULT NOTE   Date of service: April 19, 2023 Patient Name: Becky Shaw MRN:  [id number] DOB:  15-Nov-1970 Chief Complaint: "Tingling and numbness of 1 week duration" Requesting Provider: Minna Antis, MD  History of Present Illness  Becky Shaw is a 53 y.o. female with hx of diabetes, hyperlipidemia, hypertension presenting for evaluation of tingling and numbness now for the third or fourth time at different urgent cares and ERs. She initially started having some tingling and numbness in her fingertips and toes as well as feeling of her tongue being heavy and swollen but the feelings were transient and intermittent to begin with when they started last Friday.  Since then it has become more bothersome and has been having the symptoms for most part of the day.  She also reports of some dysphagia specifically for solid foods.  Over the last day or so, she also reports that she has been having trouble walking because she is not able to feel the ground as well as she normally does and that is keeping her from walking normally.  She also reports some subjective weakness in her legs and arms as well as handgrips.  She denies any preceding infections over the last month or 2.  No sick contacts. She had MRI of the brain and C-spine done 4 days ago-which did not reveal any significant findings to explain her symptoms.  She is back to the [place] [facility] ER for evaluation due to persistent symptoms.  She has not had similar episodes in the past.   ROS  Comprehensive ROS performed and pertinent positives documented in HPI    Past History   Past Medical History:  Diagnosis Date   ALLERGIC RHINITIS 05/17/2007   GANGLION CYST, WRIST, RIGHT 05/31/2008   Hyperlipidemia    OVERACTIVE BLADDER 05/15/2007   Prediabetes     Past Surgical History:  Procedure Laterality Date   CHOLECYSTECTOMY      Family History: Family History  Problem Relation Age of Onset   Stroke Father 32    Hypertension Father    Hypertension Sister    Hypertension Brother    Stroke Brother 48   Transient ischemic attack Brother 42   Stroke Brother 59       massive   Hypertension Brother    Hypertension Brother     Social History  reports that she has never smoked. She has never used smokeless tobacco. She reports that she does not currently use alcohol. She reports that she does not use drugs.  Allergies  Allergen Reactions   Cetirizine Hcl     REACTION: insomnia    Medications   Current Facility-Administered Medications:    potassium chloride 10 mEq in 100 mL IVPB, 10 mEq, Intravenous, Once, Loleta Rose, MD  Current Outpatient Medications:    amLODipine (NORVASC) 2.5 MG tablet, Take 1 tablet (2.5 mg total) by mouth daily., Disp: 90 tablet, Rfl: 4   aspirin EC 81 MG tablet, Take 1 tablet (81 mg total) by mouth daily. Swallow whole., Disp: , Rfl:    atorvastatin (LIPITOR) 10 MG tablet, Take 1 tablet (10 mg total) by mouth 2 (two) times a week., Disp: , Rfl:    cyclobenzaprine (FLEXERIL) 10 MG tablet, Take 0.5-1 tablets (5-10 mg total) by mouth 2 (two) times daily as needed for muscle spasms (sedation precautions)., Disp: 30 tablet, Rfl: 0   metFORMIN (GLUCOPHAGE-XR) 500 MG 24 hr tablet, Take 1 tablet (500 mg total) by mouth daily with breakfast., Disp: 30 tablet,  Rfl: 3  Vitals   Vitals:   04/19/23 0011 04/19/23 0213 04/19/23 0645 04/19/23 0700  BP:  (!) 124/92  (!) 149/85  Pulse:  97 89 78  Resp:  18 18 18   Temp:  97.9 F (36.6 C)    TempSrc:  Oral    SpO2:  97% 100% 100%  Weight: 63 kg     Height: 5\' 1"  (1.549 m)       Body mass index is 26.24 kg/m.  Physical Exam  General: Awake alert in no distress HEENT: Normocephalic atraumatic Lungs are clear Cardiovascular: Regular Neurological exam Awake alert oriented x 3 No dysarthria or aphasia. Cranial nerves II to XII intact. Motor examination with symmetric 4+/5 strength in all 4 extremities. Sensation is  diminished in the legs in a stocking pattern and also has some glove like distribution of sensory diminishment in the upper extremities. Coordination: No dysmetria DTRs: 2+ all over with downgoing toes.  Labs/Imaging/Neurodiagnostic studies   CBC:  Recent Labs  Lab 04-20-2023 1103 04/19/23 0014  WBC 6.3 15.3*  NEUTROABS 5.1  --   HGB 15.9* 17.0*  HCT 46.5* 47.2*  MCV 88.9 82.7  PLT 187 278   Basic Metabolic Panel:  Lab Results  Component Value Date   NA 125 (L) 04/19/2023   K 2.8 (L) 04/19/2023   CO2 24 04/19/2023   GLUCOSE 219 (H) 04/19/2023   BUN 12 04/19/2023   CREATININE 0.55 04/19/2023   CALCIUM 9.4 04/19/2023   GFRNONAA >60 04/19/2023   GFRAA >60 03/12/2015   Lipid Panel:  Lab Results  Component Value Date   LDLCALC 115 (H) 04/16/2023   HgbA1c:  Lab Results  Component Value Date   HGBA1C 7.1 (A) 02/10/2023   MR C-spine (personally reviewed): No acute findings.  Normal appearance of the cervical spinal cord.  Mild noncompressive disc bulging at C2-3 through C6-7 without significant stenosis or neural impingement  MRI Brain(Personally reviewed): No acute findings.  Subcentimeter lacunar infarct in the posterior aspect of the right lentiform nucleus  B12, TSH normal.  A1c 7.1.  ASSESSMENT   Becky Shaw is a 53 y.o. female with past history of diabetes tension, hyperlipidemia presenting with 5 to 7 days worth of what she describes as paresthesias tingling numbness in her fingers and toes along with heaviness in her tongue and dysphagia that has progressed over the last week to the point where she is having poor p.o. intake and has also been having more trouble walking over the last day or so. Her examination reveals glove and stocking pattern of sensory loss with no clear sensory level. She does have intact DTRs in both upper and lower extremities. Her labs reveal hyponatremia, which is a new finding for her. Given the neuropathic findings, progressive  dysphagia and gait abnormality-with testing showing normal brain and C-spine imaging and lab work showing hyponatremia that is new-this raises question of if this is some sort of acute inflammatory demyelinating polyneuropathy AIDP or 1 of its variants. Please note that DTRs can remain present in a significant number of AIDP cases and presence of reflexes does not preclude the diagnosis of AIDP.   RECOMMENDATIONS  I would recommend a spinal tap.  Please obtain at least 10 cc of fluid.  CSF to be sent for glucose, protein, cell count, meningitis/encephalitis panel to rule out any infectious process, oligoclonal bands, IgG index. If there is albumin cytological dissociation, will recommend treatment with IVIG. If there is any infectious process-treatment will be directed  to that although other than leukocytosis, she does not look toxic and has no meningitic signs. If all of the above workup is unremarkable I have requested the ED provider Dr. Lenard Lance to see if he has time and is scheduled to attempt a spinal tap.  Otherwise we will need fluoro LP Plan was discussed with Dr. Mikey College and Dr. Lenard Lance ______________________________________________________________________    Signed, Milon Dikes, MD [facility]

## 2023-04-19 NOTE — H&P (Addendum)
History and Physical    Becky Shaw ZOX:[id number] DOB: 10-20-1970 DOA: 04/19/2023  PCP: Eustaquio Boyden, MD (Confirm with patient/family/NH records and if not entered, this has to be entered at [facility] point of entry) Patient coming from: Home  I have personally briefly reviewed patient's old medical records in [facility]  Chief Complaint: Numbness and tingling of fingertips toes and tongue  HPI: Becky Shaw is a 53 y.o. female with medical history significant of HTN, newly diagnosed diabetes presented with numbness tingling of toes, fingertips and tongue  Started 5 days ago, patient started develop bilateral toes tingling sensations, some extended to bilateral feet bilateral hands fingertips and lips and changes denied any weakness at that point.  Went to [facility] ED on 1/21, brain MRI showed no acute findings but remote lacunar stroke and patient discharged home and referred to neurology.  Over the last 2 to 3 days, patient has developed worsening of above symptoms, and the patient she started to have balance problem and tendency to fall.  Denied any headache no neck pain no fever or chills.  Yesterday, she started to have feeling of nausea and " seem to have trouble swallowing, breathing she has tried swallow including food and water she would throw up right away" but denied any abdominal pain no diarrhea.  She was just recently diagnosed with diabetes and is yet to start metformin.  ED Course: Afebrile, tachycardia blood pressure 120/90 nonhypoxic.  Blood work showed WBC 15, hemoglobin 17, K2.8, sodium 125  1000 ml iv bolus given in ED.  Review of Systems: As per HPI otherwise 14 point review of systems negative.    Past Medical History:  Diagnosis Date   ALLERGIC RHINITIS 05/17/2007   GANGLION CYST, WRIST, RIGHT 05/31/2008   Hyperlipidemia    OVERACTIVE BLADDER 05/15/2007   Prediabetes     Past Surgical History:  Procedure Laterality Date   CHOLECYSTECTOMY        reports that she has never smoked. She has never used smokeless tobacco. She reports that she does not currently use alcohol. She reports that she does not use drugs.  Allergies  Allergen Reactions   Cetirizine Hcl     REACTION: insomnia    Family History  Problem Relation Age of Onset   Stroke Father 24   Hypertension Father    Hypertension Sister    Hypertension Brother    Stroke Brother 48   Transient ischemic attack Brother 58   Stroke Brother 59       massive   Hypertension Brother    Hypertension Brother      Prior to Admission medications   Medication Sig Start Date End Date Taking? Authorizing Provider  amLODipine (NORVASC) 2.5 MG tablet Take 1 tablet (2.5 mg total) by mouth daily. 09/10/22   Eustaquio Boyden, MD  aspirin EC 81 MG tablet Take 1 tablet (81 mg total) by mouth daily. Swallow whole. 04/16/23   Eustaquio Boyden, MD  atorvastatin (LIPITOR) 10 MG tablet Take 1 tablet (10 mg total) by mouth 2 (two) times a week. 04/17/23   Eustaquio Boyden, MD  cyclobenzaprine (FLEXERIL) 10 MG tablet Take 0.5-1 tablets (5-10 mg total) by mouth 2 (two) times daily as needed for muscle spasms (sedation precautions). 04/16/23   Eustaquio Boyden, MD  metFORMIN (GLUCOPHAGE-XR) 500 MG 24 hr tablet Take 1 tablet (500 mg total) by mouth daily with breakfast. 04/17/23   Eustaquio Boyden, MD    Physical Exam: Vitals:   04/19/23 8119 04/19/23  0645 04/19/23 0700 04/19/23 0941  BP: (!) 124/92  (!) 149/85 (!) 169/93  Pulse: 97 89 78 86  Resp: 18 18 18 18   Temp: 97.9 F (36.6 C)   97.9 F (36.6 C)  TempSrc: Oral     SpO2: 97% 100% 100% 100%  Weight:      Height:        Constitutional: NAD, calm, comfortable Vitals:   04/19/23 0213 04/19/23 0645 04/19/23 0700 04/19/23 0941  BP: (!) 124/92  (!) 149/85 (!) 169/93  Pulse: 97 89 78 86  Resp: 18 18 18 18   Temp: 97.9 F (36.6 C)   97.9 F (36.6 C)  TempSrc: Oral     SpO2: 97% 100% 100% 100%  Weight:      Height:        Eyes: PERRL, lids and conjunctivae normal ENMT: Mucous membranes are moist. Posterior pharynx clear of any exudate or lesions.Normal dentition.  Neck: normal, supple, no masses, no thyromegaly Respiratory: clear to auscultation bilaterally, no wheezing, no crackles. Normal respiratory effort. No accessory muscle use.  Cardiovascular: Regular rate and rhythm, no murmurs / rubs / gallops. No extremity edema. 2+ pedal pulses. No carotid bruits.  Abdomen: no tenderness, no masses palpated. No hepatosplenomegaly. Bowel sounds positive.  Musculoskeletal: no clubbing / cyanosis. No joint deformity upper and lower extremities. Good ROM, no contractures. Normal muscle tone.  Skin: no rashes, lesions, ulcers. No induration Neurologic: CN 2-12 grossly intact. Sensation intact, DTR normal. Strength 5/5 in all 4.  Psychiatric: Normal judgment and insight. Alert and oriented x 3. Normal mood.     Labs on Admission: I have personally reviewed following labs and imaging studies  CBC: Recent Labs  Lab 04/14/23 0953 04/15/23 1103 04/19/23 0014  WBC 7.2 6.3 15.3*  NEUTROABS  --  5.1  --   HGB 15.5* 15.9* 17.0*  HCT 45.7 46.5* 47.2*  MCV 90.4 88.9 82.7  PLT 210.0 187 278   Basic Metabolic Panel: Recent Labs  Lab 04/14/23 0953 04/15/23 1103 04/15/23 1630 04/16/23 1228 04/19/23 0014 04/19/23 0212  NA 142 133*  --  128* 125*  --   K 3.6 3.6  --  3.3* 2.8*  --   CL 102 98  --  90* 86*  --   CO2 28 25  --  30 24  --   GLUCOSE 161* 240*  --  207* 219*  --   BUN 13 11  --  9 12  --   CREATININE 0.58 0.57  --  0.53 0.55  --   CALCIUM 10.1 10.0  --  10.2 9.4  --   MG  --   --  2.1  --   --  2.6*   GFR: Estimated Creatinine Clearance: 70.8 mL/min (by C-G formula based on SCr of 0.55 mg/dL). Liver Function Tests: Recent Labs  Lab 04/14/23 0953 04/15/23 1103 04/16/23 1228  AST 22 23 19   ALT 31 34 30  ALKPHOS 94 86 96  BILITOT 1.4* 1.6* 1.7*  PROT 8.1 8.9* 8.7*  8.5*  ALBUMIN 5.0 5.1*  5.2   No results for input(s): "LIPASE", "AMYLASE" in the last 168 hours. No results for input(s): "AMMONIA" in the last 168 hours. Coagulation Profile: No results for input(s): "INR", "PROTIME" in the last 168 hours. Cardiac Enzymes: No results for input(s): "CKTOTAL", "CKMB", "CKMBINDEX", "TROPONINI" in the last 168 hours. BNP (last 3 results) No results for input(s): "PROBNP" in the last 8760 hours. HbA1C: No results for input(s): "  HGBA1C" in the last 72 hours. CBG: No results for input(s): "GLUCAP" in the last 168 hours. Lipid Profile: Recent Labs    04/16/23 1228  CHOL 186  HDL 52.30  LDLCALC 115*  TRIG 96.0  CHOLHDL 4   Thyroid Function Tests: No results for input(s): "TSH", "T4TOTAL", "FREET4", "T3FREE", "THYROIDAB" in the last 72 hours. Anemia Panel: No results for input(s): "VITAMINB12", "FOLATE", "FERRITIN", "TIBC", "IRON", "RETICCTPCT" in the last 72 hours. Urine analysis:    Component Value Date/Time   COLORURINE YELLOW 04/15/2023 1300   APPEARANCEUR HAZY (A) 04/15/2023 1300   LABSPEC 1.017 04/15/2023 1300   PHURINE 6.0 04/15/2023 1300   GLUCOSEU >=500 (A) 04/15/2023 1300   GLUCOSEU NEGATIVE 05/01/2016 0917   HGBUR SMALL (A) 04/15/2023 1300   BILIRUBINUR NEGATIVE 04/15/2023 1300   KETONESUR 5 (A) 04/15/2023 1300   PROTEINUR 100 (A) 04/15/2023 1300   UROBILINOGEN 0.2 05/01/2016 0917   NITRITE NEGATIVE 04/15/2023 1300   LEUKOCYTESUR TRACE (A) 04/15/2023 1300    Radiological Exams on Admission: DG Lumbar Spine 2-3 Views Result Date: 04/19/2023 CLINICAL DATA:  Low back pain. EXAM: LUMBAR SPINE - 2-3 VIEW COMPARISON:  None Available. FINDINGS: There is no evidence of lumbar spine fracture. Alignment is normal. Intervertebral disc spaces are maintained. Mild degenerative osteophytosis seen at L2-3 and L3-4. IMPRESSION: No acute findings. Mild degenerative osteophytosis at L2-3 and L3-4, without disc space narrowing. Electronically Signed   By: Danae Orleans M.D.    On: 04/19/2023 10:07   DG Chest 2 View Result Date: 04/19/2023 CLINICAL DATA:  Numbness EXAM: CHEST - 2 VIEW COMPARISON:  Chest x-ray 04/15/2023 FINDINGS: The heart size and mediastinal contours are within normal limits. Both lungs are clear. The visualized skeletal structures are unremarkable. IMPRESSION: No active cardiopulmonary disease. Electronically Signed   By: Darliss Cheney M.D.   On: 04/19/2023 00:36    EKG: Pending  Assessment/Plan Principal Problem:   Hyponatremia Active Problems:   Neuropathy   GBS (Guillain Barre syndrome) (HCC)  (please populate well all problems here in Problem List. (For example, if patient is on BP meds at home and you resume or decide to hold them, it is a problem that needs to be her. Same for CAD, COPD, HLD and so on)  Paresthesia, subacute Acute ataxia -Differential is wide, discussed with on-call neurology Dr. Wilford Corner, who recommend LP to rule out Guillain-Barr syndrome  -Admit to medical telemetry floor -NIF BID -Other DDx, peripheral neuropathy should be considered as well.  Given there is a new onset of diabetes.  Recent brain MRI only showed remote lacunar stroke, but no new acute findings.  Hyponatremia -Acute, and progressively getting worse since last Friday. -Clinically patient appeared to be mild volume contracted versus euvolemic.  Check hyponatremia study -Fluid restriction, recheck sodium level this afternoon  Hypokalemia -Probably related to poor oral intake for the last 2 to 3 days -IV and p.o. replacement, recheck K level this afternoon -Magnesium level normal  Leukocytosis -Denies any cough, no urinary symptoms no diarrhea.  CNS infection is under investigation along with LP. -Monitor off antibiotics for now  IIDM with hyperglycemia -Continue metformin, A1c= 7.1  HTN -Continue amlodipine  DVT prophylaxis: Lovenox Code Status: Full code Family Communication: Husband at bedside Disposition Plan: Patient is sick with  worsening of hyponatremia and worsening of CNS symptoms including paresthesia and ataxia, requiring inpatient neurology consultation, expect more than 2 midnight hospital stay Consults called: Neurology Admission status: Telemetry admission   Emeline General MD [facility] Pager [phone] 04/19/2023, 10:13 AM

## 2023-04-19 NOTE — ED Provider Notes (Signed)
-----------------------------------------   10:01 AM on 04/19/2023 ----------------------------------------- Patient care assumed from Dr. York Cerise.  Dr. Wilford Corner of neurology has seen the patient.  Patient is admitted to the medical service for paresthesias and hyponatremia.  Neurology would like a lumbar puncture performed to rule out demyelinating conditions and Guillain-Barr.  Lumbar puncture performed by myself with no complication 1 attempt.  All 4 tubes are xanthochromic.  Neurology made aware.  LUMBAR PUNCTURE  Date/Time: 04/19/2023 at 10:02 AM Performed by: Minna Antis  Consent: Verbal consent obtained. Written consent obtained. Risks and benefits: risks, benefits and alternatives were discussed Consent given by: patient Patient understanding: patient states understanding of the procedure being performed  Patient consent: the patient's understanding of the procedure matches consent given  Procedure consent: procedure consent matches procedure scheduled  Relevant documents: relevant documents present and verified  Test results: test results available and properly labeled Site marked: the operative site was marked Imaging studies: imaging studies available  Required items: required blood products, implants, devices, and special equipment available  Patient identity confirmed: verbally with patient and arm band  Time out: Immediately prior to procedure a "time out" was called to verify the correct patient, procedure, equipment, support staff and site/side marked as required.  Indications: Evaluate for demyelinating condition Anesthesia: local infiltration Local anesthetic: lidocaine 1% without epinephrine Anesthetic total: 3 ml Patient sedated: 1 mg Ativan Preparation: Patient was prepped and draped in the usual sterile fashion. Lumbar space: L3-L4 interspace Patient's position: Right lateral decubitus Needle gauge: 20 Needle length: 3.5 in Number of attempts: 1 Opening  pressure: 20.5 cm H2O Fluid appearance: Xanthochromic Tubes of fluid: 4 Total volume: 12 ml Post-procedure: site cleaned and adhesive bandage applied Patient tolerance: Patient tolerated the procedure well with no immediate complications    Minna Antis, MD 04/19/23 1003

## 2023-04-19 NOTE — Progress Notes (Signed)
LP done by EDP - appreciate care of the patient. Yellowish xanthochromic appearing fluid. Pt has leukocytosis. CSF analysis pending. Adding more testing to CSF incl gram stain, cytology. Also will recommend empiric meningitic/encephalitis coverage.   Milon Dikes, MD

## 2023-04-19 NOTE — Progress Notes (Addendum)
Pharmacy Antibiotic Note  Becky Shaw is a 53 y.o. female admitted on 04/19/2023 with numbness and tingling of fingertip, toes, and tongue. Pharmacy has been consulted by neurology for dosing for meningitis and encephalitis.   Plan: Per Dr. Wilford Corner with Neurology- no ampicillin.   Start Acyclovir 10mg /kg (630mg ) every 8 hours   Start ceftriaxone 2g IV every 12 hours  Start Vancomycin:  Load with 1500mg , then begin vancomycin 1250mg  IV every 24 hours.     Goal AUC 400-550.    Expected AUC: 468     SCr used: 0.8   Height: 5\' 1"  (154.9 cm) Weight: 63 kg (138 lb 14.2 oz) IBW/kg (Calculated) : 47.8  Temp (24hrs), Avg:97.8 F (36.6 C), Min:97.7 F (36.5 C), Max:97.9 F (36.6 C)  Recent Labs  Lab 04/14/23 0953 04/15/23 1103 04/16/23 1228 04/19/23 0014  WBC 7.2 6.3  --  15.3*  CREATININE 0.58 0.57 0.53 0.55    Estimated Creatinine Clearance: 70.8 mL/min (by C-G formula based on SCr of 0.55 mg/dL).    Allergies  Allergen Reactions   Cetirizine Hcl     REACTION: insomnia    Antimicrobials this admission: 1/25 Vancomycin  >>  1/25 Ceftriaxone  >>  1/25 Acyclovir>>   Microbiology results: CSF Cx: pending   Thank you for allowing pharmacy to be a part of this patient's care.  Gardner Candle, PharmD, BCPS Clinical Pharmacist 04/19/2023 11:24 AM

## 2023-04-19 NOTE — ED Provider Notes (Addendum)
[facility] Provider Note    Event Date/Time   First MD Initiated Contact with Patient 04/19/23 [phone]     (approximate)   History   Numbness (Since Friday last week)   HPI Becky Shaw is a 53 y.o. female who presents for evaluation of worsening numbness over multiple discontiguous parts of her body as well as has new onset of tongue swelling which has resolved.  She has been dealing with the numbness issue for about a week.  She has been seen twice in the [facility] emergency department, once initially in an urgent care, and has seen her primary care doctor once or twice.  She has had some persistent hyperglycemia and there was discussion of starting her on metformin but the patient did not think that her primary care doctor had written it yet.  She has been referred to neurology and has an appointment in a little over a week as an outpatient.  However she explains that she was told that if her symptoms get worse she should return to the emergency department.  She states tonight that earlier this evening her tongue swelled up.  She and her husband both describe that there was a "bubble" on the sides and she fell like it was hard for her to swallow.  The tongue swelling has since resolved but she still feels some numbness in the tongue.  She is used to having some numbness in her face and that is persistent, but she also reports that she has some numbness around her midsection and around the side of her back.  She continues to have numbness in her toes as well.  She denies having any weakness previously but now states that she feels like she needs to hold onto something when she walks around.  She said that she also feels constipated.  She has not had any vomiting. No fever, no neck pain nor stiffness.  Intermittent body aches.     Physical Exam   Triage Vital Signs: ED Triage Vitals  Encounter Vitals Group     BP 04/19/23 0010 (!) 120/98     Systolic BP  Percentile --      Diastolic BP Percentile --      Pulse Rate 04/19/23 0010 (!) 110     Resp 04/19/23 0010 16     Temp 04/19/23 0010 97.7 F (36.5 C)     Temp Source 04/19/23 0010 Oral     SpO2 04/19/23 0010 98 %     Weight 04/19/23 0011 63 kg (138 lb 14.2 oz)     Height 04/19/23 0011 1.549 m (5\' 1" )     Head Circumference --      Peak Flow --      Pain Score 04/19/23 0010 0     Pain Loc --      Pain Education --      Exclude from Growth Chart --     Most recent vital signs: Vitals:   04/19/23 0645 04/19/23 0700  BP:  (!) 149/85  Pulse: 89 78  Resp: 18 18  Temp:    SpO2: 100% 100%    General: Awake, appears ill and somewhat uncomfortable, but non-toxic. CV:  Good peripheral perfusion.  Regular rate and rhythm. Resp:  Normal effort. Speaking easily and comfortably, no accessory muscle usage nor intercostal retractions.   Abd:  No distention.  No tenderness to palpation of the abdomen. HEENT: No tongue swelling or tongue fasciculations visible at this time.  No difficulty tolerating secretions.   neuro:  No facial nerve deficits.  Patient reports subjective numbness to anterior abdomen, feet, and face.  She reports feeling generally weak but has equal grip and upper extremity strength, as well as normal major muscle group strength in lower legs.  She has 2+ patellar reflexes and is able to dorsiflex and plantarflex her feet with a normal degree of strength.   ED Results / Procedures / Treatments   Labs (all labs ordered are listed, but only abnormal results are displayed) Labs Reviewed  BASIC METABOLIC PANEL - Abnormal; Notable for the following components:      Result Value   Sodium 125 (*)    Potassium 2.8 (*)    Chloride 86 (*)    Glucose, Bld 219 (*)    All other components within normal limits  CBC - Abnormal; Notable for the following components:   WBC 15.3 (*)    RBC 5.71 (*)    Hemoglobin 17.0 (*)    HCT 47.2 (*)    All other components within normal limits   MAGNESIUM - Abnormal; Notable for the following components:   Magnesium 2.6 (*)    All other components within normal limits  POC URINE PREG, ED  TROPONIN I (HIGH SENSITIVITY)  TROPONIN I (HIGH SENSITIVITY)     EKG  ED ECG REPORT I, Loleta Rose, the attending physician, personally viewed and interpreted this ECG.  Date: 04/19/2023 EKG Time: 00: 12 Rate: 102 Rhythm: Sinus tachycardia QRS Axis: normal Intervals: normal ST/T Wave abnormalities: Non-specific ST segment / T-wave changes, but no clear evidence of acute ischemia. Narrative Interpretation: no definitive evidence of acute ischemia; does not meet STEMI criteria.    PROCEDURES:  Critical Care performed: No  Procedures    IMPRESSION / MDM / ASSESSMENT AND PLAN / ED COURSE  I reviewed the triage vital signs and the nursing notes.                              Differential diagnosis includes, but is not limited to, electrolyte or metabolic abnormality, Guillain-Barr syndrome, spinal cord lesions, viral myositis, acute flaccid myelitis, medication or drug side effect, hyperglycemia, infectious disease (meningitis or encephalitis).  Patient's presentation is most consistent with acute presentation with potential threat to life or bodily function.  Labs/studies ordered: Urine pregnancy test, magnesium level, high-sensitivity troponin x 2, basic metabolic panel, CBC, two-view chest x-ray, EKG  Interventions/Medications given:  Medications  potassium chloride 10 mEq in 100 mL IVPB (has no administration in time range)  sodium chloride 0.9 % bolus 1,000 mL (1,000 mLs Intravenous New Bag/Given 04/19/23 0702)  potassium chloride SA (KLOR-CON M) CR tablet 40 mEq (40 mEq Oral Given 04/19/23 0702)    (Note:  hospital course my include additional interventions and/or labs/studies not listed above.)   I reviewed the medical record extensively including her recent ED visits to [facility] and her primary care note.  Her  primary care doctor note from Dr. Sharen Hones from clinic clearly indicates that he intended to start her on metformin and I see the prescription, but she was unaware of this and has not yet begun treatment.  She has been trying the prescribed Flexeril but it does not seem to be making a difference.  Dr. Sharen Hones mentioned her high cholesterol levels and her persistently elevated glucose level as being possibilities.  The patient had an MRI of the brain and cervical spine when  she was at [facility] it was generally reassuring, with remote lacunar infarction but nothing acute.  Neurology was consulted by phone but did not perform an in person evaluation.  Reportedly the patient has had some travel overseas but had no infectious exposures of which anyone is aware and she has been afebrile.  Tonight is reportedly the first time that she is reporting some weakness or difficulty with ambulation, but the symptoms are primarily the paresthesias, and on physical/neuroexam, she seems to still have good and appropriate strength in her legs and normal patellar reflexes.  Her workup tonight is notable for volume depletion/dehydration with a sodium level that has dropped from already a bit low to 125 (even corrected for hyperglycemia, it only corrects to about 128).  She has a potassium level of 2.8 which is lower than it has been as well.  Her magnesium level is 2.6 and her hemoglobin is 17 and her white blood cell count is 15.3; all of these could be indicative of volume depletion, and the patient admits that she has not been eating or drinking very much.  Her symptoms are unclear at this time and her neurological exam is somewhat inconsistent or at least not consistent with a particular syndrome of which I am aware.  At this point, she is on her third emergency department visit and is also had PCP visits.    Given her worsening weakness and downtrending sodium (now 125) which is likely contributing heavily to her  generalized weakness, I feel it is very reasonable and appropriate to admit her to the hospitalist service.  She has a leukocytosis but no clear source of infection and I do not see in the record where she has been on steroids recently but I will not start empiric antibiotics without a source.  I am consulting the hospitalist for admission and also she needs in-person neurology consult which can happen during this hospitalization.      FINAL CLINICAL IMPRESSION(S) / ED DIAGNOSES   Final diagnoses:  Paresthesias  Hyponatremia  Hypokalemia  Dehydration     Rx / DC Orders   ED Discharge Orders     None        Note:  This document was prepared using Dragon voice recognition software and may include unintentional dictation errors.   Loleta Rose, MD 04/19/23 1191    Loleta Rose, MD 04/19/23 [id number]

## 2023-04-19 NOTE — ED Triage Notes (Addendum)
Pt to ed from home via POV for numbness and tingling all over her body. Pt has been seen at [facility] and [place] for same and they told her to follow up with neurology. Pt is caox4, In no acute distress in triage. Pt has an apt scheduled with neuro on 2/2 and has had an MRI and shows she has bulging discs.

## 2023-04-19 NOTE — Progress Notes (Signed)
SPONTANEOUS BREATHING MEASUREMENTS; NIF  -23 cm FVC  1.66L  53% Pred

## 2023-04-20 ENCOUNTER — Inpatient Hospital Stay: Payer: 59

## 2023-04-20 DIAGNOSIS — E119 Type 2 diabetes mellitus without complications: Secondary | ICD-10-CM

## 2023-04-20 DIAGNOSIS — G610 Guillain-Barre syndrome: Secondary | ICD-10-CM | POA: Diagnosis not present

## 2023-04-20 DIAGNOSIS — E871 Hypo-osmolality and hyponatremia: Secondary | ICD-10-CM | POA: Diagnosis not present

## 2023-04-20 DIAGNOSIS — E876 Hypokalemia: Secondary | ICD-10-CM | POA: Diagnosis not present

## 2023-04-20 LAB — URINALYSIS, COMPLETE (UACMP) WITH MICROSCOPIC
Bacteria, UA: NONE SEEN
Bilirubin Urine: NEGATIVE
Glucose, UA: >=500 mg/dL — AB
Hgb urine dipstick: NEGATIVE
Ketones, ur: 5 mg/dL — AB
Leukocytes,Ua: NEGATIVE
Nitrite: NEGATIVE
Protein, ur: 30 mg/dL — AB
Specific Gravity, Urine: 1.013 (ref 1.005–1.030)
pH: 7 (ref 5.0–8.0)

## 2023-04-20 LAB — CBC
HCT: 45.6 % (ref 36.0–46.0)
Hemoglobin: 16.6 g/dL — ABNORMAL HIGH (ref 12.0–15.0)
MCH: 30 pg (ref 26.0–34.0)
MCHC: 36.4 g/dL — ABNORMAL HIGH (ref 30.0–36.0)
MCV: 82.5 fL (ref 80.0–100.0)
Platelets: 250 10*3/uL (ref 150–400)
RBC: 5.53 MIL/uL — ABNORMAL HIGH (ref 3.87–5.11)
RDW: 11.5 % (ref 11.5–15.5)
WBC: 14.1 10*3/uL — ABNORMAL HIGH (ref 4.0–10.5)
nRBC: 0 % (ref 0.0–0.2)

## 2023-04-20 MED ORDER — GADOBUTROL 1 MMOL/ML IV SOLN
6.0000 mL | Freq: Once | INTRAVENOUS | Status: AC | PRN
  Administered 2023-04-20: 6 mL via INTRAVENOUS

## 2023-04-20 MED ORDER — IMMUNE GLOBULIN (HUMAN) 10 GM/100ML IV SOLN
400.0000 mg/kg | INTRAVENOUS | Status: AC
  Administered 2023-04-20: 20 g via INTRAVENOUS
  Administered 2023-04-21: 20 mg via INTRAVENOUS
  Administered 2023-04-22 – 2023-04-24 (×3): 20 g via INTRAVENOUS
  Filled 2023-04-20 (×5): qty 200

## 2023-04-20 MED ORDER — BISACODYL 5 MG PO TBEC
10.0000 mg | DELAYED_RELEASE_TABLET | Freq: Every day | ORAL | Status: DC
  Administered 2023-04-20 – 2023-04-25 (×4): 10 mg via ORAL
  Filled 2023-04-20 (×4): qty 2

## 2023-04-20 MED ORDER — POLYETHYLENE GLYCOL 3350 17 G PO PACK
17.0000 g | PACK | Freq: Every day | ORAL | Status: DC
  Administered 2023-04-20 – 2023-04-25 (×3): 17 g via ORAL
  Filled 2023-04-20 (×4): qty 1

## 2023-04-20 NOTE — Progress Notes (Signed)
Reached out to on call provider at the request of the patient for a dulcolax suppository order. Patient reports feeling bloated. Abdomen is noted to be taut and distended. Patient took Miralax and PO dulcolax this afternoon with no relief.   On-call provider deferred to attending. Nurse will pass along to oncoming nurse.  Of note, nurse attempted to give warm prune juice however, patient not able to tolerate.

## 2023-04-20 NOTE — Progress Notes (Signed)
Patient was educated on fall safety including calling when needing to use the restroom and wearing non-skid socks. Patient at this time is refusing to wear non-skid socks.  Patient is calling to use the restroom only when connected to the IV pump. Patient will use her son for help at other times.  Patient was educated that given she still experiencing numbness in her bilateral lower extremities that she needs to call for assistance.  Patient refusing. Will continue to monitor.    Bed in the lowest position and patient will use the front wheel walker.

## 2023-04-20 NOTE — Progress Notes (Addendum)
NEUROLOGY CONSULT FOLLOW UP NOTE   Date of service: April 20, 2023 Patient Name: Becky Shaw MRN:  [id number] DOB:  1970-07-11  Interval Hx/subjective  No acute changes Spinal tap completed  Vitals   Vitals:   04/19/23 0941 04/19/23 1501 04/19/23 2301 04/20/23 0819  BP: (!) 169/93 (!) 152/88 (!) 161/94 (!) 141/92  Pulse: 86 78 90 95  Resp: 18 18 17 16   Temp: 97.9 F (36.6 C) (!) 97.3 F (36.3 C) 98.1 F (36.7 C) 97.8 F (36.6 C)  TempSrc:      SpO2: 100% 100% 100% 98%  Weight:      Height:         Body mass index is 26.24 kg/m.  Physical Exam   General: Awake alert in no distress HEENT: Normocephalic atraumatic Lungs are clear Cardiovascular: Regular Neurological exam Awake alert oriented x 3 No dysarthria or aphasia. Cranial nerves II to XII intact. Motor examination with symmetric 4+/5 strength in all 4 extremities. Sensation is diminished in the legs in a stocking pattern and also has some glove like distribution of sensory diminishment in the upper extremities.  This is to both light touch and vibratory sensation. Coordination: No dysmetria DTRs: 2+ all over with downgoing toes. Gait: Walking with a walker  Medications  Current Facility-Administered Medications:    acetaminophen (TYLENOL) tablet 650 mg, 650 mg, Oral, Q6H PRN, Mikey College T, MD   amLODipine (NORVASC) tablet 2.5 mg, 2.5 mg, Oral, Daily, Chipper Herb, Ping T, MD, 2.5 mg at 04/20/23 4098   aspirin EC tablet 81 mg, 81 mg, Oral, Daily, Mikey College T, MD, 81 mg at 04/20/23 0905   [START ON 04/21/2023] atorvastatin (LIPITOR) tablet 10 mg, 10 mg, Oral, Once per day on Monday Thursday, Zhang, Ping T, MD   bisacodyl (DULCOLAX) EC tablet 10 mg, 10 mg, Oral, Daily, Enedina Finner, MD   cyclobenzaprine (FLEXERIL) tablet 5-10 mg, 5-10 mg, Oral, BID PRN, Mikey College T, MD, 10 mg at 04/20/23 0547   enoxaparin (LOVENOX) injection 40 mg, 40 mg, Subcutaneous, Q24H, Mikey College T, MD, 40 mg at 04/20/23 1191   Immune  Globulin 10% (PRIVIGEN) IV infusion 20 g, 400 mg/kg (Ideal), Intravenous, Q24 Hr x 5, Milon Dikes, MD   metFORMIN (GLUCOPHAGE-XR) 24 hr tablet 500 mg, 500 mg, Oral, Q breakfast, Mikey College T, MD, 500 mg at 04/20/23 0906   ondansetron (ZOFRAN) injection 4 mg, 4 mg, Intravenous, Q6H PRN, Mikey College T, MD   oxyCODONE (Oxy IR/ROXICODONE) immediate release tablet 5 mg, 5 mg, Oral, Q6H PRN, Mikey College T, MD, 5 mg at 04/20/23 0344   polyethylene glycol (MIRALAX / GLYCOLAX) packet 17 g, 17 g, Oral, Daily, Enedina Finner, MD  Labs and Diagnostic Imaging   CBC:  Recent Labs  Lab 04/15/23 1103 04/19/23 0014 04/20/23 0419  WBC 6.3 15.3* 14.1*  NEUTROABS 5.1  --   --   HGB 15.9* 17.0* 16.6*  HCT 46.5* 47.2* 45.6  MCV 88.9 82.7 82.5  PLT 187 278 250    Basic Metabolic Panel:  Lab Results  Component Value Date   NA 128 (L) 04/19/2023   K 3.5 04/19/2023   CO2 22 04/19/2023   GLUCOSE 213 (H) 04/19/2023   BUN 10 04/19/2023   CREATININE 0.40 (L) 04/19/2023   CALCIUM 8.8 (L) 04/19/2023   GFRNONAA >60 04/19/2023   GFRAA >60 03/12/2015   Lipid Panel:  Lab Results  Component Value Date   LDLCALC 115 (H) 04/16/2023   HgbA1c:  Lab Results  Component Value Date   HGBA1C 7.1 (A) 02/10/2023   MR C-spine (personally reviewed): No acute findings.  Normal appearance of the cervical spinal cord.  Mild noncompressive disc bulging at C2-3 through C6-7 without significant stenosis or neural impingement   MRI Brain(Personally reviewed): No acute findings.  Subcentimeter remote lacunar infarct in the posterior aspect of the right lentiform nucleus   B12, TSH normal.  A1c 7.1.  CSF: Color yellow, lab reported xanthochromia.  I did see the CSF photo sent by Dr. Coletta Memos is in the chart under the media tab, under photo section-looks yellow Glucose 144 Protein greater than 600 RBC tube 1 and 4: 386, 19 WBC tube 1 and 4: 9 and 9, normal distribution of lymphocytes and PMNs.   Assessment    Becky Shaw is a 53 y.o. female past history of diabetes, hypertension, hyperlipidemia presenting with about 7 days worth of paresthesias-tingling numbness in the fingers and toes along with heaviness in her tongue and dysphagia that has progressed to the point where she is now having difficult walking and has started affected her p.o. intake. Her examination reveals a glove and stocking type of sensory loss.  Intact DTRs. Brain and C-spine MRI unrevealing.  Initial lab work with hyponatremia that has progressed over the last few days.  Spinal tap revealed protein greater than 600, 9 WBCs with only 19 RBCs-that is definitely a albumin cytological dissociation. Oligoclonal bands and IgG index is pending. Meningitis encephalitis panel negative No fevers.  Patient nontoxic looking.  No meningismus.  CSF findings are likely related to the high protein count including the color.  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  Will treat with IVIG  Recommendations  DC antibiotic/antiviral coverage Start IVIG Therapy assessments Monitoring and management of electrolytes per primary team. Aspirin while on IVIG MRI lumbar spine with and without contrast Outpatient neurology follow-up with neuromuscular neurology for EMG nerve conduction study in 4 to 6 weeks. Plan discussed with Dr. Allena Katz Will follow ______________________________________________________________________   Signed, Milon Dikes, MD [facility]   ADDENDUM MR L spine with and w/o IMPRESSION: 1. Diffuse thickening and enhancement of the nerve roots of the cauda equina. This is consistent with the clinical diagnosis of Guillain-Barre syndrome. The differential diagnosis includes an acute presentation of CIDP. 2. No significant disc protrusion or stenosis in the lumbar spine.    Recs remain as above.  Milon Dikes, MD

## 2023-04-20 NOTE — Progress Notes (Addendum)
1254 Immune Globlin 10% started now infusing at 18.68ml per order. VSS prior to starting infusion. Pt educated on possible interactions and to inform nurse of any sudden SOB, itching, chest pain, dizziness, or any new symptoms. Family at bedside. Pt verbalized understanding. Nurse will remain at bedside  1310 VSS Pt denies any SOB, chills, chest pain, headache or dizziness. Remains afebrile. Reports just feeling " tired" Rate increased per MAR. Family and nurse remain at bedside.   1326 Pt tolerating, VS remain stable. Pt denies any s/s of reaction just reports feeling tired. rate increased per MAR to 120mg /kg/hr. Family and Nurse remain at bedside.   1341 Pt feels "a little hot" and "tired" but denies any other symptoms. Pt talking with family. VSS. Rate increased per [facility] last time for max dose until infusion is completed.   1356 VSS. Rate will remain 123ml/hr per MAR. Pt states she feels ok besides "my toes feel more numb then before" will make MD aware.  1400 MD aware no new orders  1456 Infusion completed. VSS. Pt reports tingling and numbness to her toes is better. IV flushed with dextrose 5%

## 2023-04-20 NOTE — Progress Notes (Signed)
OT Cancellation Note  Patient Details Name: Becky Shaw MRN: [id number] DOB: 03-19-1971   Cancelled Treatment:    Reason Eval/Treat Not Completed: Other (comment). Pt receiving IV medications requiring close monitoring. Will re-attempt OT evaluation at later date/time as appropriate.   Arman Filter., MPH, MS, OTR/L ascom [phone] 04/20/23, 1:21 PM

## 2023-04-20 NOTE — Plan of Care (Signed)
  Problem: Clinical Measurements: Goal: Ability to maintain clinical measurements within normal limits will improve Outcome: Progressing Goal: Will remain free from infection Outcome: Progressing   Problem: Education: Goal: Ability to describe self-care measures that may prevent or decrease complications (Diabetes Survival Skills Education) will improve Outcome: Progressing   Problem: Health Behavior/Discharge Planning: Goal: Ability to manage health-related needs will improve Outcome: Progressing   Problem: Nutritional: Goal: Maintenance of adequate nutrition will improve Outcome: Progressing

## 2023-04-20 NOTE — Progress Notes (Addendum)
[facility]  - [place] at [facility]   PATIENT NAME: Becky Shaw    MR#:  [id number]  DATE OF BIRTH:  04/02/1970  SUBJECTIVE:  patient's husband at bedside. Came in with tingling of her toes along with back pain and tingling of the fingers and feeling numbness in the tongue with difficulty swallowing solids. Symptoms started around January 17 per patient    VITALS:  Blood pressure (!) 158/86, pulse 87, temperature 97.9 F (36.6 C), temperature source Oral, resp. rate 16, height 5\' 1"  (1.549 m), weight 63 kg, SpO2 99%.  PHYSICAL EXAMINATION:   GENERAL:  53 y.o.-year-old patient with no acute distress.  LUNGS: Normal breath sounds bilaterally, no wheezing CARDIOVASCULAR: S1, S2 normal. No murmur   ABDOMEN: Soft, nontender, nondistended. Bowel sounds present.  EXTREMITIES: No  edema b/l.    NEUROLOGIC: nonfocal  patient is alert and awake decreased sensation in lower extremity.  LABORATORY PANEL:  CBC Recent Labs  Lab 04/20/23 0419  WBC 14.1*  HGB 16.6*  HCT 45.6  PLT 250    Chemistries  Recent Labs  Lab 04/16/23 1228 04/19/23 0014 04/19/23 0212 04/19/23 1506  NA 128*   < >  --  128*  K 3.3*   < >  --  3.5  CL 90*   < >  --  93*  CO2 30   < >  --  22  GLUCOSE 207*   < >  --  213*  BUN 9   < >  --  10  CREATININE 0.53   < >  --  0.40*  CALCIUM 10.2   < >  --  8.8*  MG  --   --  2.6*  --   AST 19  --   --   --   ALT 30  --   --   --   ALKPHOS 96  --   --   --   BILITOT 1.7*  --   --   --    < > = values in this interval not displayed.   Cardiac Enzymes No results for input(s): "TROPONINI" in the last 168 hours. RADIOLOGY:  MR Lumbar Spine W Wo Contrast Result Date: 04/20/2023 CLINICAL DATA:  Demyelinating disease. Question AIDP. Elevated CSF protein. EXAM: MRI LUMBAR SPINE WITHOUT AND WITH CONTRAST TECHNIQUE: Multiplanar and multiecho pulse sequences of the lumbar spine were obtained without and with intravenous contrast. CONTRAST:  6mL  GADAVIST GADOBUTROL 1 MMOL/ML IV SOLN COMPARISON:  MRI of the head and cervical spine 04/15/2023 FINDINGS: Segmentation: 5 non rib-bearing lumbar type vertebral bodies are present. The lowest fully formed vertebral body is L5. Alignment: No significant listhesis is present. Mild straightening of the normal lumbar lordosis is present. Vertebrae:  Marrow signal and vertebral body heights are normal. Conus medullaris and cauda equina: Conus extends to the L1-2 level. The nerve roots of the cauda equina are diffusely thickened and demonstrate profuse enhancement. No discrete mass lesion is present. Paraspinal and other soft tissues: The urinary bladder is moderately distended. No wall thickening or abnormality is present. Visualized abdomen and pelvis is otherwise within normal limits. Disc levels: No significant disc protrusion or stenosis is present in the lumbar spine. IMPRESSION: 1. Diffuse thickening and enhancement of the nerve roots of the cauda equina. This is consistent with the clinical diagnosis of Guillain-Barre syndrome. The differential diagnosis includes an acute presentation of CIDP. 2. No significant disc protrusion or stenosis in the lumbar spine. These results were  called by telephone at the time of interpretation on 04/20/2023 at 11:29 am to provider [facility] , who verbally acknowledged these results. Electronically Signed   By: Marin Roberts M.D.   On: 04/20/2023 11:30   DG Lumbar Spine 2-3 Views Result Date: 04/19/2023 CLINICAL DATA:  Low back pain. EXAM: LUMBAR SPINE - 2-3 VIEW COMPARISON:  None Available. FINDINGS: There is no evidence of lumbar spine fracture. Alignment is normal. Intervertebral disc spaces are maintained. Mild degenerative osteophytosis seen at L2-3 and L3-4. IMPRESSION: No acute findings. Mild degenerative osteophytosis at L2-3 and L3-4, without disc space narrowing. Electronically Signed   By: Danae Orleans M.D.   On: 04/19/2023 10:07   DG Chest 2 View Result  Date: 04/19/2023 CLINICAL DATA:  Numbness EXAM: CHEST - 2 VIEW COMPARISON:  Chest x-ray 04/15/2023 FINDINGS: The heart size and mediastinal contours are within normal limits. Both lungs are clear. The visualized skeletal structures are unremarkable. IMPRESSION: No active cardiopulmonary disease. Electronically Signed   By: Darliss Cheney M.D.   On: 04/19/2023 00:36    Assessment and Plan Becky Shaw is a 53 y.o. female with medical history significant of HTN, newly diagnosed diabetes presented with numbness tingling of toes, fingertips and tongue   Started 5 days ago, patient started develop bilateral toes tingling sensations, some extended to bilateral feet bilateral hands fingertips and lips and changes denied any weakness at that point.  Went to [facility] ED on 1/21, brain MRI showed no acute findings but remote lacunar stroke and patient discharged home and referred to neurology.  Demyelinating disease/Guillain-Barr syndrome -- patient came in with tingling numbness lower extremity went up to her fingers along with numbness of her tongue and some difficulty swallowing solids. -- LP showed elevated protein >600, Xanthochromia  -- MRI lumbar spine shows diffuse inflammation/thickening of nerves in the lumbar spine consistent with demyelinating process -- empiric antibiotics discontinued. --Patient started on IV IG infusions per Dr. Wilford Corner -- EMG as outpatient and 4 to 6 weeks  Hyponatremia/hypokalemia -- IV fluids, replace potassium  Type II diabetes with hyperglycemia new onset -- sliding scale insulin -- resume metformin at discharge  Hypertension  --continue amlodipine  Procedures: LP Family communication : has Consults : neuro- CODE STATUS: full  DVT Prophylaxis : Lovenox Level of care: Telemetry Medical Status is: Inpatient Remains inpatient appropriate because: GB syndrome patient requires IV IG    TOTAL TIME TAKING CARE OF THIS PATIENT: 45 minutes.  >50%  time spent on counselling and coordination of care  Note: This dictation was prepared with Dragon dictation along with smaller phrase technology. Any transcriptional errors that result from this process are unintentional.  Enedina Finner M.D    [facility]   CC: Primary care physician; Eustaquio Boyden, MD

## 2023-04-20 NOTE — Plan of Care (Signed)
Problem: Education: Goal: Knowledge of General Education information will improve Description: Including pain rating scale, medication(s)/side effects and non-pharmacologic comfort measures Outcome: Progressing   Problem: Clinical Measurements: Goal: Diagnostic test results will improve Outcome: Progressing   Problem: Safety: Goal: Ability to remain free from injury will improve Outcome: Progressing

## 2023-04-20 NOTE — Progress Notes (Signed)
Spontaneous Breathing Parameters:  Vital Capacity--> 1.73 Liters NIF-->-30

## 2023-04-20 NOTE — Progress Notes (Signed)
Breathing parameters. Poor effort pt stated she was tired and had taken a muscle relaxer  NIF -23 FVC 1.50

## 2023-04-20 NOTE — Evaluation (Signed)
Physical Therapy Evaluation Patient Details Name: Becky Shaw MRN: [id number] DOB: [birth year] Today's Date: 04/20/2023  History of Present Illness  Patient is a 53 year old with several day history of progressive paresthesias, tingling numbness in fingers and toes along with heaviness in her tongue, dysphagia. Difficulty walking. Lumbar puncture with CSF analysis pending.  Clinical Impression  Patient is agreeable to PT evaluation. Supportive spouse at the bedside. Patient is typically independent at baseline.  Today the patient has 4+/5 strength in all extremities. She has continued paraesthesias in both arms and legs in glove and stocking distributions. The patient was able to stand and ambulate in the room with CGA using rolling walker. Gait pattern improved with reciprocal steps with increased ambulation distance. Activity tolerance limited by fatigue. Recommend PT follow up to maximize independence and decrease caregiver burden. At this time a rolling walker is recommended but family is hopeful she will not require a walker once discharged. She will have family support as well at home.       If plan is discharge home, recommend the following: A little help with walking and/or transfers;A little help with bathing/dressing/bathroom;Assistance with cooking/housework;Assist for transportation;Help with stairs or ramp for entrance   Can travel by private vehicle        Equipment Recommendations Rolling walker (2 wheels)  Recommendations for Other Services       Functional Status Assessment Patient has had a recent decline in their functional status and demonstrates the ability to make significant improvements in function in a reasonable and predictable amount of time.     Precautions / Restrictions Precautions Precautions: Fall Restrictions Weight Bearing Restrictions Per Provider Order: No      Mobility  Bed Mobility Overal bed mobility: Needs Assistance Bed Mobility: Supine to  Sit, Sit to Supine     Supine to sit: Min assist Sit to supine: Min assist   General bed mobility comments: assistance for trunk to sit upright. assistance for LE support to return to bed    Transfers Overall transfer level: Needs assistance Equipment used: Rolling walker (2 wheels) Transfers: Sit to/from Stand Sit to Stand: Contact guard assist           General transfer comment: cues for hand placement for safety    Ambulation/Gait Ambulation/Gait assistance: Contact guard assist Gait Distance (Feet): 30 Feet Assistive device: Rolling walker (2 wheels) Gait Pattern/deviations: Step-through pattern, Decreased stride length Gait velocity: decreased     General Gait Details: CGA provided for safety. heavy reliance on rolling walker for support. gait pattern did improve with reciprocal steps with increased ambulation distance  Stairs            Wheelchair Mobility     Tilt Bed    Modified Rankin (Stroke Patients Only)       Balance Overall balance assessment: Needs assistance Sitting-balance support: Feet supported Sitting balance-Leahy Scale: Fair     Standing balance support: Single extremity supported, During functional activity Standing balance-Leahy Scale: Poor Standing balance comment: relying heavily on rolling walker                             Pertinent Vitals/Pain Pain Assessment Pain Assessment: Faces Faces Pain Scale: Hurts little more Pain Location: lower back Pain Descriptors / Indicators: Discomfort Pain Intervention(s): Limited activity within patient's tolerance, Monitored during session, Repositioned    Home Living Family/patient expects to be discharged to:: Private residence Living Arrangements: Spouse/significant other Available Help  at Discharge: Family;Available 24 hours/day Type of Home: House Home Access: Stairs to enter   [employer] of Steps: 2   Home Layout: One level        Prior  Function Prior Level of Function : Independent/Modified Independent;Working/employed                     Extremity/Trunk Assessment   Upper Extremity Assessment Upper Extremity Assessment: Right hand dominant;RUE deficits/detail;LUE deficits/detail RUE Deficits / Details: 4+/5 throughout RUE Sensation: decreased light touch (glove type of sensory loss from elbow distally) LUE Deficits / Details: 4+/5 throughout LUE Sensation: decreased light touch (glove type of sensory loss from elbow distally)    Lower Extremity Assessment Lower Extremity Assessment: RLE deficits/detail;LLE deficits/detail RLE Deficits / Details: 4+/5 throughout RLE Sensation: decreased light touch (stocking type of sensory loss from knee distally) LLE Deficits / Details: 4+/5 throughout LLE Sensation: decreased light touch (stocking type of sensory loss of just the foot)       Communication   Communication Communication: No apparent difficulties  Cognition Arousal: Alert Behavior During Therapy: WFL for tasks assessed/performed Overall Cognitive Status: Within Functional Limits for tasks assessed                                          General Comments      Exercises     Assessment/Plan    PT Assessment Patient needs continued PT services  PT Problem List Decreased strength;Decreased range of motion;Decreased activity tolerance;Decreased mobility;Decreased balance;Decreased safety awareness;Decreased knowledge of precautions       PT Treatment Interventions DME instruction;Gait training;Functional mobility training;Stair training;Therapeutic activities;Therapeutic exercise;Balance training;Neuromuscular re-education;Cognitive remediation;Patient/family education    PT Goals (Current goals can be found in the Care Plan section)  Acute Rehab PT Goals Patient Stated Goal: to get better PT Goal Formulation: With patient Time For Goal Achievement: 05/04/23 Potential to  Achieve Goals: Good    Frequency Min 1X/week     Co-evaluation               AM-PAC PT "6 Clicks" Mobility  Outcome Measure Help needed turning from your back to your side while in a flat bed without using bedrails?: None Help needed moving from lying on your back to sitting on the side of a flat bed without using bedrails?: A Little Help needed moving to and from a bed to a chair (including a wheelchair)?: A Little Help needed standing up from a chair using your arms (e.g., wheelchair or bedside chair)?: A Little Help needed to walk in hospital room?: A Little Help needed climbing 3-5 steps with a railing? : A Little 6 Click Score: 19    End of Session   Activity Tolerance: Patient tolerated treatment well Patient left: in bed;with call bell/phone within reach;with family/visitor present   PT Visit Diagnosis: Unsteadiness on feet (R26.81);Muscle weakness (generalized) (M62.81)    Time: 1610-9604 PT Time Calculation (min) (ACUTE ONLY): 11 min   Charges:   PT Evaluation $PT Eval Low Complexity: 1 Low   PT General Charges $$ ACUTE PT VISIT: 1 Visit         Donna Bernard, PT, MPT   Ina Homes 04/20/2023, 1:12 PM

## 2023-04-21 DIAGNOSIS — G610 Guillain-Barre syndrome: Secondary | ICD-10-CM | POA: Diagnosis not present

## 2023-04-21 DIAGNOSIS — E871 Hypo-osmolality and hyponatremia: Secondary | ICD-10-CM | POA: Diagnosis not present

## 2023-04-21 DIAGNOSIS — E119 Type 2 diabetes mellitus without complications: Secondary | ICD-10-CM | POA: Diagnosis not present

## 2023-04-21 DIAGNOSIS — E876 Hypokalemia: Secondary | ICD-10-CM | POA: Diagnosis not present

## 2023-04-21 LAB — BASIC METABOLIC PANEL
Anion gap: 15 (ref 5–15)
BUN: 21 mg/dL — ABNORMAL HIGH (ref 6–20)
CO2: 23 mmol/L (ref 22–32)
Calcium: 9.3 mg/dL (ref 8.9–10.3)
Chloride: 90 mmol/L — ABNORMAL LOW (ref 98–111)
Creatinine, Ser: 0.63 mg/dL (ref 0.44–1.00)
GFR, Estimated: >60 mL/min (ref >60)
Glucose, Bld: 191 mg/dL — ABNORMAL HIGH (ref 70–99)
Potassium: 3.8 mmol/L (ref 3.5–5.1)
Sodium: 128 mmol/L — ABNORMAL LOW (ref 135–145)

## 2023-04-21 LAB — IGG CSF INDEX
Albumin CSF-mCnc: >333 mg/dL — ABNORMAL HIGH (ref 8–37)
Albumin: 4.7 g/dL (ref 3.8–4.9)
CSF IgG Index: UNDETERMINED
IgG (Immunoglobin G), Serum: 1591 mg/dL (ref 586–1602)
IgG, CSF: >135 mg/dL — ABNORMAL HIGH (ref 0.0–6.7)
IgG/Alb Ratio, CSF: UNDETERMINED

## 2023-04-21 LAB — GLUCOSE, CAPILLARY
Glucose-Capillary: 209 mg/dL — ABNORMAL HIGH (ref 70–99)
Glucose-Capillary: 181 mg/dL — ABNORMAL HIGH (ref 70–99)
Glucose-Capillary: 131 mg/dL — ABNORMAL HIGH (ref 70–99)
Glucose-Capillary: 211 mg/dL — ABNORMAL HIGH (ref 70–99)

## 2023-04-21 MED ORDER — METFORMIN HCL ER 500 MG PO TB24
500.0000 mg | ORAL_TABLET | Freq: Two times a day (BID) | ORAL | Status: DC
  Administered 2023-04-21 – 2023-04-25 (×8): 500 mg via ORAL
  Filled 2023-04-21 (×10): qty 1

## 2023-04-21 MED ORDER — INSULIN ASPART 100 UNIT/ML IJ SOLN
0.0000 [IU] | Freq: Every day | INTRAMUSCULAR | Status: DC
  Administered 2023-04-21: 2 [IU] via SUBCUTANEOUS
  Filled 2023-04-21: qty 1

## 2023-04-21 MED ORDER — INSULIN ASPART 100 UNIT/ML IJ SOLN
0.0000 [IU] | Freq: Three times a day (TID) | INTRAMUSCULAR | Status: DC
  Administered 2023-04-21: 2 [IU] via SUBCUTANEOUS
  Administered 2023-04-21: 1 [IU] via SUBCUTANEOUS
  Administered 2023-04-22 – 2023-04-23 (×3): 2 [IU] via SUBCUTANEOUS
  Administered 2023-04-23: 1 [IU] via SUBCUTANEOUS
  Administered 2023-04-24: 3 [IU] via SUBCUTANEOUS
  Administered 2023-04-24: 2 [IU] via SUBCUTANEOUS
  Administered 2023-04-24: 1 [IU] via SUBCUTANEOUS
  Filled 2023-04-21 (×9): qty 1

## 2023-04-21 NOTE — Progress Notes (Signed)
[facility]  - [place] at [facility]   PATIENT NAME: Becky Shaw    MR#:  [id number]  DATE OF BIRTH:  04-Nov-1970  SUBJECTIVE:  patient seems to be stable at present. Her tingling numbness is stable. Receiving IV IG. Ambulating to and from in the room with walker. Trying to swallow slowly. No choking spell.  VITALS:  Blood pressure 125/74, pulse 90, temperature 98 F (36.7 C), temperature source Oral, resp. rate 18, height 5\' 1"  (1.549 m), weight 63 kg, SpO2 100%.  PHYSICAL EXAMINATION:   GENERAL:  53 y.o.-year-old patient with no acute distress.  LUNGS: Normal breath sounds bilaterally, no wheezing CARDIOVASCULAR: S1, S2 normal. No murmur   ABDOMEN: Soft, nontender, nondistended. Bowel sounds present.  EXTREMITIES: No  edema b/l.    NEUROLOGIC: nonfocal  patient is alert and awake decreased sensation in lower extremity.  LABORATORY PANEL:  CBC Recent Labs  Lab 04/20/23 0419  WBC 14.1*  HGB 16.6*  HCT 45.6  PLT 250    Chemistries  Recent Labs  Lab 04/16/23 1228 04/19/23 0014 04/19/23 0212 04/19/23 1506 04/21/23 1255  NA 128*   < >  --    < > 128*  K 3.3*   < >  --    < > 3.8  CL 90*   < >  --    < > 90*  CO2 30   < >  --    < > 23  GLUCOSE 207*   < >  --    < > 191*  BUN 9   < >  --    < > 21*  CREATININE 0.53   < >  --    < > 0.63  CALCIUM 10.2   < >  --    < > 9.3  MG  --   --  2.6*  --   --   AST 19  --   --   --   --   ALT 30  --   --   --   --   ALKPHOS 96  --   --   --   --   BILITOT 1.7*  --   --   --   --    < > = values in this interval not displayed.   Cardiac Enzymes No results for input(s): "TROPONINI" in the last 168 hours. RADIOLOGY:  MR Lumbar Spine W Wo Contrast Result Date: 04/20/2023 CLINICAL DATA:  Demyelinating disease. Question AIDP. Elevated CSF protein. EXAM: MRI LUMBAR SPINE WITHOUT AND WITH CONTRAST TECHNIQUE: Multiplanar and multiecho pulse sequences of the lumbar spine were obtained without and with intravenous  contrast. CONTRAST:  6mL GADAVIST GADOBUTROL 1 MMOL/ML IV SOLN COMPARISON:  MRI of the head and cervical spine 04/15/2023 FINDINGS: Segmentation: 5 non rib-bearing lumbar type vertebral bodies are present. The lowest fully formed vertebral body is L5. Alignment: No significant listhesis is present. Mild straightening of the normal lumbar lordosis is present. Vertebrae:  Marrow signal and vertebral body heights are normal. Conus medullaris and cauda equina: Conus extends to the L1-2 level. The nerve roots of the cauda equina are diffusely thickened and demonstrate profuse enhancement. No discrete mass lesion is present. Paraspinal and other soft tissues: The urinary bladder is moderately distended. No wall thickening or abnormality is present. Visualized abdomen and pelvis is otherwise within normal limits. Disc levels: No significant disc protrusion or stenosis is present in the lumbar spine. IMPRESSION: 1. Diffuse thickening and enhancement of  the nerve roots of the cauda equina. This is consistent with the clinical diagnosis of Guillain-Barre syndrome. The differential diagnosis includes an acute presentation of CIDP. 2. No significant disc protrusion or stenosis in the lumbar spine. These results were called by telephone at the time of interpretation on 04/20/2023 at 11:29 am to provider [facility] , who verbally acknowledged these results. Electronically Signed   By: Marin Roberts M.D.   On: 04/20/2023 11:30    Assessment and Plan Becky Shaw is a 53 y.o. female with medical history significant of HTN, newly diagnosed diabetes presented with numbness tingling of toes, fingertips and tongue   Started 5 days ago, patient started develop bilateral toes tingling sensations, some extended to bilateral feet bilateral hands fingertips and lips and changes denied any weakness at that point.  Went to [facility] ED on 1/21, brain MRI showed no acute findings but remote lacunar stroke and patient  discharged home and referred to neurology.  Demyelinating disease/Guillain-Barr syndrome -- patient came in with tingling numbness lower extremity went up to her fingers along with numbness of her tongue and some difficulty swallowing solids. -- LP showed elevated protein >600, Xanthochromia  -- MRI lumbar spine shows diffuse inflammation/thickening of nerves in the lumbar spine consistent with demyelinating process -- empiric antibiotics discontinued. --Patient started on IV IG infusions per Dr. 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  >50% time spent on counselling and coordination of care  Note: This dictation was prepared with Dragon dictation along with smaller phrase technology. Any transcriptional errors that result from this process are unintentional.  Enedina Finner M.D    [facility]   CC: Primary care physician; Eustaquio Boyden, MD

## 2023-04-21 NOTE — TOC Progression Note (Signed)
Transition of Care [facility]) - Progression Note    Patient Details  Name: Becky Shaw MRN: [id number] Date of Birth: [birth year]  [facility] CM/SW Contact  Marlowe Sax, RN Phone Number: 04/21/2023, 9:48 AM  Clinical Narrative:    Reacehd out to [facility] ([facility]) to see if they are able to accept her for [facility]        Expected Discharge Plan and Services                                               Social Determinants of Health (SDOH) Interventions SDOH Screenings   Food Insecurity: No Food Insecurity (04/20/2023)  Housing: Patient Declined (04/20/2023)  Transportation Needs: No Transportation Needs (04/19/2023)  Utilities: Not At Risk (04/19/2023)  Depression (PHQ2-9): Low Risk  (04/16/2023)  Tobacco Use: Low Risk  (04/19/2023)    Readmission Risk Interventions     No data to display

## 2023-04-21 NOTE — Plan of Care (Signed)
  Problem: Clinical Measurements: Goal: Diagnostic test results will improve Outcome: Progressing   Problem: Activity: Goal: Risk for activity intolerance will decrease Outcome: Progressing   Problem: Pain Managment: Goal: General experience of comfort will improve and/or be controlled Outcome: Progressing   Problem: Safety: Goal: Ability to remain free from injury will improve Outcome: Progressing

## 2023-04-21 NOTE — Progress Notes (Signed)
Breathing parameters NIF -20 FVC 1.57

## 2023-04-21 NOTE — Evaluation (Signed)
Occupational Therapy Evaluation Patient Details Name: Becky Shaw MRN: [id number] DOB: 1971/03/01 Today's Date: 04/21/2023   History of Present Illness Patient is a 53 year old with several day history of progressive paresthesias, tingling numbness in fingers and toes along with heaviness in her tongue, dysphagia. Difficulty walking. Lumbar puncture with CSF analysis pending. 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   Clinical Impression   Becky Shaw was seen for OT evaluation this date. Prior to hospital admission, pt was IND. Pt lives with spouse. Pt currently requires MIN A don underwear, assist for threading over BLE 2/2 hip flexor weakness. MOD I for toilet t/f and pericare using RW. Educated on safe RW use and falls prevention. No skilled acute OT needs identified, will sign off. Upon hospital discharge, recommend Outpatient OT follow up if paresthesias persist.     If plan is discharge home, recommend the following: Assistance with cooking/housework;A little help with bathing/dressing/bathroom    Functional Status Assessment  Patient has had a recent decline in their functional status and demonstrates the ability to make significant improvements in function in a reasonable and predictable amount of time.  Equipment Recommendations  Other (comment) (RW)    Recommendations for Other Services       Precautions / Restrictions Precautions Precautions: Fall Restrictions Weight Bearing Restrictions Per Provider Order: No      Mobility Bed Mobility Overal bed mobility: Modified Independent             General bed mobility comments: HOB elevated    Transfers Overall transfer level: Modified independent Equipment used: Rolling walker (2 wheels)                      Balance Overall balance assessment: Mild deficits observed, not formally tested                                         ADL either performed or assessed with  clinical judgement   ADL Overall ADL's : Needs assistance/impaired                                       General ADL Comments: MIN A don underwear, assist for threading over BLE 2/2 hip flexor weakness. MOD I for toilet t/f and pericare using RW.      Pertinent Vitals/Pain Pain Assessment Pain Assessment: Faces Faces Pain Scale: Hurts a little bit Pain Location: lower back Pain Descriptors / Indicators: Discomfort Pain Intervention(s): Limited activity within patient's tolerance, Repositioned     Extremity/Trunk Assessment Upper Extremity Assessment Upper Extremity Assessment: RUE deficits/detail;LUE deficits/detail RUE Deficits / Details: paresthesias glove distribution to elbow, 4+/5 LUE Deficits / Details: paresthesias glove distribution to elbow, 4+/5   Lower Extremity Assessment Lower Extremity Assessment: Defer to PT evaluation       Communication Communication Communication: No apparent difficulties   Cognition Arousal: Alert Behavior During Therapy: WFL for tasks assessed/performed Overall Cognitive Status: Within Functional Limits for tasks assessed                                        Home Living Family/patient expects to be discharged to:: Private residence Living Arrangements: Spouse/significant other Available Help at Discharge: Family;Available 24 hours/day  Type of Home: House Home Access: Stairs to enter [employer] of Steps: 2   Home Layout: One level                          Prior Functioning/Environment Prior Level of Function : Independent/Modified Independent;Working/employed                        OT Problem List: Impaired sensation;Decreased strength         OT Goals(Current goals can be found in the care plan section) Acute Rehab OT Goals Patient Stated Goal: to return to PLOF OT Goal Formulation: With patient Time For Goal Achievement: 04/21/23 Potential to Achieve  Goals: Good   AM-PAC OT "6 Clicks" Daily Activity     Outcome Measure Help from another person eating meals?: None Help from another person taking care of personal grooming?: A Little Help from another person toileting, which includes using toliet, bedpan, or urinal?: None Help from another person bathing (including washing, rinsing, drying)?: A Little Help from another person to put on and taking off regular upper body clothing?: None Help from another person to put on and taking off regular lower body clothing?: A Little 6 Click Score: 21   End of Session Equipment Utilized During Treatment: Rolling walker (2 wheels)  Activity Tolerance: Patient tolerated treatment well Patient left: in bed;with call bell/phone within reach;with family/visitor present  OT Visit Diagnosis: Other abnormalities of gait and mobility (R26.89);Muscle weakness (generalized) (M62.81)                Time: 1610-9604 OT Time Calculation (min): 8 min Charges:  OT General Charges $OT Visit: 1 Visit OT Evaluation $OT Eval Low Complexity: 1 Low  Kathie Dike, M.S. OTR/L  04/21/23, 9:12 AM  ascom [phone]

## 2023-04-21 NOTE — Progress Notes (Signed)
Physical Therapy Treatment Patient Details Name: Becky Shaw MRN: [id number] DOB: 1970-04-13 Today's Date: 04/21/2023   History of Present Illness Patient is a 53 year old with several day history of progressive paresthesias, tingling numbness in fingers and toes along with heaviness in her tongue, dysphagia. Difficulty walking. Lumbar puncture with CSF analysis pending. 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    PT Comments  Pt is making good progress towards goals with ability to ambulate increased distance using RW. Grip socks donned for mobility. Pt then able to perform stair training in rehab gym. Will continue to progress. ENcouraged pt to continue mobility efforts daily with nursing staff.    If plan is discharge home, recommend the following: A little help with walking and/or transfers;A little help with bathing/dressing/bathroom;Assistance with cooking/housework;Assist for transportation;Help with stairs or ramp for entrance   Can travel by private vehicle        Equipment Recommendations  Rolling walker (2 wheels)    Recommendations for Other Services       Precautions / Restrictions Precautions Precautions: Fall Restrictions Weight Bearing Restrictions Per Provider Order: No     Mobility  Bed Mobility Overal bed mobility: Modified Independent             General bed mobility comments: safe technique    Transfers Overall transfer level: Modified independent Equipment used: Rolling walker (2 wheels) Transfers: Sit to/from Stand Sit to Stand: Supervision           General transfer comment: safe technique with no cues for hand placement. ONce standing, RW used. RN gives clearance to ambulate while on IVIG via IV pole.    Ambulation/Gait Ambulation/Gait assistance: Contact guard assist Gait Distance (Feet): 300 Feet Assistive device: Rolling walker (2 wheels) Gait Pattern/deviations: Step-through pattern, Decreased stride length        General Gait Details: cga however progressing to supervision and RW. Upright posture and reciprocal gait pattern, although slow gait.   Stairs Stairs: Yes Stairs assistance: Contact guard assist Stair Management: Two rails, Alternating pattern Number of Stairs: 8 General stair comments: up/down with safe technique with B rails.   Wheelchair Mobility     Tilt Bed    Modified Rankin (Stroke Patients Only)       Balance Overall balance assessment: Mild deficits observed, not formally tested Sitting-balance support: Feet supported Sitting balance-Leahy Scale: Fair     Standing balance support: Single extremity supported, During functional activity Standing balance-Leahy Scale: Good                              Cognition Arousal: Alert Behavior During Therapy: WFL for tasks assessed/performed Overall Cognitive Status: Within Functional Limits for tasks assessed                                 General Comments: pleasant and agreeable to session        Exercises      General Comments        Pertinent Vitals/Pain Pain Assessment Pain Assessment: No/denies pain    Home Living                          Prior Function            PT Goals (current goals can now be found in the care plan section) Acute Rehab PT Goals Patient  Stated Goal: to get better PT Goal Formulation: With patient Time For Goal Achievement: 05/04/23 Potential to Achieve Goals: Good Progress towards PT goals: Progressing toward goals    Frequency    Min 1X/week      PT Plan      Co-evaluation              AM-PAC PT "6 Clicks" Mobility   Outcome Measure  Help needed turning from your back to your side while in a flat bed without using bedrails?: None Help needed moving from lying on your back to sitting on the side of a flat bed without using bedrails?: None Help needed moving to and from a bed to a chair (including a wheelchair)?:  A Little Help needed standing up from a chair using your arms (e.g., wheelchair or bedside chair)?: A Little Help needed to walk in hospital room?: A Little Help needed climbing 3-5 steps with a railing? : A Little 6 Click Score: 20    End of Session Equipment Utilized During Treatment: Gait belt Activity Tolerance: Patient tolerated treatment well Patient left: in bed (seated on EOB) Nurse Communication: Mobility status PT Visit Diagnosis: Unsteadiness on feet (R26.81);Muscle weakness (generalized) (M62.81)     Time: 4782-9562 PT Time Calculation (min) (ACUTE ONLY): 18 min  Charges:    $Gait Training: 8-22 mins PT General Charges $$ ACUTE PT VISIT: 1 Visit                     Elizabeth Palau, PT, DPT, GCS [phone]    Dhairya Corales 04/21/2023, 1:40 PM

## 2023-04-21 NOTE — Plan of Care (Signed)

## 2023-04-21 NOTE — Progress Notes (Signed)
NEUROLOGY CONSULT FOLLOW UP NOTE   Date of service: April 21, 2023 Patient Name: Becky Shaw MRN:  [id number] DOB:  October 23, 1970  Interval Hx/subjective  Started IVIG yesterday, tolerated well  Vitals   Vitals:   04/20/23 2218 04/21/23 0900 04/21/23 0916 04/21/23 1003  BP: 139/70 122/86  122/86  Pulse: 97 (!) 107 99   Resp: 17 18    Temp: 98.3 F (36.8 C) 97.6 F (36.4 C)    TempSrc: Oral Oral    SpO2: 100% 100%    Weight:      Height:         Body mass index is 26.24 kg/m.  Physical Exam   General: Awake alert in no distress Neurological exam Awake alert oriented x 3 She appears to have a slightly decreased nasolabial fold on the right and I question mild facial weakness She possibly has mild hip flexion weakness bilaterally, again very mild and has good strength distally. I am unable to elicit a right knee reflex today, but she has good ankle reflexes, left knee reflex, bilateral biceps. She has decreased sensation to just above the knee bilaterally, as well as to the mid forearm   Medications  Current Facility-Administered Medications:    acetaminophen (TYLENOL) tablet 650 mg, 650 mg, Oral, Q6H PRN, Mikey College T, MD   amLODipine (NORVASC) tablet 2.5 mg, 2.5 mg, Oral, Daily, Chipper Herb, Ping T, MD, 2.5 mg at 04/21/23 1003   aspirin EC tablet 81 mg, 81 mg, Oral, Daily, Mikey College T, MD, 81 mg at 04/21/23 1003   atorvastatin (LIPITOR) tablet 10 mg, 10 mg, Oral, Once per day on Monday Thursday, Zhang, Ping T, MD, 10 mg at 04/21/23 5621   bisacodyl (DULCOLAX) EC tablet 10 mg, 10 mg, Oral, Daily, Enedina Finner, MD, 10 mg at 04/20/23 1239   cyclobenzaprine (FLEXERIL) tablet 5-10 mg, 5-10 mg, Oral, BID PRN, Mikey College T, MD, 10 mg at 04/20/23 2029   enoxaparin (LOVENOX) injection 40 mg, 40 mg, Subcutaneous, Q24H, Mikey College T, MD, 40 mg at 04/21/23 1003   Immune Globulin 10% (PRIVIGEN) IV infusion 20 g, 400 mg/kg (Ideal), Intravenous, Q24 Hr x 5, Milon Dikes, MD, Last  Rate: 18.9 mL/hr at 04/21/23 1005, 20 mg at 04/21/23 1005   insulin aspart (novoLOG) injection 0-5 Units, 0-5 Units, Subcutaneous, QHS, Enedina Finner, MD   insulin aspart (novoLOG) injection 0-9 Units, 0-9 Units, Subcutaneous, TID WC, Enedina Finner, MD   metFORMIN (GLUCOPHAGE-XR) 24 hr tablet 500 mg, 500 mg, Oral, Q breakfast, Chipper Herb, Ping T, MD, 500 mg at 04/21/23 0824   ondansetron (ZOFRAN) injection 4 mg, 4 mg, Intravenous, Q6H PRN, Mikey College T, MD   oxyCODONE (Oxy IR/ROXICODONE) immediate release tablet 5 mg, 5 mg, Oral, Q6H PRN, Mikey College T, MD, 5 mg at 04/20/23 0344   polyethylene glycol (MIRALAX / GLYCOLAX) packet 17 g, 17 g, Oral, Daily, Enedina Finner, MD, 17 g at 04/20/23 1239  Labs and Diagnostic Imaging   CBC:  Recent Labs  Lab 04/15/23 1103 04/19/23 0014 04/20/23 0419  WBC 6.3 15.3* 14.1*  NEUTROABS 5.1  --   --   HGB 15.9* 17.0* 16.6*  HCT 46.5* 47.2* 45.6  MCV 88.9 82.7 82.5  PLT 187 278 250    Basic Metabolic Panel:  Lab Results  Component Value Date   NA 128 (L) 04/19/2023   K 3.5 04/19/2023   CO2 22 04/19/2023   GLUCOSE 213 (H) 04/19/2023   BUN 10 04/19/2023   CREATININE 0.40 (L)  04/19/2023   CALCIUM 8.8 (L) 04/19/2023   GFRNONAA >60 04/19/2023   GFRAA >60 03/12/2015   Lipid Panel:  Lab Results  Component Value Date   LDLCALC 115 (H) 04/16/2023   HgbA1c:  Lab Results  Component Value Date   HGBA1C 7.1 (A) 02/10/2023   MR C-spine (personally reviewed): No acute findings.  Normal appearance of the cervical spinal cord.  Mild noncompressive disc bulging at C2-3 through C6-7 without significant stenosis or neural impingement   MRI Brain(Personally reviewed): No acute findings.  Subcentimeter remote lacunar infarct in the posterior aspect of the right lentiform nucleus   B12, TSH normal.  A1c 7.1.  CSF: Color yellow, lab reported xanthochromia.  I did see the CSF photo sent by Dr. Coletta Memos is in the chart under the media tab, under photo  section-looks yellow Glucose 144 Protein greater than 600 RBC tube 1 and 4: 386, 19 WBC tube 1 and 4: 9 and 9, normal distribution of lymphocytes and PMNs.   Assessment   [name] is a 53 y.o. female past history of diabetes, hypertension, hyperlipidemia presenting with about 7 days worth of paresthesias-tingling numbness in the fingers and toes along with heaviness in her tongue and dysphagia that has progressed to the point where she is now having difficult walking and has started affected her p.o. intake.  Her examination reveals a glove and stocking type of sensory loss.  Intact DTRs. Brain and C-spine MRI unrevealing.  Initial lab work with hyponatremia that has progressed over the last few days.  Spinal tap revealed protein greater than 600, 9 WBCs with only 19 RBCs-that is definitely a albumin cytological dissociation. Oligoclonal bands and IgG index is pending.  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  Given the presence of this mild pleocytosis, however, I will also check an quantitative HIV as acute HIV presentation can be associated with AIDP even prior to positive antibody formation.  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  SIADH is a very common association with Guillain-Barr syndrome, so my suspicion is the hyponatremia is likely related.  Recommendations  IVIG 400 mg/kg x 5 days, today is day two Therapy assessments Monitoring and management of electrolytes per primary team. ______________________________________________________________________  Ritta Slot, MD [facility] [phone]  If 7pm- 7am, please page neurology on call as listed in AMION.

## 2023-04-22 DIAGNOSIS — E871 Hypo-osmolality and hyponatremia: Secondary | ICD-10-CM | POA: Diagnosis not present

## 2023-04-22 DIAGNOSIS — G610 Guillain-Barre syndrome: Secondary | ICD-10-CM | POA: Diagnosis not present

## 2023-04-22 DIAGNOSIS — E119 Type 2 diabetes mellitus without complications: Secondary | ICD-10-CM | POA: Diagnosis not present

## 2023-04-22 DIAGNOSIS — E876 Hypokalemia: Secondary | ICD-10-CM | POA: Diagnosis not present

## 2023-04-22 LAB — CSF CULTURE W GRAM STAIN: Gram Stain: NONE SEEN

## 2023-04-22 LAB — GLUCOSE, CAPILLARY
Glucose-Capillary: 180 mg/dL — ABNORMAL HIGH (ref 70–99)
Glucose-Capillary: 118 mg/dL — ABNORMAL HIGH (ref 70–99)
Glucose-Capillary: 161 mg/dL — ABNORMAL HIGH (ref 70–99)
Glucose-Capillary: 182 mg/dL — ABNORMAL HIGH (ref 70–99)

## 2023-04-22 LAB — HIV-1 RNA QUANT-NO REFLEX-BLD
HIV 1 RNA Quant: <20 {copies}/mL
LOG10 HIV-1 RNA: UNDETERMINED {Log}

## 2023-04-22 MED ORDER — TRAZODONE HCL 50 MG PO TABS
25.0000 mg | ORAL_TABLET | Freq: Every evening | ORAL | Status: DC | PRN
  Administered 2023-04-22: 25 mg via ORAL
  Filled 2023-04-22: qty 1

## 2023-04-22 NOTE — Progress Notes (Signed)
[facility]  - [place] [facility]   PATIENT NAME: Becky Shaw    MR#:  [id number]  DATE OF BIRTH:  1970-04-06  SUBJECTIVE:  patient seems to be stable at present. Her tingling numbness is stable. Receiving IV IG. Ambulating to and from in the room with walker. Trying to swallow slowly. No choking spell.  VITALS:  Blood pressure 123/67, pulse 95, temperature 97.6 F (36.4 C), temperature source Oral, resp. rate 16, height 5\' 1"  (1.549 m), weight 63 kg, SpO2 100%.  PHYSICAL EXAMINATION:   GENERAL:  53 y.o.-year-old patient with no acute distress.  LUNGS: Normal breath sounds bilaterally, no wheezing CARDIOVASCULAR: S1, S2 normal. No murmur   ABDOMEN: Soft, nontender, nondistended. Bowel sounds present.  EXTREMITIES: No  edema b/l.    NEUROLOGIC: nonfocal  patient is alert and awake decreased sensation in lower extremity.  LABORATORY PANEL:  CBC Recent Labs  Lab 04/20/23 0419  WBC 14.1*  HGB 16.6*  HCT 45.6  PLT 250    Chemistries  Recent Labs  Lab 04/16/23 1228 04/19/23 0014 04/19/23 0212 04/19/23 1506 04/21/23 1255  NA 128*   < >  --    < > 128*  K 3.3*   < >  --    < > 3.8  CL 90*   < >  --    < > 90*  CO2 30   < >  --    < > 23  GLUCOSE 207*   < >  --    < > 191*  BUN 9   < >  --    < > 21*  CREATININE 0.53   < >  --    < > 0.63  CALCIUM 10.2   < >  --    < > 9.3  MG  --   --  2.6*  --   --   AST 19  --   --   --   --   ALT 30  --   --   --   --   ALKPHOS 96  --   --   --   --   BILITOT 1.7*  --   --   --   --    < > = values in this interval not displayed.   Cardiac Enzymes No results for input(s): "TROPONINI" in the last 168 hours. RADIOLOGY:  No results found.   Assessment and Plan Becky Shaw is a 53 y.o. female with medical history significant of HTN, newly diagnosed diabetes presented with numbness tingling of toes, fingertips and tongue   Started 5 days ago, patient started develop bilateral toes tingling sensations,  some extended to bilateral feet bilateral hands fingertips and lips and changes denied any weakness at that point.  Went to [facility] ED on 1/21, brain MRI showed no acute findings but remote lacunar stroke and patient discharged home and referred to neurology.  Demyelinating disease/Guillain-Barr syndrome -- patient came in with tingling numbness lower extremity went up to her fingers along with numbness of her tongue and some difficulty swallowing solids. -- LP showed elevated protein >600, Xanthochromia  -- MRI lumbar spine shows diffuse inflammation/thickening of nerves in the lumbar spine consistent with demyelinating process -- empiric antibiotics discontinued. --Patient started on IV IG infusions per Dr. Scot Jun 5 days -- EMG as outpatient and 4 to 6 weeks --pending HIV and CMV titers  SIADH?Hyponatremia/hypokalemia -- IV fluids, replace potassium --in the setting of GBS  Type II diabetes with hyperglycemia new onset -- sliding scale insulin -- resume metformin at discharge  Hypertension  --continue amlodipine  Procedures: LP Family communication : son Consults : neuro- CODE STATUS: full  DVT Prophylaxis : Lovenox Level of care: Telemetry Medical Status is: Inpatient Remains inpatient appropriate because: GB syndrome patient requires IV IG    TOTAL TIME TAKING CARE OF THIS PATIENT: 35 minutes.  >50% time spent on counselling and coordination of care  Note: This dictation was prepared with Dragon dictation along with smaller phrase technology. Any transcriptional errors that result from this process are unintentional.  Enedina Finner M.D    [facility]   CC: Primary care physician; Eustaquio Boyden, MD

## 2023-04-22 NOTE — Plan of Care (Signed)
Problem: Activity: Goal: Risk for activity intolerance will decrease Outcome: Progressing   Problem: Nutrition: Goal: Adequate nutrition will be maintained Outcome: Progressing   Problem: Elimination: Goal: Will not experience complications related to bowel motility Outcome: Progressing Goal: Will not experience complications related to urinary retention Outcome: Progressing

## 2023-04-22 NOTE — Progress Notes (Signed)
Physical Therapy Treatment Patient Details Name: Becky Shaw MRN: [id number] DOB: [birth year] Today's Date: 04/22/2023   History of Present Illness Patient is a 53 year old with several day history of progressive paresthesias, tingling numbness in fingers and toes along with heaviness in her tongue, dysphagia. Difficulty walking. Lumbar puncture with CSF analysis pending. 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    PT Comments  Pt is making good progress towards goals with improved safety with ambulation progressing to short distances without AD in room. Pt also progressed gait challenges including side stepping and retro ambulation in hallway with 1 hand rail. Encouraged continued mobility. Pt reports back tightness, demonstrates stretches to assist in seated/supine position. Will continue to progress.    If plan is discharge home, recommend the following: A little help with walking and/or transfers;A little help with bathing/dressing/bathroom;Assistance with cooking/housework;Assist for transportation;Help with stairs or ramp for entrance   Can travel by private vehicle        Equipment Recommendations  Rolling walker (2 wheels)    Recommendations for Other Services       Precautions / Restrictions Precautions Precautions: Fall Restrictions Weight Bearing Restrictions Per Provider Order: No     Mobility  Bed Mobility Overal bed mobility: Modified Independent Bed Mobility: Supine to Sit, Sit to Supine     Supine to sit: Modified independent (Device/Increase time)     General bed mobility comments: safe technique with ease of mobility    Transfers Overall transfer level: Modified independent Equipment used: Rolling walker (2 wheels) Transfers: Sit to/from Stand Sit to Stand: Modified independent (Device/Increase time)           General transfer comment: safe technique. Able to progress by practicing transfers without AD     Ambulation/Gait Ambulation/Gait assistance: Supervision Gait Distance (Feet): 300 Feet Assistive device: Rolling walker (2 wheels) Gait Pattern/deviations: Step-through pattern, Decreased stride length Gait velocity: walks 10' in 7 seconds     General Gait Details: ambulated around RN station with reciprocal gait pattern. Pt needs cues for sequencing and directions. Pt also performed further gait in hallway holding onto railing with ability to ambulate in backwards and sideways positions.   Stairs             Wheelchair Mobility     Tilt Bed    Modified Rankin (Stroke Patients Only)       Balance Overall balance assessment: Mild deficits observed, not formally tested                                          Cognition Arousal: Alert Behavior During Therapy: WFL for tasks assessed/performed Overall Cognitive Status: Within Functional Limits for tasks assessed                                 General Comments: pleasant and agreeable to session        Exercises Other Exercises Other Exercises: standing ther-ex performed with B hand support on sink including mini squats, heel raises, and toe raises. 10 reps with supervision. Wrote on white board to facilitate compliance    General Comments        Pertinent Vitals/Pain Pain Assessment Pain Assessment: No/denies pain    Home Living  Prior Function            PT Goals (current goals can now be found in the care plan section) Acute Rehab PT Goals Patient Stated Goal: to get better PT Goal Formulation: With patient Time For Goal Achievement: 05/04/23 Potential to Achieve Goals: Good Progress towards PT goals: Progressing toward goals    Frequency    Min 1X/week      PT Plan      Co-evaluation              AM-PAC PT "6 Clicks" Mobility   Outcome Measure  Help needed turning from your back to your side while in a flat  bed without using bedrails?: None Help needed moving from lying on your back to sitting on the side of a flat bed without using bedrails?: None Help needed moving to and from a bed to a chair (including a wheelchair)?: None Help needed standing up from a chair using your arms (e.g., wheelchair or bedside chair)?: None Help needed to walk in hospital room?: A Little Help needed climbing 3-5 steps with a railing? : A Little 6 Click Score: 22    End of Session Equipment Utilized During Treatment: Gait belt Activity Tolerance: Patient tolerated treatment well Patient left: in bed (seated on EOB) Nurse Communication: Mobility status PT Visit Diagnosis: Unsteadiness on feet (R26.81);Muscle weakness (generalized) (M62.81)     Time: 0981-1914 PT Time Calculation (min) (ACUTE ONLY): 18 min  Charges:    $Gait Training: 8-22 mins PT General Charges $$ ACUTE PT VISIT: 1 Visit                     Elizabeth Palau, PT, DPT, GCS [phone]    Becky Shaw 04/22/2023, 3:51 PM

## 2023-04-22 NOTE — Plan of Care (Signed)

## 2023-04-22 NOTE — Progress Notes (Signed)
Respiratory Parameters:  Vital Capacity:  1.86L Nif: -24

## 2023-04-23 DIAGNOSIS — E871 Hypo-osmolality and hyponatremia: Secondary | ICD-10-CM | POA: Diagnosis not present

## 2023-04-23 LAB — GLUCOSE, CAPILLARY
Glucose-Capillary: 163 mg/dL — ABNORMAL HIGH (ref 70–99)
Glucose-Capillary: 119 mg/dL — ABNORMAL HIGH (ref 70–99)
Glucose-Capillary: 130 mg/dL — ABNORMAL HIGH (ref 70–99)
Glucose-Capillary: 131 mg/dL — ABNORMAL HIGH (ref 70–99)

## 2023-04-23 LAB — BASIC METABOLIC PANEL
Anion gap: 8 (ref 5–15)
BUN: 20 mg/dL (ref 6–20)
CO2: 26 mmol/L (ref 22–32)
Calcium: 9.4 mg/dL (ref 8.9–10.3)
Chloride: 99 mmol/L (ref 98–111)
Creatinine, Ser: 0.75 mg/dL (ref 0.44–1.00)
GFR, Estimated: >60 mL/min (ref >60)
Glucose, Bld: 181 mg/dL — ABNORMAL HIGH (ref 70–99)
Potassium: 3.9 mmol/L (ref 3.5–5.1)
Sodium: 133 mmol/L — ABNORMAL LOW (ref 135–145)

## 2023-04-23 LAB — CYTOLOGY - NON PAP

## 2023-04-23 LAB — MISC LABCORP TEST (SEND OUT): Labcorp test code: 138693

## 2023-04-23 LAB — SODIUM, URINE, RANDOM: Sodium, Ur: <10 mmol/L

## 2023-04-23 LAB — OSMOLALITY, URINE: Osmolality, Ur: 564 mosm/kg (ref 300–900)

## 2023-04-23 LAB — OSMOLALITY: Osmolality: 298 mosm/kg — ABNORMAL HIGH (ref 275–295)

## 2023-04-23 NOTE — Progress Notes (Signed)
RT note:  NIF: -35 CmH2O  VC: 1.57L.  Best of 3 attempts.  Difficulty with seal on mouthpiece.  Pt on room air and in no respiratory distress.

## 2023-04-23 NOTE — Progress Notes (Signed)
Nurse tech called nurse to the room.  Patient is complaining of some increase facial numbness/tingling, reports that she is not able to smile, and reports some blurry vision.  Nurse completed a neuro assessment. Grips equal, pupils round/reactive, able to hold hold arms in front of her without and dropping. Patient is A&OX4. Patient is able to so math (2+2,3+3, and 9*9).  As nurse was was about to notify provider via secure chat Neuro provider at the bedside and a full assessment was complete and the neuro provider states that the he will address.

## 2023-04-23 NOTE — Progress Notes (Signed)
NEUROLOGY CONSULT FOLLOW UP NOTE   Date of service: April 23, 2023 Patient Name: Becky Shaw MRN:  [id number] DOB:  1970-08-18  Interval Hx/subjective  She complains of some facial weakness today that she had not noticed yesterday  Vitals   Vitals:   04/23/23 0907 04/23/23 1004 04/23/23 1051 04/23/23 1532  BP: (!) 124/55 (!) 116/53 (!) 113/52 104/76  Pulse: 90 76 85 (!) 108  Resp: 15  17 18   Temp: 98.9 F (37.2 C) 98.1 F (36.7 C) 98.7 F (37.1 C) 98.3 F (36.8 C)  TempSrc: Oral Oral Oral   SpO2: 100% 100% 100% 99%  Weight:      Height:         Body mass index is 26.24 kg/m.  Physical Exam   General: Awake alert in no distress Neurological exam Awake alert oriented x 3 She appears to have bilateral mild facial weakness She has blurred vision with both eyes open which does not worsen in any direction of gaze, however her vision clears with covering either eye She has mild ankle flexion weakness, as well as mild hip flexion weakness bilaterally, she has impaired grip strength with preserved proximal strength She has no reflexes at the knee or ankle, I am still able to elicit biceps reflexes bilaterally She has decreased sensation to just above the knee bilaterally, as well as to the mid forearm   Medications  Current Facility-Administered Medications:    acetaminophen (TYLENOL) tablet 650 mg, 650 mg, Oral, Q6H PRN, Mikey College T, MD   amLODipine (NORVASC) tablet 2.5 mg, 2.5 mg, Oral, Daily, Chipper Herb, Ping T, MD, 2.5 mg at 04/22/23 0848   aspirin EC tablet 81 mg, 81 mg, Oral, Daily, Chipper Herb, Ping T, MD, 81 mg at 04/23/23 0910   atorvastatin (LIPITOR) tablet 10 mg, 10 mg, Oral, Once per day on Monday Thursday, Zhang, Ping T, MD, 10 mg at 04/21/23 3235   bisacodyl (DULCOLAX) EC tablet 10 mg, 10 mg, Oral, Daily, Enedina Finner, MD, 10 mg at 04/23/23 0910   cyclobenzaprine (FLEXERIL) tablet 5-10 mg, 5-10 mg, Oral, BID PRN, Mikey College T, MD, 10 mg at 04/23/23 0911   enoxaparin  (LOVENOX) injection 40 mg, 40 mg, Subcutaneous, Q24H, Mikey College T, MD, 40 mg at 04/23/23 0910   Immune Globulin 10% (PRIVIGEN) IV infusion 20 g, 400 mg/kg (Ideal), Intravenous, Q24 Hr x 5, Milon Dikes, MD, Stopped at 04/23/23 1048   insulin aspart (novoLOG) injection 0-5 Units, 0-5 Units, Subcutaneous, QHS, Enedina Finner, MD, 2 Units at 04/21/23 2207   insulin aspart (novoLOG) injection 0-9 Units, 0-9 Units, Subcutaneous, TID WC, Enedina Finner, MD, 2 Units at 04/23/23 0817   metFORMIN (GLUCOPHAGE-XR) 24 hr tablet 500 mg, 500 mg, Oral, BID WC, Enedina Finner, MD, 500 mg at 04/23/23 0818   ondansetron (ZOFRAN) injection 4 mg, 4 mg, Intravenous, Q6H PRN, Mikey College T, MD   oxyCODONE (Oxy IR/ROXICODONE) immediate release tablet 5 mg, 5 mg, Oral, Q6H PRN, Mikey College T, MD, 5 mg at 04/20/23 0344   polyethylene glycol (MIRALAX / GLYCOLAX) packet 17 g, 17 g, Oral, Daily, Enedina Finner, MD, 17 g at 04/20/23 1239   traZODone (DESYREL) tablet 25 mg, 25 mg, Oral, QHS PRN, Enedina Finner, MD, 25 mg at 04/22/23 2133  Labs and Diagnostic Imaging   CBC:  Recent Labs  Lab 04/19/23 0014 04/20/23 0419  WBC 15.3* 14.1*  HGB 17.0* 16.6*  HCT 47.2* 45.6  MCV 82.7 82.5  PLT 278 250    Basic  Metabolic Panel:  Lab Results  Component Value Date   NA 133 (L) 04/23/2023   K 3.9 04/23/2023   CO2 26 04/23/2023   GLUCOSE 181 (H) 04/23/2023   BUN 20 04/23/2023   CREATININE 0.75 04/23/2023   CALCIUM 9.4 04/23/2023   GFRNONAA >60 04/23/2023   GFRAA >60 03/12/2015   Lipid Panel:  Lab Results  Component Value Date   LDLCALC 115 (H) 04/16/2023   HgbA1c:  Lab Results  Component Value Date   HGBA1C 7.1 (A) 02/10/2023   MR C-spine (personally reviewed): No acute findings.  Normal appearance of the cervical spinal cord.  Mild noncompressive disc bulging at C2-3 through C6-7 without significant stenosis or neural impingement   MRI Brain(Personally reviewed): No acute findings.  Subcentimeter remote lacunar  infarct in the posterior aspect of the right lentiform nucleus   B12, TSH normal.  A1c 7.1.  CSF: Color yellow, lab reported xanthochromia.  I did see the CSF photo sent by Dr. Coletta Memos is in the chart under the media tab, under photo section-looks yellow Glucose 144 Protein greater than 600 RBC tube 1 and 4: 386, 19 WBC tube 1 and 4: 9 and 9, normal distribution of lymphocytes and PMNs. CMV PCR and quantitative HIV are negative  Assessment   [name] is a 53 y.o. female past history of diabetes, hypertension, hyperlipidemia presenting with about 7 days worth of paresthesias-tingling numbness in the fingers and toes along with heaviness in her tongue and dysphagia that has progressed to the point where she is now having difficult walking and has started affected her p.o. intake.  Her examination reveals a glove and stocking type of sensory loss.  Intact DTRs. Brain and C-spine MRI unrevealing.  Initial lab work with hyponatremia that has progressed over the last few days.  Spinal tap revealed protein greater than 600, 9 WBCs with only 19 RBCs-that is definitely a albumin cytological dissociation. Oligoclonal bands and IgG index is pending.  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  With negative CMV and HIV I would continue to treat this as Guillain-Barr.  SIADH is a very common association with Guillain-Barr syndrome, so my suspicion is the hyponatremia is likely related.  She does appear to be having some degree of progression still, my hope is that she will soon nadir.  Recommendations  IVIG 400 mg/kg x 5 days, today is day 4 Therapy assessments Monitoring and management of electrolytes per primary team. ______________________________________________________________________  Ritta Slot, MD [facility] [phone]  If 7pm- 7am, please page neurology on call as listed in  [facility].

## 2023-04-23 NOTE — Progress Notes (Signed)
[facility]  - [place] [facility]   PATIENT NAME: Becky Shaw    MR#:  [id number]  DATE OF BIRTH:  [birth year]  SUBJECTIVE:  patient seems to be stable at present. Her tingling numbness is stable. Receiving IV IG. Ambulating to and from in the room with walker. Trying to swallow slowly. No choking spell.  VITALS:  Blood pressure (!) 113/52, pulse 85, temperature 98.7 F (37.1 C), temperature source Oral, resp. rate 17, height 5\' 1"  (1.549 m), weight 63 kg, SpO2 100%.  PHYSICAL EXAMINATION:   GENERAL:  53 y.o.-year-old patient with no acute distress.  LUNGS: Normal breath sounds bilaterally, no wheezing CARDIOVASCULAR: S1, S2 normal. No murmur   ABDOMEN: Soft, nontender, nondistended. Bowel sounds present.  EXTREMITIES: No  edema b/l.    NEUROLOGIC: nonfocal  patient is alert and awake decreased sensation in lower extremity.  LABORATORY PANEL:  CBC Recent Labs  Lab 04/20/23 0419  WBC 14.1*  HGB 16.6*  HCT 45.6  PLT 250    Chemistries  Recent Labs  Lab 04/16/23 1228 04/19/23 0014 04/19/23 0212 04/19/23 1506 04/21/23 1255  NA 128*   < >  --    < > 128*  K 3.3*   < >  --    < > 3.8  CL 90*   < >  --    < > 90*  CO2 30   < >  --    < > 23  GLUCOSE 207*   < >  --    < > 191*  BUN 9   < >  --    < > 21*  CREATININE 0.53   < >  --    < > 0.63  CALCIUM 10.2   < >  --    < > 9.3  MG  --   --  2.6*  --   --   AST 19  --   --   --   --   ALT 30  --   --   --   --   ALKPHOS 96  --   --   --   --   BILITOT 1.7*  --   --   --   --    < > = values in this interval not displayed.   Cardiac Enzymes No results for input(s): "TROPONINI" in the last 168 hours. RADIOLOGY:  No results found.   Assessment and Plan Becky Shaw is a 53 y.o. female with medical history significant of HTN, newly diagnosed diabetes presented with numbness tingling of toes, fingertips and tongue   Started 5 days ago, patient started develop bilateral toes tingling  sensations, some extended to bilateral feet bilateral hands fingertips and lips and changes denied any weakness at that point.  Went to [facility] on 1/21, brain MRI showed no acute findings but remote lacunar stroke and patient discharged home and referred to neurology.  Demyelinating disease/Guillain-Barr syndrome -- patient came in with tingling numbness lower extremity went up to her fingers along with numbness of her tongue and some difficulty swallowing solids. -- LP showed elevated protein >600, Xanthochromia  -- MRI lumbar spine shows diffuse inflammation/thickening of nerves in the lumbar spine consistent with demyelinating process -- empiric antibiotics discontinued. --Patient started on IV IG infusions per Dr. Scot Jun 5 days, today is day 4 -- EMG as outpatient and 4 to 6 weeks -- hiv neg, cmv neg  Hyponatremia Known complication of gb but  doesn't appear we have worked it up or monitored past two days. Did have recent normal tsh - repeat bmp today, also check serum osm, urine osm, am cortisol, urine sodium -- tolerating fluids, doesn't appear dehydrated. Was on fluids, now discontinued  Type II diabetes with hyperglycemia new onset -- sliding scale insulin -- resume metformin at discharge  Hypertension  --continue amlodipine  Procedures: LP Family communication : none at bedside Consults : neuro- CODE STATUS: full  DVT Prophylaxis : Lovenox Level of care: Telemetry Medical Status is: Inpatient Remains inpatient appropriate because: GB syndrome patient requires IV IG    Silvano Bilis M.D    [facility]   CC: Primary care physician; Eustaquio Boyden, MD

## 2023-04-23 NOTE — Plan of Care (Signed)
?  Problem: Education: ?Goal: Knowledge of General Education information will improve ?Description: Including pain rating scale, medication(s)/side effects and non-pharmacologic comfort measures ?Outcome: Progressing ?  ?Problem: Clinical Measurements: ?Goal: Ability to maintain clinical measurements within normal limits will improve ?Outcome: Progressing ?Goal: Diagnostic test results will improve ?Outcome: Progressing ?  ?Problem: Nutrition: ?Goal: Adequate nutrition will be maintained ?Outcome: Progressing ?  ?Problem: Coping: ?Goal: Level of anxiety will decrease ?Outcome: Progressing ?  ?

## 2023-04-23 NOTE — Progress Notes (Signed)
RT note:  NIF -20  VC 1.04, best of 3 attempts

## 2023-04-24 DIAGNOSIS — E871 Hypo-osmolality and hyponatremia: Secondary | ICD-10-CM | POA: Diagnosis not present

## 2023-04-24 DIAGNOSIS — G610 Guillain-Barre syndrome: Secondary | ICD-10-CM | POA: Diagnosis not present

## 2023-04-24 LAB — CORTISOL-AM, BLOOD: Cortisol - AM: 7 ug/dL (ref 6.7–22.6)

## 2023-04-24 LAB — BASIC METABOLIC PANEL
Anion gap: 10 (ref 5–15)
BUN: 22 mg/dL — ABNORMAL HIGH (ref 6–20)
CO2: 26 mmol/L (ref 22–32)
Calcium: 9.3 mg/dL (ref 8.9–10.3)
Chloride: 100 mmol/L (ref 98–111)
Creatinine, Ser: 0.57 mg/dL (ref 0.44–1.00)
GFR, Estimated: >60 mL/min (ref >60)
Glucose, Bld: 132 mg/dL — ABNORMAL HIGH (ref 70–99)
Potassium: 3.7 mmol/L (ref 3.5–5.1)
Sodium: 136 mmol/L (ref 135–145)

## 2023-04-24 LAB — GLUCOSE, CAPILLARY
Glucose-Capillary: 137 mg/dL — ABNORMAL HIGH (ref 70–99)
Glucose-Capillary: 159 mg/dL — ABNORMAL HIGH (ref 70–99)
Glucose-Capillary: 228 mg/dL — ABNORMAL HIGH (ref 70–99)
Glucose-Capillary: 143 mg/dL — ABNORMAL HIGH (ref 70–99)

## 2023-04-24 MED ORDER — COSYNTROPIN 0.25 MG IJ SOLR
0.2500 mg | Freq: Once | INTRAMUSCULAR | Status: AC
  Administered 2023-04-25: 0.25 mg via INTRAVENOUS
  Filled 2023-04-24: qty 0.25

## 2023-04-24 NOTE — Progress Notes (Signed)
NEUROLOGY CONSULT FOLLOW UP NOTE   Date of service: April 24, 2023 Patient Name: Becky Shaw MRN:  [id number] DOB:  [birth year]  Interval Hx/subjective  She feels that she is improving Vitals   Vitals:   04/24/23 1017 04/24/23 1032 04/24/23 1102 04/24/23 1556  BP: 119/76 115/62 112/63 118/82  Pulse: 80 81 80   Resp: 16 16 16 16   Temp: 98.1 F (36.7 C) 98.1 F (36.7 C) 97.9 F (36.6 C) 98.4 F (36.9 C)  TempSrc: Oral Oral Oral Oral  SpO2: 100% 100% 100% 100%  Weight:      Height:         Body mass index is 26.24 kg/m.  Physical Exam   General: Awake alert in no distress Neurological exam Awake alert oriented x 3 She appears to have bilateral mild facial weakness, worse on the right She has blurred vision with both eyes open which does not worsen in any direction of gaze, however her vision clears with covering either eye She has mild ankle flexion weakness, as well as mild hip flexion weakness bilaterally, she has impaired grip strength with preserved proximal strength She is areflexic.  She has decreased sensation to just above the knee bilaterally, as well as to the mid forearm   Medications  Current Facility-Administered Medications:    acetaminophen (TYLENOL) tablet 650 mg, 650 mg, Oral, Q6H PRN, Mikey College T, MD   amLODipine (NORVASC) tablet 2.5 mg, 2.5 mg, Oral, Daily, Chipper Herb, Ping T, MD, 2.5 mg at 04/24/23 4098   aspirin EC tablet 81 mg, 81 mg, Oral, Daily, Mikey College T, MD, 81 mg at 04/24/23 1191   atorvastatin (LIPITOR) tablet 10 mg, 10 mg, Oral, Once per day on Monday Thursday, Zhang, Ping T, MD, 10 mg at 04/24/23 4782   bisacodyl (DULCOLAX) EC tablet 10 mg, 10 mg, Oral, Daily, Enedina Finner, MD, 10 mg at 04/24/23 0953   [START ON 04/25/2023] cosyntropin (CORTROSYN) injection 0.25 mg, 0.25 mg, Intravenous, Once, Wouk, Wilfred Curtis, MD   cyclobenzaprine (FLEXERIL) tablet 5-10 mg, 5-10 mg, Oral, BID PRN, Mikey College T, MD, 10 mg at 04/23/23 0911   enoxaparin  (LOVENOX) injection 40 mg, 40 mg, Subcutaneous, Q24H, Mikey College T, MD, 40 mg at 04/24/23 0953   insulin aspart (novoLOG) injection 0-5 Units, 0-5 Units, Subcutaneous, QHS, Enedina Finner, MD, 2 Units at 04/21/23 2207   insulin aspart (novoLOG) injection 0-9 Units, 0-9 Units, Subcutaneous, TID WC, Enedina Finner, MD, 3 Units at 04/24/23 1739   metFORMIN (GLUCOPHAGE-XR) 24 hr tablet 500 mg, 500 mg, Oral, BID WC, Enedina Finner, MD, 500 mg at 04/24/23 1740   ondansetron (ZOFRAN) injection 4 mg, 4 mg, Intravenous, Q6H PRN, Mikey College T, MD   oxyCODONE (Oxy IR/ROXICODONE) immediate release tablet 5 mg, 5 mg, Oral, Q6H PRN, Mikey College T, MD, 5 mg at 04/24/23 0748   polyethylene glycol (MIRALAX / GLYCOLAX) packet 17 g, 17 g, Oral, Daily, Enedina Finner, MD, 17 g at 04/24/23 0955   traZODone (DESYREL) tablet 25 mg, 25 mg, Oral, QHS PRN, Enedina Finner, MD, 25 mg at 04/22/23 2133  Labs and Diagnostic Imaging   CBC:  Recent Labs  Lab 04/19/23 0014 04/20/23 0419  WBC 15.3* 14.1*  HGB 17.0* 16.6*  HCT 47.2* 45.6  MCV 82.7 82.5  PLT 278 250    Basic Metabolic Panel:  Lab Results  Component Value Date   NA 136 04/24/2023   K 3.7 04/24/2023   CO2 26 04/24/2023   GLUCOSE 132 (H)  04/24/2023   BUN 22 (H) 04/24/2023   CREATININE 0.57 04/24/2023   CALCIUM 9.3 04/24/2023   GFRNONAA >60 04/24/2023   GFRAA >60 03/12/2015   Lipid Panel:  Lab Results  Component Value Date   LDLCALC 115 (H) 04/16/2023   HgbA1c:  Lab Results  Component Value Date   HGBA1C 7.1 (A) 02/10/2023   MR C-spine (personally reviewed): No acute findings.  Normal appearance of the cervical spinal cord.  Mild noncompressive disc bulging at C2-3 through C6-7 without significant stenosis or neural impingement   MRI Brain(Personally reviewed): No acute findings.  Subcentimeter remote lacunar infarct in the posterior aspect of the right lentiform nucleus   B12, TSH normal.  A1c 7.1.  CSF: Color yellow, lab reported xanthochromia.  I  did see the CSF photo sent by Dr. Coletta Memos is in the chart under the media tab, under photo section-looks yellow Glucose 144 Protein greater than 600 RBC tube 1 and 4: 386, 19 WBC tube 1 and 4: 9 and 9, normal distribution of lymphocytes and PMNs. CMV PCR and quantitative HIV are negative  Assessment   Shanicka Oldenkamp is a 53 y.o. female past history of diabetes, hypertension, hyperlipidemia presenting with about 7 days worth of paresthesias-tingling numbness in the fingers and toes along with heaviness in her tongue and dysphagia that has progressed to the point where she is now having difficult walking and has started affected her p.o. intake.  Her examination reveals a glove and stocking type of sensory loss.  Intact DTRs. Brain and C-spine MRI unrevealing.  Initial lab work with hyponatremia that has progressed over the last few days.  Spinal tap revealed protein greater than 600, 9 WBCs with only 19 RBCs-that is definitely a albumin cytological dissociation. Oligoclonal bands and IgG index is pending.  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  With negative CMV and HIV I would continue to treat this as Guillain-Barr.  SIADH is a very common association with Guillain-Barr syndrome, so my suspicion is the hyponatremia is likely related.  She appears to have finally started responding to IVIG.  Her negative inspiratory force is low, but I suspect this is due to poor mouth seal due to bilateral facial weakness   Recommendations  IVIG 400 mg/kg x 5 days, today is day 5 Continue PT/OT Monitoring and management of electrolytes per primary team. ______________________________________________________________________   Ritta Slot, MD [facility] [phone]  If 7pm- 7am, please page neurology on call as listed in AMION.

## 2023-04-24 NOTE — Progress Notes (Signed)
[facility]  - [place] at [facility]   PATIENT NAME: Becky Shaw    MR#:  [id number]  DATE OF BIRTH:  1970-05-02  SUBJECTIVE:  patient seems to be stable at present. Her tingling numbness is stable. Receiving IV IG. Ambulating to and from in the room with walker. Trying to swallow slowly. No choking spell.  VITALS:  Blood pressure 112/63, pulse 80, temperature 97.9 F (36.6 C), temperature source Oral, resp. rate 16, height 5\' 1"  (1.549 m), weight 63 kg, SpO2 100%.  PHYSICAL EXAMINATION:   GENERAL:  53 y.o.-year-old patient with no acute distress.  LUNGS: Normal breath sounds bilaterally, no wheezing CARDIOVASCULAR: S1, S2 normal. No murmur   ABDOMEN: Soft, nontender, nondistended. Bowel sounds present.  EXTREMITIES: No  edema b/l.    NEUROLOGIC: nonfocal  patient is alert and awake decreased sensation in lower extremity.  LABORATORY PANEL:  CBC Recent Labs  Lab 04/20/23 0419  WBC 14.1*  HGB 16.6*  HCT 45.6  PLT 250    Chemistries  Recent Labs  Lab 04/19/23 0212 04/19/23 1506 04/24/23 0509  NA  --    < > 136  K  --    < > 3.7  CL  --    < > 100  CO2  --    < > 26  GLUCOSE  --    < > 132*  BUN  --    < > 22*  CREATININE  --    < > 0.57  CALCIUM  --    < > 9.3  MG 2.6*  --   --    < > = values in this interval not displayed.   Cardiac Enzymes No results for input(s): "TROPONINI" in the last 168 hours. RADIOLOGY:  No results found.   Assessment and Plan Becky Shaw is a 53 y.o. female with medical history significant of HTN, newly diagnosed diabetes presented with numbness tingling of toes, fingertips and tongue   Started 5 days ago, patient started develop bilateral toes tingling sensations, some extended to bilateral feet bilateral hands fingertips and lips and changes denied any weakness at that point.  Went to [facility] ED on 1/21, brain MRI showed no acute findings but remote lacunar stroke and patient discharged home and  referred to neurology.  Demyelinating disease/Guillain-Barr syndrome -- patient came in with tingling numbness lower extremity went up to her fingers along with numbness of her tongue and some difficulty swallowing solids. -- LP showed elevated protein >600, Xanthochromia  -- MRI lumbar spine shows diffuse inflammation/thickening of nerves in the lumbar spine consistent with demyelinating process -- empiric antibiotics discontinued. --Patient started on IV IG infusions per Dr. Scot Jun 5 days, today is day 5 -- EMG as outpatient and 4 to 6 weeks -- hiv neg, cmv neg --neuro advises inpt monitoring until symptoms are stable to improving for at least 2 days. Today she reports feeling a little better with stable neurologic symptoms, so d/c tomorrow is a possibility  Hyponatremia Known complication of gb but doesn't appear we have worked it up or monitored past two days. Did have recent normal tsh - sodium normalized today, will continue fluid restriction - acth stim tomorrow, cortisol this morning was indeterminate  Type II diabetes with hyperglycemia new onset -- sliding scale insulin -- resume metformin at discharge  Hypertension  --continue amlodipine  Procedures: LP Family communication : none at bedside Consults : neuro- CODE STATUS: full  DVT Prophylaxis : Lovenox Level of care:  Telemetry Medical Status is: Inpatient Remains inpatient appropriate because: GB syndrome patient requires IV IG and monitoring    Becky Shaw M.D    [facility]

## 2023-04-24 NOTE — Progress Notes (Signed)
Physical Therapy Treatment Patient Details Name: Becky Shaw MRN: [id number] DOB: [birth year] Today's Date: 04/24/2023   History of Present Illness Patient is a 53 year old with several day history of progressive paresthesias, tingling numbness in fingers and toes along with heaviness in her tongue, dysphagia. Difficulty walking. Lumbar puncture with CSF analysis pending. 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    PT Comments  Pt is making good progress towards goals with ability to ambulate 5 laps with MS earlier this date. Focused session on standing there-ex to promote balance/strengthening. Educated on HEP. Pt has been indep in room, still reliant on RW for longer distances. Will dc in house and refer to MS for further conditioning and continue to recommend HHPT for follow up.     If plan is discharge home, recommend the following: A little help with walking and/or transfers;A little help with bathing/dressing/bathroom;Assistance with cooking/housework;Assist for transportation;Help with stairs or ramp for entrance   Can travel by private vehicle        Equipment Recommendations  Rolling walker (2 wheels)    Recommendations for Other Services       Precautions / Restrictions Precautions Precautions: Fall Restrictions Weight Bearing Restrictions Per Provider Order: No     Mobility  Bed Mobility Overal bed mobility: Independent Bed Mobility: Supine to Sit     Supine to sit: Independent     General bed mobility comments: improved technique with no physical assist required.    Transfers Overall transfer level: Independent Equipment used: None Transfers: Sit to/from Stand Sit to Stand: Independent           General transfer comment: improved technique. ONce standing can maintain upright posture without LOB.    Ambulation/Gait Ambulation/Gait assistance: Supervision Gait Distance (Feet): 40 Feet Assistive device: Rolling walker (2  wheels) Gait Pattern/deviations: Step-through pattern, Decreased stride length       General Gait Details: reports she has ambulated with MS laps in hallway. Ambulated in room with and without AD. Feels more confident with B hand support   Stairs             Wheelchair Mobility     Tilt Bed    Modified Rankin (Stroke Patients Only)       Balance Overall balance assessment: Mild deficits observed, not formally tested Sitting-balance support: Feet supported Sitting balance-Leahy Scale: Fair     Standing balance support: Single extremity supported, During functional activity Standing balance-Leahy Scale: Good                              Cognition Arousal: Alert Behavior During Therapy: WFL for tasks assessed/performed Overall Cognitive Status: Within Functional Limits for tasks assessed                                 General Comments: pleasant and agreeable to session        Exercises Other Exercises Other Exercises: standing ther-ex performed including heel raises, toe raises, mini squats, and forward reaching with single hand support. supervision required for safety    General Comments        Pertinent Vitals/Pain Pain Assessment Pain Assessment: No/denies pain    Home Living                          Prior Function  PT Goals (current goals can now be found in the care plan section) Acute Rehab PT Goals Patient Stated Goal: to get better PT Goal Formulation: With patient Time For Goal Achievement: 04/24/23 Potential to Achieve Goals: Good Progress towards PT goals: Progressing toward goals    Frequency    Min 1X/week      PT Plan      Co-evaluation              AM-PAC PT "6 Clicks" Mobility   Outcome Measure  Help needed turning from your back to your side while in a flat bed without using bedrails?: None Help needed moving from lying on your back to sitting on the side of a  flat bed without using bedrails?: None Help needed moving to and from a bed to a chair (including a wheelchair)?: None Help needed standing up from a chair using your arms (e.g., wheelchair or bedside chair)?: None Help needed to walk in hospital room?: None Help needed climbing 3-5 steps with a railing? : A Little 6 Click Score: 23    End of Session   Activity Tolerance: Patient tolerated treatment well Patient left: in chair Nurse Communication: Mobility status PT Visit Diagnosis: Unsteadiness on feet (R26.81);Muscle weakness (generalized) (M62.81)     Time: 9562-1308 PT Time Calculation (min) (ACUTE ONLY): 12 min  Charges:    $Therapeutic Exercise: 8-22 mins PT General Charges $$ ACUTE PT VISIT: 1 Visit                     Elizabeth Palau, PT, DPT, GCS [phone]    Becky Shaw 04/24/2023, 3:38 PM

## 2023-04-24 NOTE — Progress Notes (Signed)
RT Note:   NIF: -20  VC:  1.49L  Best of 3 attempts.  Pt on room air and in no resp distress.

## 2023-04-24 NOTE — Progress Notes (Signed)
Mobility Specialist - Progress Note   04/24/23 1152  Mobility  Activity Ambulated with assistance in hallway  Level of Assistance Standby assist, set-up cues, supervision of patient - no hands on  Assistive Device Front wheel walker  Distance Ambulated (ft) 800 ft  Activity Response Tolerated well  Mobility visit 1 Mobility  Mobility Specialist Start Time (ACUTE ONLY) 1124  Mobility Specialist Stop Time (ACUTE ONLY) 1138  Mobility Specialist Time Calculation (min) (ACUTE ONLY) 14 min   Pt simi fowler upon entry, utilizing RA. Pt reported feeling drowsy this date, however motivated and agreeable to OOB amb in the hallway this date--- going home is Pt's main motivation. Pt completes bed mob indep, STS to RW and amb 5 laps around the NS with supervision-- no cuing for direction, sequencing or body position this date/time. Pt returned to the room, left simi fowler with needs within reach.  Zetta Bills Mobility Specialist 04/24/23 11:57 AM

## 2023-04-24 NOTE — Plan of Care (Signed)

## 2023-04-24 NOTE — Progress Notes (Signed)
FVC 1.99L NIF -20 Patient tolerated procedure well.

## 2023-04-25 DIAGNOSIS — G610 Guillain-Barre syndrome: Secondary | ICD-10-CM | POA: Diagnosis not present

## 2023-04-25 LAB — ACTH STIMULATION, 3 TIME POINTS
Cortisol, 30 Min: 24.4 ug/dL
Cortisol, 60 Min: 27.5 ug/dL
Cortisol, Base: 9 ug/dL

## 2023-04-25 LAB — BASIC METABOLIC PANEL
Anion gap: 10 (ref 5–15)
BUN: 24 mg/dL — ABNORMAL HIGH (ref 6–20)
CO2: 28 mmol/L (ref 22–32)
Calcium: 9.3 mg/dL (ref 8.9–10.3)
Chloride: 101 mmol/L (ref 98–111)
Creatinine, Ser: 0.67 mg/dL (ref 0.44–1.00)
GFR, Estimated: >60 mL/min (ref >60)
Glucose, Bld: 122 mg/dL — ABNORMAL HIGH (ref 70–99)
Potassium: 4 mmol/L (ref 3.5–5.1)
Sodium: 139 mmol/L (ref 135–145)

## 2023-04-25 LAB — GLUCOSE, CAPILLARY: Glucose-Capillary: 135 mg/dL — ABNORMAL HIGH (ref 70–99)

## 2023-04-25 MED ORDER — OXYCODONE HCL 5 MG PO TABS: 5.0000 mg | ORAL_TABLET | Freq: Four times a day (QID) | ORAL | 0 refills | Status: DC | PRN

## 2023-04-25 NOTE — Progress Notes (Signed)
NIF -20

## 2023-04-25 NOTE — Discharge Summary (Addendum)
Becky Shaw GNF:[id number] DOB: 28-Sep-1970 DOA: 04/19/2023  PCP: Eustaquio Boyden, MD  Admit date: 04/19/2023 Discharge date: 04/25/2023  Time spent: 35 minutes  Recommendations for Outpatient Follow-up:  PCP f/u Neurology f/u 2/3 as scheduled  F/u acth stim test results (pending at time of d/c)    Discharge Diagnoses:  Principal Problem:   GBS (Guillain Barre syndrome) (HCC) Active Problems:   Essential hypertension   Type 2 diabetes mellitus without complication, without long-term current use of insulin (HCC)   Hyponatremia   Neuropathy   Hypokalemia   Discharge Condition: stable  Diet recommendation: regular  Filed Weights   04/19/23 0011  Weight: 63 kg    History of present illness:  From admission h and p Becky Shaw is a 53 y.o. female with medical history significant of HTN, newly diagnosed diabetes presented with numbness tingling of toes, fingertips and tongue   Started 5 days ago, patient started develop bilateral toes tingling sensations, some extended to bilateral feet bilateral hands fingertips and lips and changes denied any weakness at that point.  Went to [facility] ED on 1/21, brain MRI showed no acute findings but remote lacunar stroke and patient discharged home and referred to neurology.  Over the last 2 to 3 days, patient has developed worsening of above symptoms, and the patient she started to have balance problem and tendency to fall.  Denied any headache no neck pain no fever or chills.  Yesterday, she started to have feeling of nausea and " seem to have trouble swallowing, breathing she has tried swallow including food and water she would throw up right away" but denied any abdominal pain no diarrhea.  She was just recently diagnosed with diabetes and is yet to start metformin.  Hospital Course:   Becky Shaw is a 53 y.o. female with medical history significant of HTN, newly diagnosed diabetes presented with numbness tingling of toes,  fingertips and tongue   Started 5 days ago, patient started develop bilateral toes tingling sensations, some extended to bilateral feet bilateral hands fingertips and lips and changes denied any weakness at that point.  Went to [facility] ED on 1/21, brain MRI showed no acute findings but remote lacunar stroke and patient discharged home and referred to neurology.   Demyelinating disease/Guillain-Barr syndrome -- patient came in with tingling numbness lower extremity went up to her fingers along with numbness of her tongue and some difficulty swallowing solids. -- LP showed elevated protein >600, Xanthochromia -- MRI lumbar spine shows diffuse inflammation/thickening of nerves in the lumbar spine consistent with demyelinating process -- empiric antibiotics discontinued. --Patient started on IV IG infusions per Dr. Scot Jun 5 days, completed 1/30 -- hiv neg, cmv neg --neuro advises inpt monitoring until symptoms are stable to improving for at least 2 days. Today symptoms have been stable for 2 days and neuro (kirkpatrick) has given go-ahead for discharge, she has f/u with [facility] in 3 days  - rolling walker and home health pt/ot ordered   Hyponatremia, siadh Known complication of gb, here resolved with fluid restriction. W/u did reveal low morning cortisol, further evaluated w/ acth stim test, results are pending   Type II diabetes with hyperglycemia  controlled   Hypertension  Controlled  Procedures: LP   Consultations: neurology  Discharge Exam: Vitals:   04/24/23 2152 04/25/23 0852  BP: 125/66 122/68  Pulse: 99 92  Resp: 18 16  Temp: 98.5 F (36.9 C) 98 F (36.7 C)  SpO2: 99% 99%  GENERAL:  53 y.o.-year-old patient with no acute distress.  LUNGS: Normal breath sounds bilaterally, no wheezing CARDIOVASCULAR: S1, S2 normal. No murmur   ABDOMEN: Soft, nontender, nondistended. Bowel sounds present.  EXTREMITIES: No  edema b/l.    NEUROLOGIC:  nonfocal  patient is alert and awake decreased sensation in distal lower extremity and hands  Discharge Instructions   Discharge Instructions     Diet general   Complete by: As directed    Increase activity slowly   Complete by: As directed       Allergies as of 04/25/2023       Reactions   Cetirizine Hcl    REACTION: insomnia        Medication List     TAKE these medications    amLODipine 2.5 MG tablet Commonly known as: NORVASC Take 1 tablet (2.5 mg total) by mouth daily.   aspirin EC 81 MG tablet Take 1 tablet (81 mg total) by mouth daily. Swallow whole.   atorvastatin 10 MG tablet Commonly known as: LIPITOR Take 1 tablet (10 mg total) by mouth 2 (two) times a week.   cyclobenzaprine 10 MG tablet Commonly known as: FLEXERIL Take 0.5-1 tablets (5-10 mg total) by mouth 2 (two) times daily as needed for muscle spasms (sedation precautions).   metFORMIN 500 MG 24 hr tablet Commonly known as: GLUCOPHAGE-XR Take 1 tablet (500 mg total) by mouth daily with breakfast.   oxyCODONE 5 MG immediate release tablet Commonly known as: Oxy IR/ROXICODONE Take 1 tablet (5 mg total) by mouth every 6 (six) hours as needed for moderate pain (pain score 4-6).               Durable Medical Equipment  (From admission, onward)           Start     Ordered   04/25/23 0936  DME Dan Humphreys  Once       Question Answer Comment  Walker: With 5 Inch Wheels   Patient needs a walker to treat with the following condition Guillain Barr syndrome (HCC)      04/25/23 0936           Allergies  Allergen Reactions   Cetirizine Hcl     REACTION: insomnia    Follow-up Information     Eustaquio Boyden, MD Follow up.   Specialty: Family Medicine Contact information: [address] [place] [address] [phone]                  The results of significant diagnostics from this hospitalization (including imaging, microbiology, ancillary and  laboratory) are listed below for reference.    Significant Diagnostic Studies: MR Lumbar Spine W Wo Contrast Result Date: 04/20/2023 CLINICAL DATA:  Demyelinating disease. Question AIDP. Elevated CSF protein. EXAM: MRI LUMBAR SPINE WITHOUT AND WITH CONTRAST TECHNIQUE: Multiplanar and multiecho pulse sequences of the lumbar spine were obtained without and with intravenous contrast. CONTRAST:  6mL GADAVIST GADOBUTROL 1 MMOL/ML IV SOLN COMPARISON:  MRI of the head and cervical spine 04/15/2023 FINDINGS: Segmentation: 5 non rib-bearing lumbar type vertebral bodies are present. The lowest fully formed vertebral body is L5. Alignment: No significant listhesis is present. Mild straightening of the normal lumbar lordosis is present. Vertebrae:  Marrow signal and vertebral body heights are normal. Conus medullaris and cauda equina: Conus extends to the L1-2 level. The nerve roots of the cauda equina are diffusely thickened and demonstrate profuse enhancement. No discrete mass lesion is present. Paraspinal and other soft  tissues: The urinary bladder is moderately distended. No wall thickening or abnormality is present. Visualized abdomen and pelvis is otherwise within normal limits. Disc levels: No significant disc protrusion or stenosis is present in the lumbar spine. IMPRESSION: 1. Diffuse thickening and enhancement of the nerve roots of the cauda equina. This is consistent with the clinical diagnosis of Guillain-Barre syndrome. The differential diagnosis includes an acute presentation of CIDP. 2. No significant disc protrusion or stenosis in the lumbar spine. These results were called by telephone at the time of interpretation on 04/20/2023 at 11:29 am to provider [facility] , who verbally acknowledged these results. Electronically Signed   By: Marin Roberts M.D.   On: 04/20/2023 11:30   DG Lumbar Spine 2-3 Views Result Date: 04/19/2023 CLINICAL DATA:  Low back pain. EXAM: LUMBAR SPINE - 2-3 VIEW COMPARISON:   None Available. FINDINGS: There is no evidence of lumbar spine fracture. Alignment is normal. Intervertebral disc spaces are maintained. Mild degenerative osteophytosis seen at L2-3 and L3-4. IMPRESSION: No acute findings. Mild degenerative osteophytosis at L2-3 and L3-4, without disc space narrowing. Electronically Signed   By: Danae Orleans M.D.   On: 04/19/2023 10:07   DG Chest 2 View Result Date: 04/19/2023 CLINICAL DATA:  Numbness EXAM: CHEST - 2 VIEW COMPARISON:  Chest x-ray 04/15/2023 FINDINGS: The heart size and mediastinal contours are within normal limits. Both lungs are clear. The visualized skeletal structures are unremarkable. IMPRESSION: No active cardiopulmonary disease. Electronically Signed   By: Darliss Cheney M.D.   On: 04/19/2023 00:36   MR Cervical Spine W and Wo Contrast Result Date: 04/15/2023 CLINICAL DATA:  Initial evaluation for acute paresthesias of face and fingers. EXAM: MRI CERVICAL SPINE WITHOUT AND WITH CONTRAST TECHNIQUE: Multiplanar and multiecho pulse sequences of the cervical spine, to include the craniocervical junction and cervicothoracic junction, were obtained without and with intravenous contrast. CONTRAST:  6.10mL GADAVIST GADOBUTROL 1 MMOL/ML IV SOLN COMPARISON:  Concomitant brain MRI performed at the same time. FINDINGS: Alignment: Trace scoliosis. Straightening of the normal cervical lordosis without significant listhesis. Vertebrae: Vertebral body height maintained without acute or chronic fracture. Bone marrow signal intensity within normal limits. No worrisome osseous lesions. No abnormal marrow edema or enhancement. Cord: Normal signal and morphology.  No abnormal enhancement. Posterior Fossa, vertebral arteries, paraspinal tissues: Craniocervical junction within normal limits. Paraspinous soft tissues normal. Normal flow voids seen within the vertebral arteries bilaterally. 9 mm right thyroid nodule noted, of doubtful significance given size and patient age, no  follow-up imaging recommended (ref: J Am Coll Radiol. 2015 Feb;12(2): 143-50). Disc levels: C2-C3: Small central disc protrusion minimally indents the ventral thecal sac. No spinal stenosis. Foramina remain patent. C3-C4: Shallow central disc protrusion mildly indents the ventral thecal sac. Minimal cord flattening without cord signal changes. No significant spinal stenosis. Foramina remain patent. C4-C5: Mild disc bulge with uncovertebral spurring. Mild flattening of the ventral thecal sac without significant spinal stenosis. Foramina remain patent. C5-C6: Mild disc bulge with right greater than left uncovertebral spurring. Flattening of the ventral thecal sac without significant spinal stenosis. Foramina remain patent. C6-C7: Minimal disc bulge with uncovertebral spurring. No canal or foraminal stenosis. C7-T1:  Unremarkable. T1-2: Unremarkable. T2-3: Seen only on sagittal projection. Small central disc protrusion mildly indents the ventral thecal sac. No spinal stenosis. Foramina remain patent. IMPRESSION: 1. Normal MRI appearance of the cervical spinal cord. 2. Mild noncompressive disc bulging at C2-3 through C6-7 without significant stenosis or overt neural impingement. Electronically Signed   By:  Rise Mu M.D.   On: 04/15/2023 20:02   MR Brain W and Wo Contrast Result Date: 04/15/2023 CLINICAL DATA:  Initial evaluation for acute facial and limb paresthesias. EXAM: MRI HEAD WITHOUT AND WITH CONTRAST TECHNIQUE: Multiplanar, multiecho pulse sequences of the brain and surrounding structures were obtained without and with intravenous contrast. CONTRAST:  6.23mL GADAVIST GADOBUTROL 1 MMOL/ML IV SOLN COMPARISON:  None Available. FINDINGS: Brain: Cerebral volume within normal limits. Subcentimeter remote lacunar infarct noted at the posterior aspect of the right lentiform nucleus (series 9, image 24). No significant cerebral white matter disease elsewhere within the brain. No evidence for acute or  subacute ischemia. Gray-white matter differentiation otherwise maintained. No other areas of chronic cortical infarction. No acute or chronic intracranial blood products. No mass lesion, midline shift or mass effect. No hydrocephalus or extra-axial fluid collection. Pituitary gland suprasellar region within normal limits. No abnormal enhancement. Vascular: Major intracranial vascular flow voids are maintained. Skull and upper cervical spine: Craniocervical junction within normal limits. Bone marrow signal intensity normal. No scalp soft tissue abnormality. Sinuses/Orbits: Globes orbital soft tissues within normal limits. Paranasal sinuses are clear. No mastoid effusion. Other: None. IMPRESSION: 1. No acute intracranial abnormality. 2. Subcentimeter remote lacunar infarct at the posterior aspect of the right lentiform nucleus. 3. Otherwise normal brain MRI. Electronically Signed   By: Rise Mu M.D.   On: 04/15/2023 19:56   DG Chest 2 View Result Date: 04/15/2023 CLINICAL DATA:  Nonproductive cough.  Nausea. EXAM: CHEST - 2 VIEW COMPARISON:  None Available. FINDINGS: The heart size and mediastinal contours are within normal limits. Both lungs are clear. The visualized skeletal structures are unremarkable. IMPRESSION: No active cardiopulmonary disease. Electronically Signed   By: Danae Orleans M.D.   On: 04/15/2023 13:11    Microbiology: Recent Results (from the past 240 hours)  Resp panel by RT-PCR (RSV, Flu A&B, Covid) Urine, Clean Catch     Status: None   Collection Time: 04/15/23  1:26 PM   Specimen: Urine, Clean Catch; Nasal Swab  Result Value Ref Range Status   SARS Coronavirus 2 by RT PCR NEGATIVE NEGATIVE Final    Comment: (NOTE) SARS-CoV-2 target nucleic acids are NOT DETECTED.  The SARS-CoV-2 RNA is generally detectable in upper respiratory specimens during the acute phase of infection. The lowest concentration of SARS-CoV-2 viral copies this assay can detect is 138 copies/mL. A  negative result does not preclude SARS-Cov-2 infection and should not be used as the sole basis for treatment or other patient management decisions. A negative result may occur with  improper specimen collection/handling, submission of specimen other than nasopharyngeal swab, presence of viral mutation(s) within the areas targeted by this assay, and inadequate number of viral copies(<138 copies/mL). A negative result must be combined with clinical observations, patient history, and epidemiological information. The expected result is Negative.  Fact Sheet for Patients:  BloggerCourse.com  Fact Sheet for Healthcare Providers:  SeriousBroker.it  This test is no t yet approved or cleared by the [place] FDA and  has been authorized for detection and/or diagnosis of SARS-CoV-2 by FDA under an Emergency Use Authorization (EUA). This EUA will remain  in effect (meaning this test can be used) for the duration of the COVID-19 declaration under Section 564(b)(1) of the Act, 21 U.S.C.section 360bbb-3(b)(1), unless the authorization is terminated  or revoked sooner.       Influenza A by PCR NEGATIVE NEGATIVE Final   Influenza B by PCR NEGATIVE NEGATIVE Final  Comment: (NOTE) The Xpert Xpress SARS-CoV-2/FLU/RSV plus assay is intended as an aid in the diagnosis of influenza from Nasopharyngeal swab specimens and should not be used as a sole basis for treatment. Nasal washings and aspirates are unacceptable for Xpert Xpress SARS-CoV-2/FLU/RSV testing.  Fact Sheet for Patients: BloggerCourse.com  Fact Sheet for Healthcare Providers: SeriousBroker.it  This test is not yet approved or cleared by the [place] FDA and has been authorized for detection and/or diagnosis of SARS-CoV-2 by FDA under an Emergency Use Authorization (EUA). This EUA will remain in effect (meaning this test can  be used) for the duration of the COVID-19 declaration under Section 564(b)(1) of the Act, 21 U.S.C. section 360bbb-3(b)(1), unless the authorization is terminated or revoked.     Resp Syncytial Virus by PCR NEGATIVE NEGATIVE Final    Comment: (NOTE) Fact Sheet for Patients: BloggerCourse.com  Fact Sheet for Healthcare Providers: SeriousBroker.it  This test is not yet approved or cleared by the [place] FDA and has been authorized for detection and/or diagnosis of SARS-CoV-2 by FDA under an Emergency Use Authorization (EUA). This EUA will remain in effect (meaning this test can be used) for the duration of the COVID-19 declaration under Section 564(b)(1) of the Act, 21 U.S.C. section 360bbb-3(b)(1), unless the authorization is terminated or revoked.  Performed at [facility], [address], [place], [place] [address]   CSF culture w Gram Stain     Status: None   Collection Time: 04/19/23  9:57 AM   Specimen: CSF; Cerebrospinal Fluid  Result Value Ref Range Status   Specimen Description   Final    CSF Performed at [facility], [address], [place], [place] [address]    Special Requests   Final    NONE Performed at [facility], [address] [place], [place] [address]    Gram Stain   Final    NO ORGANISMS SEEN WBC SEEN RED BLOOD CELLS SEEN GRAM STAIN REVIEWED-AGREE WITH RESULT    Culture   Final    NO GROWTH 3 DAYS Performed at [facility], [address] [place], [place] [address]    Report Status 04/22/2023 FINAL  Final     Labs: Basic Metabolic Panel: Recent Labs  Lab 04/19/23 0212 04/19/23 0700 04/19/23 1506 04/21/23 1255 04/23/23 1237 04/24/23 0509 04/25/23 0651  NA  --   --  128* 128* 133* 136 139  K  --   --  3.5 3.8 3.9 3.7 4.0  CL  --   --  93* 90* 99 100 101  CO2  --   --  22 23 26 26 28   GLUCOSE  --   --  213* 191* 181* 132* 122*  BUN   --   --  10 21* 20 22* 24*  CREATININE  --   --  0.40* 0.63 0.75 0.57 0.67  CALCIUM  --   --  8.8* 9.3 9.4 9.3 9.3  MG 2.6*  --   --   --   --   --   --   PHOS  --  3.1  --   --   --   --   --    Liver Function Tests: Recent Labs  Lab 04/19/23 0957  ALBUMIN 4.7   No results for input(s): "LIPASE", "AMYLASE" in the last 168 hours. No results for input(s): "AMMONIA" in the last 168 hours. CBC: Recent Labs  Lab 04/19/23 0014 04/20/23 0419  WBC 15.3*  14.1*  HGB 17.0* 16.6*  HCT 47.2* 45.6  MCV 82.7 82.5  PLT 278 250   Cardiac Enzymes: No results for input(s): "CKTOTAL", "CKMB", "CKMBINDEX", "TROPONINI" in the last 168 hours. BNP: BNP (last 3 results) No results for input(s): "BNP" in the last 8760 hours.  ProBNP (last 3 results) No results for input(s): "PROBNP" in the last 8760 hours.  CBG: Recent Labs  Lab 04/24/23 0753 04/24/23 1124 04/24/23 1638 04/24/23 2151 04/25/23 0817  GLUCAP 137* 159* 228* 143* 135*       Signed:  Silvano Bilis MD.  [facility] 04/25/2023, 9:40 AM

## 2023-04-25 NOTE — Discharge Instructions (Signed)
[facility] They will call you to set up when they will come to your home for assessment   [facility] [address] [facility] [address], [place], [place] [address] Hours:  Open 24 hours Phone: [phone]

## 2023-04-25 NOTE — Progress Notes (Unsigned)
[facility] Neurology Division Clinic Note - Initial Visit   Date: 04/28/2023   Becky Shaw MRN: [id number] DOB: Jul 20, 1970   Dear Dr. Sharen Hones:  Thank you for your kind referral of Becky Shaw for consultation of Guillain-Barre Syndrome. Although her history is well known to you, please allow [place] to reiterate it for the purpose of our medical record. The patient was accompanied to the clinic by husband who also provides collateral information.     Becky Shaw is a 53 y.o. right-handed female with diabetes mellitus, hypertension, hyperlipidemia presenting for evaluation of Guillain-Barre syndrome.   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  CSF with profoundly elevated protein (> 600) and MRI lumbar spine shows diffuse nerve root enhancement.  She was treated with IVIG which has improved her proximal strength some and reduced the intensity of her painful paresthesias.  Her exam continues to show facial diplegia (no opthalmoplegia), diffuse loss of temperature involving all the limbs, arreflexia distally, and 4/5 motor strength in the hands and legs.   It does not appear that she is having any worsening of symptoms.  I explained that recovery can take weeks to months and that she should see motor improvement before sensation returns.    - For paresthesias, start gabapentin 300mg  at bedtime and titrate to 300mg  twice daily.  This can be increased as tolerable.   - Start home OT/PT  - NCS/EMG of the left arm and leg to guide prognosis  Return to clinic in 2 months  ------------------------------------------------------------- History of present illness: Starting on January 17th, she began having numbness/tingling of the toes and over the next few days, she began having more intense symptoms in the hands, mouth, tongue, and back.  She was also having generalized weakness.  She went  to the ER and was recommended out-patient follow-up with neurology.  A few days later, she became so weak that she was unable to walk, which prompted her to go to [facility].    She was evaluated by neurology who had high clinical suspicion for GBS and CSF testing confirmed the presence of albuminocytologic dissociation (CSF protein > 600), MRI lumbar spine with nerve root enhancement.  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  She was treated with IVIG 2g/kg over 5 days.  She has been more aware of temperature and tingling is less.  She continues to have diffuse numbness and weakness.  She also has facial weakness with incomplete eyeclosure on the right and is unable to smile. She is walking with a walker.    She works as a Research scientist (medical) at [employer].  She lives with husband and 20 year old son.  She does not smoke or drink alcohol.   Out-side paper records, electronic medical record, and images have been reviewed where available and summarized as:  MRI lumbar spine 04/20/2023: 1. Diffuse thickening and enhancement of the nerve roots of the cauda equina. This is consistent with the clinical diagnosis of Guillain-Barre syndrome. The differential diagnosis includes an acute presentation of CIDP. 2. No significant disc protrusion or stenosis in the lumbar spine.   MRI cervical spine 04/15/2023: 1. Normal MRI appearance of the cervical spinal cord. 2. Mild noncompressive disc bulging at C2-3 through C6-7 without significant stenosis or overt neural impingement.   MRI brain wo contrast 04/15/2023: 1. No acute intracranial abnormality. 2. Subcentimeter remote lacunar infarct at the posterior aspect of the right lentiform nucleus. 3. Otherwise normal brain MRI.  CSF  04/19/2023:  R19 R9 P>600 G144 encephalitis panel neg, IgG index - unable to be calculated  Labs:  HIV neg   Lab Results  Component Value Date   HGBA1C 7.1 (A) 02/10/2023   Lab Results  Component Value Date    VITAMINB12 605 04/14/2023   Lab Results  Component Value Date   TSH 2.54 04/14/2023   Lab Results  Component Value Date   ESRSEDRATE 33 (H) 04/16/2023    Past Medical History:  Diagnosis Date   ALLERGIC RHINITIS 05/17/2007   GANGLION CYST, WRIST, RIGHT 05/31/2008   Hyperlipidemia    OVERACTIVE BLADDER 05/15/2007   Prediabetes     Past Surgical History:  Procedure Laterality Date   CHOLECYSTECTOMY       Medications:  Outpatient Encounter Medications as of 04/28/2023  Medication Sig   amLODipine (NORVASC) 2.5 MG tablet Take 1 tablet (2.5 mg total) by mouth daily.   aspirin EC 81 MG tablet Take 1 tablet (81 mg total) by mouth daily. Swallow whole.   atorvastatin (LIPITOR) 10 MG tablet Take 1 tablet (10 mg total) by mouth 2 (two) times a week.   cyclobenzaprine (FLEXERIL) 10 MG tablet Take 0.5-1 tablets (5-10 mg total) by mouth 2 (two) times daily as needed for muscle spasms (sedation precautions).   gabapentin (NEURONTIN) 300 MG capsule Take 1 tablet at bedtime x 3 days, then take 1 tablet twice daily.   metFORMIN (GLUCOPHAGE-XR) 500 MG 24 hr tablet Take 1 tablet (500 mg total) by mouth daily with breakfast.   oxyCODONE (OXY IR/ROXICODONE) 5 MG immediate release tablet Take 1 tablet (5 mg total) by mouth every 6 (six) hours as needed for moderate pain (pain score 4-6).   [DISCONTINUED] acetaminophen (TYLENOL) tablet 650 mg    [DISCONTINUED] amLODipine (NORVASC) tablet 2.5 mg    [DISCONTINUED] aspirin EC tablet 81 mg    [DISCONTINUED] atorvastatin (LIPITOR) tablet 10 mg    [DISCONTINUED] bisacodyl (DULCOLAX) EC tablet 10 mg    [DISCONTINUED] cyclobenzaprine (FLEXERIL) tablet 5-10 mg    [DISCONTINUED] enoxaparin (LOVENOX) injection 40 mg    [DISCONTINUED] insulin aspart (novoLOG) injection 0-5 Units    [DISCONTINUED] insulin aspart (novoLOG) injection 0-9 Units    [DISCONTINUED] metFORMIN (GLUCOPHAGE-XR) 24 hr tablet 500 mg    [DISCONTINUED] ondansetron (ZOFRAN) injection 4 mg     [DISCONTINUED] oxyCODONE (Oxy IR/ROXICODONE) immediate release tablet 5 mg    [DISCONTINUED] polyethylene glycol (MIRALAX / GLYCOLAX) packet 17 g    [DISCONTINUED] traZODone (DESYREL) tablet 25 mg    No facility-administered encounter medications on file as of 04/28/2023.    Allergies:  Allergies  Allergen Reactions   Cetirizine Hcl     REACTION: insomnia    Family History: Family History  Problem Relation Age of Onset   Heart Problems Mother    Stroke Father 43   Hypertension Father    Diabetes Father    Hypertension Sister    Hypertension Brother    Stroke Brother 48   Transient ischemic attack Brother 108   Stroke Brother 59       massive   Hypertension Brother    Hypertension Brother     Social History: Social History   Tobacco Use   Smoking status: Never   Smokeless tobacco: Never  Vaping Use   Vaping status: Never Used  Substance Use Topics   Alcohol use: Not Currently   Drug use: No   Social History   Social History Narrative   11/25/19   From: the area  Living: with husband, Reyes Ivan (1988) and son   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, 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)      Family: son - Publishing rights manager (2005)      Enjoys: hiking, fishing      Exercise: walking 30 minutes a few times a week, hiking once a week   Diet: low sodium, veggies and less meat      Safety   Seat belts: Yes    Guns: Yes  and secure   Safe in relationships: Yes       Are you right handed or left handed? Right Handed    Are you currently employed ? 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 No   Who lives with you? Husband and son    What type of home do you live in: 1 story or 2 story? One story home        Vital Signs:  BP (!) 130/90   Pulse (!) 101   Ht 5\' 1"  (1.549 m)   Wt 132 lb (59.9 kg)   SpO2 98%   BMI 24.94 kg/m     Neurological Exam: MENTAL STATUS including orientation to time,  place, person, recent and remote memory, attention span and concentration, language, and fund of knowledge is normal.  Speech is not mildly dysarthric.  CRANIAL NERVES: II:  No visual field defects.     III-IV-VI: Pupils equal round and reactive to light.  Normal conjugate, extra-ocular eye movements in all directions of gaze.  No nystagmus.  Right ptosis with incomplete eye closure.   V:  Normal facial sensation to vibration, temperature, and pin prick    VII:  Moderate facial diplegia with 3/5 buccinator, 4/5 right orbicularis oculi, 4/5 orbicularis oris, transverse smile.   VIII:  Normal hearing and vestibular function.   IX-X:  Normal palatal movement.   XI:  Normal shoulder shrug and head rotation.   XII:  Tongue strength is 4/5 with slowed side-to-side movements.    MOTOR:  No atrophy, fasciculations or abnormal movements.  No pronator drift.   Upper Extremity:  Right  Left  Deltoid  5/5   5/5   Biceps  5/5   5/5   Triceps  5/5   5/5   Wrist extensors  4/5   4/5   Wrist flexors  4/5   4/5   Finger extensors  4/5   4/5   Finger flexors  4/5   4/5   Dorsal interossei  4/5   4/5   Abductor pollicis  4/5   4/5   Tone (Ashworth scale)  0  0   Lower Extremity:  Right  Left  Hip flexors  4/5   4/5   Hip extensors  5/5   5/5   Knee flexors  5/5   5/5   Knee extensors  5/5   5/5   Dorsiflexors  5-/5   5-/5   Plantarflexors  5-/5   5-/5   Toe extensors  5-/5   5-/5   Toe flexors  5-/5   5-/5   Tone (Ashworth scale)  0  0   MSRs:                                           Right  Left brachioradialis 2+  2+  biceps 2+  2+  triceps 2+  2+  patellar 2+  0  ankle jerk 0  0  Hoffman no  no  plantar response down  down   SENSORY:  Vibration intact at MCP and knees, trace at ankles and absent at the great toe.  Pin prick absent in the limbs throughout.  Temperature intact in the arms, absent in the legs.   COORDINATION/GAIT: Normal finger-to- nose-finger.  Finger tapping is  slowed bilaterally due to weakness.  Gait is assisted with a walker, slow, stable, no dragging of the feet.   Total time spent reviewing records, interview, history/exam, documentation, and coordination of care on day of encounter:   65 min   Thank you for allowing me to participate in patient's care.  If I can answer any additional questions, I would be pleased to do so.    Sincerely,    Adaja Wander K. Allena Katz, DO

## 2023-04-25 NOTE — Progress Notes (Addendum)
NEUROLOGY CONSULT FOLLOW UP NOTE   Date of service: April 25, 2023 Patient Name: Becky Shaw MRN:  [id number] DOB:  04/17/1970  Interval Hx/subjective  She feels that she is improving Vitals   Vitals:   04/24/23 1102 04/24/23 1556 04/24/23 2152 04/25/23 0852  BP: 112/63 118/82 125/66 122/68  Pulse: 80  99 92  Resp: 16 16 18 16   Temp: 97.9 F (36.6 C) 98.4 F (36.9 C) 98.5 F (36.9 C) 98 F (36.7 C)  TempSrc: Oral Oral Oral   SpO2: 100% 100% 99% 99%  Weight:      Height:         Body mass index is 26.24 kg/m.  Physical Exam   General: Awake alert in no distress Neurological exam Awake alert oriented x 3 She appears to have bilateral mild facial weakness, worse on the right, but better than yesterday EOMI     Right  Left Shoulder Abduction  4+/5  4/5 Elbow Flexion   5/5  4+/5 Elbow Extension  4+/5  4/5 Wrist Flexion   5/5  4/5 Wrist Extension  5/5  4-/5 Interossei   5/5  4+/5  Hip Flexion   4+/5  4+/5 Knee Flexion   5/5  4/5 Knee Extension  5/5  5/5 Ankle Dorsiflexion  4+/5  4+/5 Ankle Plantarflexion  4+/5  4+/5     She  has biceps and BR reflexes today, but no patellae or ankle reflexes.   She has decreased proprioception distally in decree sensation to just above the knee  Medications  Current Facility-Administered Medications:    acetaminophen (TYLENOL) tablet 650 mg, 650 mg, Oral, Q6H PRN, Mikey College T, MD   amLODipine (NORVASC) tablet 2.5 mg, 2.5 mg, Oral, Daily, Chipper Herb, Ping T, MD, 2.5 mg at 04/25/23 0850   aspirin EC tablet 81 mg, 81 mg, Oral, Daily, Mikey College T, MD, 81 mg at 04/25/23 0850   atorvastatin (LIPITOR) tablet 10 mg, 10 mg, Oral, Once per day on Monday Thursday, Zhang, Ping T, MD, 10 mg at 04/24/23 3086   bisacodyl (DULCOLAX) EC tablet 10 mg, 10 mg, Oral, Daily, Enedina Finner, MD, 10 mg at 04/25/23 0850   cyclobenzaprine (FLEXERIL) tablet 5-10 mg, 5-10 mg, Oral, BID PRN, Mikey College T, MD, 10 mg at 04/23/23 0911   enoxaparin  (LOVENOX) injection 40 mg, 40 mg, Subcutaneous, Q24H, Chipper Herb, Ping T, MD, 40 mg at 04/25/23 0849   insulin aspart (novoLOG) injection 0-5 Units, 0-5 Units, Subcutaneous, QHS, Enedina Finner, MD, 2 Units at 04/21/23 2207   insulin aspart (novoLOG) injection 0-9 Units, 0-9 Units, Subcutaneous, TID WC, Enedina Finner, MD, 3 Units at 04/24/23 1739   metFORMIN (GLUCOPHAGE-XR) 24 hr tablet 500 mg, 500 mg, Oral, BID WC, Enedina Finner, MD, 500 mg at 04/25/23 0853   ondansetron (ZOFRAN) injection 4 mg, 4 mg, Intravenous, Q6H PRN, Mikey College T, MD   oxyCODONE (Oxy IR/ROXICODONE) immediate release tablet 5 mg, 5 mg, Oral, Q6H PRN, Mikey College T, MD, 5 mg at 04/25/23 0027   polyethylene glycol (MIRALAX / GLYCOLAX) packet 17 g, 17 g, Oral, Daily, Enedina Finner, MD, 17 g at 04/25/23 0849   traZODone (DESYREL) tablet 25 mg, 25 mg, Oral, QHS PRN, Enedina Finner, MD, 25 mg at 04/22/23 2133  Labs and Diagnostic Imaging   CBC:  Recent Labs  Lab 04/19/23 0014 04/20/23 0419  WBC 15.3* 14.1*  HGB 17.0* 16.6*  HCT 47.2* 45.6  MCV 82.7 82.5  PLT 278 250    Basic Metabolic  Panel:  Lab Results  Component Value Date   NA 139 04/25/2023   K 4.0 04/25/2023   CO2 28 04/25/2023   GLUCOSE 122 (H) 04/25/2023   BUN 24 (H) 04/25/2023   CREATININE 0.67 04/25/2023   CALCIUM 9.3 04/25/2023   GFRNONAA >60 04/25/2023   GFRAA >60 03/12/2015   Lipid Panel:  Lab Results  Component Value Date   LDLCALC 115 (H) 04/16/2023   HgbA1c:  Lab Results  Component Value Date   HGBA1C 7.1 (A) 02/10/2023   MR C-spine (personally reviewed): No acute findings.  Normal appearance of the cervical spinal cord.  Mild noncompressive disc bulging at C2-3 through C6-7 without significant stenosis or neural impingement   MRI Brain(Personally reviewed): No acute findings.  Subcentimeter remote lacunar infarct in the posterior aspect of the right lentiform nucleus   B12, TSH normal.  A1c 7.1.  CSF: Color yellow, lab reported xanthochromia.  I  did see the CSF photo sent by Dr. Coletta Memos is in the chart under the media tab, under photo section-looks yellow Glucose 144 Protein greater than 600 RBC tube 1 and 4: 386, 19 WBC tube 1 and 4: 9 and 9, normal distribution of lymphocytes and PMNs. CMV PCR and quantitative HIV are negative  Assessment   Becky Shaw is a 53 y.o. female past history of diabetes, hypertension, hyperlipidemia presenting with about 7 days worth of paresthesias-tingling numbness in the fingers and toes along with heaviness in her tongue and dysphagia.  She had a negative brain and C-spine MRI.  She has a markedly elevated protein with only mild pleocytosis, which does appear to be a dissociation. 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  She has a markedly elevated csf IgG supporting an autoimmune nature of this.   SIADH is a very common association with Guillain-Barr syndrome, so my suspicion is the hyponatremia is likely related.  She appears to be responding well to IVIG and therefore I would continue to treat this as Guillain-Barr syndrome.  With predominance of cranial nerve (had mild diplopia, bilateral facial weakness) and upper extremity symptoms, I think a variant would be a consideration and ganglioside antibodies(including GM and gq1b) are pending.   Recommendations  Completed 5 days of IVIG Continue PT/OT Monitoring and management of electrolytes per primary team. Follow-up with outpatient neurology. ______________________________________________________________________   Ritta Slot, MD [facility] [phone]  If 7pm- 7am, please page neurology on call as listed in AMION.

## 2023-04-25 NOTE — TOC Progression Note (Signed)
Transition of Care [facility]) - Progression Note    Patient Details  Name: Becky Shaw MRN: [id number] Date of Birth: 1970-11-27  Transition of Care [facility]) CM/SW Contact  Marlowe Sax, RN Phone Number: 04/25/2023, 9:46 AM  Clinical Narrative:      The patient was accepted by [facility] for [facility] and OT, she will get a RW delivered to the bedside by [employer]      Expected Discharge Plan and Services         Expected Discharge Date: 04/25/23                                     Social Determinants of Health (SDOH) Interventions SDOH Screenings   Food Insecurity: No Food Insecurity (04/20/2023)  Housing: Patient Declined (04/20/2023)  Transportation Needs: No Transportation Needs (04/19/2023)  Utilities: Not At Risk (04/19/2023)  Depression (PHQ2-9): Low Risk  (04/16/2023)  Tobacco Use: Low Risk  (04/19/2023)    Readmission Risk Interventions     No data to display

## 2023-04-28 ENCOUNTER — Ambulatory Visit (INDEPENDENT_AMBULATORY_CARE_PROVIDER_SITE_OTHER): Payer: 59 | Admitting: Neurology

## 2023-04-28 ENCOUNTER — Telehealth: Payer: Self-pay

## 2023-04-28 ENCOUNTER — Encounter: Payer: Self-pay | Admitting: Neurology

## 2023-04-28 VITALS — BP 130/90 | HR 101 | Ht 61.0 in | Wt 132.0 lb

## 2023-04-28 DIAGNOSIS — G610 Guillain-Barre syndrome: Secondary | ICD-10-CM | POA: Diagnosis not present

## 2023-04-28 MED ORDER — GABAPENTIN 300 MG PO CAPS: ORAL_CAPSULE | ORAL | 5 refills | Status: DC

## 2023-04-28 NOTE — Patient Instructions (Signed)
Start gabapentin 300mg  at bedtime x 3 days then increase to 300mg  twice daily  Nerve testing of the left arm and leg  ELECTROMYOGRAM AND NERVE CONDUCTION STUDIES (EMG/NCS) INSTRUCTIONS  How to Prepare The neurologist conducting the EMG will need to know if you have certain medical conditions. Tell the neurologist and other EMG lab personnel if you: Have a pacemaker or any other electrical medical device Take blood-thinning medications Have hemophilia, a blood-clotting disorder that causes prolonged bleeding Bathing Take a shower or bath shortly before your exam in order to remove oils from your skin. Don't apply lotions or creams before the exam.  What to Expect You'll likely be asked to change into a hospital gown for the procedure and lie down on an examination table. The following explanations can help you understand what will happen during the exam.  Electrodes. The neurologist or a technician places surface electrodes at various locations on your skin depending on where you're experiencing symptoms. Or the neurologist may insert needle electrodes at different sites depending on your symptoms.  Sensations. The electrodes will at times transmit a tiny electrical current that you may feel as a twinge or spasm. The needle electrode may cause discomfort or pain that usually ends shortly after the needle is removed. If you are concerned about discomfort or pain, you may want to talk to the neurologist about taking a short break during the exam.  Instructions. During the needle EMG, the neurologist will assess whether there is any spontaneous electrical activity when the muscle is at rest - activity that isn't present in healthy muscle tissue - and the degree of activity when you slightly contract the muscle.  He or she will give you instructions on resting and contracting a muscle at appropriate times. Depending on what muscles and nerves the neurologist is examining, he or she may ask you to  change positions during the exam.  After your EMG You may experience some temporary, minor bruising where the needle electrode was inserted into your muscle. This bruising should fade within several days. If it persists, contact your primary care doctor.

## 2023-04-28 NOTE — Telephone Encounter (Signed)
Copied from CRM [id number]. Topic: Clinical - Home Health Verbal Orders >> Apr 28, 2023 12:44 PM Adele Barthel wrote: Caller/Agency: [facility] Sheehan Number: [phone] Service Requested: Calling to notify provider patient will start OT and PT on 04/29/2023.  Frequency: N/A Any new concerns about the patient? No

## 2023-04-28 NOTE — Telephone Encounter (Signed)
 Spoke with patient.

## 2023-04-28 NOTE — Transitions of Care (Post Inpatient/ED Visit) (Signed)
   04/28/2023  Name: Becky Shaw MRN: [id number] DOB: Feb 06, 1971  Today's TOC FU Call Status: Today's TOC FU Call Status:: Successful TOC FU Call Completed TOC FU Call Complete Date: 04/28/23 Patient's Name and Date of Birth confirmed.  Transition Care Management Follow-up Telephone Call Date of Discharge: 04/25/23 Discharge Facility: [facility]) Type of Discharge: Inpatient Admission Primary Inpatient Discharge Diagnosis:: dehydrtation How have you been since you were released from the hospital?: Better Any questions or concerns?: No  Items Reviewed: Did you receive and understand the discharge instructions provided?: Yes Medications obtained,verified, and reconciled?: Yes (Medications Reviewed) Any new allergies since your discharge?: No Dietary orders reviewed?: Yes Do you have support at home?: Yes People in Home: spouse  Medications Reviewed Today: Medications Reviewed Today     Reviewed by Karena Addison, LPN (Licensed Practical Nurse) on 04/28/23 at 1415  Med List Status: <None>   Medication Order Taking? Sig Documenting Provider Last Dose Status Informant  amLODipine (NORVASC) 2.5 MG tablet [id number] No Take 1 tablet (2.5 mg total) by mouth daily. Eustaquio Boyden, MD Taking Active   aspirin EC 81 MG tablet [id number] No Take 1 tablet (81 mg total) by mouth daily. Swallow whole. Eustaquio Boyden, MD Taking Active   atorvastatin (LIPITOR) 10 MG tablet [id number] No Take 1 tablet (10 mg total) by mouth 2 (two) times a week. Eustaquio Boyden, MD Taking Active   cyclobenzaprine (FLEXERIL) 10 MG tablet [id number] No Take 0.5-1 tablets (5-10 mg total) by mouth 2 (two) times daily as needed for muscle spasms (sedation precautions). Eustaquio Boyden, MD Taking Active   gabapentin (NEURONTIN) 300 MG capsule [id number]  Take 1 tablet at bedtime x 3 days, then take 1 tablet twice daily. Nita Sickle K, DO  Active   metFORMIN (GLUCOPHAGE-XR) 500 MG 24 hr  tablet [id number] No Take 1 tablet (500 mg total) by mouth daily with breakfast. Eustaquio Boyden, MD Taking Active   oxyCODONE (OXY IR/ROXICODONE) 5 MG immediate release tablet [id number] No Take 1 tablet (5 mg total) by mouth every 6 (six) hours as needed for moderate pain (pain score 4-6). Wouk, Wilfred Curtis, MD Taking Active             Home Care and Equipment/Supplies: Were Home Health Services Ordered?: Yes Name of Home Health Agency:: [facility] Has Agency set up a time to come to your home?: Yes First Home Health Visit Date: 04/28/23 Any new equipment or medical supplies ordered?: NA  Functional Questionnaire: Do you need assistance with bathing/showering or dressing?: Yes Do you need assistance with meal preparation?: No Do you need assistance with eating?: Yes Do you have difficulty maintaining continence: No Do you need assistance with getting out of bed/getting out of a chair/moving?: No Do you have difficulty managing or taking your medications?: No  Follow up appointments reviewed: PCP Follow-up appointment confirmed?: Yes Date of PCP follow-up appointment?: 05/02/23 Follow-up Provider: [facility] Follow-up appointment confirmed?: NA Do you need transportation to your follow-up appointment?: No Do you understand care options if your condition(s) worsen?: Yes-patient verbalized understanding    SIGNATURE Karena Addison, LPN [facility] Nurse Health Advisor Direct Dial [phone]

## 2023-04-28 NOTE — Telephone Encounter (Signed)
Agree with this. Thanks.  

## 2023-04-29 ENCOUNTER — Telehealth: Payer: Self-pay

## 2023-04-29 LAB — MISC LABCORP TEST (SEND OUT): Labcorp test code: 9985

## 2023-04-29 NOTE — Telephone Encounter (Signed)
 Copied from CRM [id number]. Topic: Clinical - Home Health Verbal Orders >> Apr 29, 2023  2:10 PM Adaysia C wrote: Caller/Agency: Almira with [facility] Callback Number: [phone] Service Requested: Physical Therapy Frequency: twice a week for 2 weeks Any new concerns about the patient? No

## 2023-04-29 NOTE — Telephone Encounter (Signed)
Called gave information to [place] she will reach out if any questions

## 2023-04-29 NOTE — Telephone Encounter (Signed)
 Copied from CRM [id number]. Topic: Clinical - Medical Advice >> Apr 29, 2023  2:15 PM Adaysia C wrote: Reason for CRM: Almira(Physical Therapist) has requested an evaluation for speech therapy for this patient. Please follow up with physical therapist if necessary [phone]

## 2023-04-29 NOTE — Telephone Encounter (Signed)
Agree with home health  speech therapy referral for guillain barre syndrome

## 2023-04-29 NOTE — Telephone Encounter (Signed)
Agree with this. Thanks.  

## 2023-04-30 ENCOUNTER — Telehealth: Payer: Self-pay | Admitting: Family Medicine

## 2023-04-30 NOTE — Telephone Encounter (Signed)
 Copied from CRM [id number]. Topic: Clinical - Home Health Verbal Orders >> Apr 30, 2023  3:03 PM Carmell SAUNDERS wrote: Caller/Agency: [facility] Callback Number: [phone] Service Requested: Occupational Therapy Frequency: 1 time a week for 4 weeks Any new concerns about the patient? No

## 2023-04-30 NOTE — Telephone Encounter (Addendum)
 Spoke with Becky Shaw informing her Dr Reece Agar is giving verbal orders for services requested for pt. She expresses her thanks for call back.

## 2023-04-30 NOTE — Telephone Encounter (Signed)
 Agree with this. Thanks.

## 2023-04-30 NOTE — Telephone Encounter (Signed)
Spoke with Becky Shaw informing her Dr Reece Agar is giving verbal orders for services requested for pt. She expresses her thanks for call back.

## 2023-05-01 NOTE — Telephone Encounter (Signed)
 Lvm asking Becky Shaw to call back. Need to let her know Dr Crissie Dome is giving verbal orders for services requested.

## 2023-05-02 ENCOUNTER — Ambulatory Visit: Payer: 59 | Admitting: Family Medicine

## 2023-05-02 ENCOUNTER — Encounter: Payer: Self-pay | Admitting: Family Medicine

## 2023-05-02 VITALS — BP 112/80 | HR 104 | Temp 98.0°F | Ht 61.0 in | Wt 132.5 lb

## 2023-05-02 DIAGNOSIS — E119 Type 2 diabetes mellitus without complications: Secondary | ICD-10-CM

## 2023-05-02 DIAGNOSIS — R209 Unspecified disturbances of skin sensation: Secondary | ICD-10-CM

## 2023-05-02 DIAGNOSIS — H02233 Paralytic lagophthalmos right eye, unspecified eyelid: Secondary | ICD-10-CM

## 2023-05-02 DIAGNOSIS — Z8673 Personal history of transient ischemic attack (TIA), and cerebral infarction without residual deficits: Secondary | ICD-10-CM

## 2023-05-02 DIAGNOSIS — G610 Guillain-Barre syndrome: Secondary | ICD-10-CM | POA: Diagnosis not present

## 2023-05-02 DIAGNOSIS — E1169 Type 2 diabetes mellitus with other specified complication: Secondary | ICD-10-CM

## 2023-05-02 DIAGNOSIS — H02236 Paralytic lagophthalmos left eye, unspecified eyelid: Secondary | ICD-10-CM

## 2023-05-02 DIAGNOSIS — E785 Hyperlipidemia, unspecified: Secondary | ICD-10-CM

## 2023-05-02 MED ORDER — ATORVASTATIN CALCIUM 10 MG PO TABS: 10.0000 mg | ORAL_TABLET | ORAL | Status: DC

## 2023-05-02 NOTE — Telephone Encounter (Signed)
 Copied from CRM [id number]. Topic: General - Other >> May 02, 2023  1:11 PM Suzette B wrote: Reason for CRM: In regards to Verbal Home health orders, Ms. Rosina was returning a call to Ms. Olam, however, Ms. Olam is out to lunch, phone number to return call is [phone],

## 2023-05-02 NOTE — Patient Instructions (Addendum)
 Handicap placard application filled out today  Continue current medicines.  Start using lubricating eye drops regularly. Go see eye doctor if eye irritation develops.  Return in 6-8 weeks for follow up visit

## 2023-05-02 NOTE — Progress Notes (Signed)
 Ph: [phone] Fax: [phone]   Patient ID: Becky Shaw, female    DOB: 1971/02/14, 53 y.o.   MRN: [id number]  This visit was conducted in person.  BP 112/80   Pulse (!) 104   Temp 98 F (36.7 C) (Oral)   Ht 5' 1 (1.549 m)   Wt 132 lb 8 oz (60.1 kg)   SpO2 98%   BMI 25.04 kg/m    CC: hosp f/u visit  Subjective:   HPI: Becky Shaw is a 53 y.o. female presenting on 05/02/2023 for Hospitalization Follow-up (Admitted on 04/19/23 at [facility], dx paresthesias; hyponatremia; hypokalemia; dehydration. Provided [place] [employer] Placard form to complete. Pt accompanied by husband, Thao. )   Recent hospitalization diagnosed with Guillain-Barre syndrome presenting with numbness/tingling progressing to generalized weakness imbalance and falls s/p daily IVIG infusion x5 days.  Hospital records reviewed. Med rec performed.  LP showed elevated protein >600, xanthochromia, elevated CSF IgG.  Lumbar MRI showed diffuse inflammation/thickening of nerves of lumbar spine.  Hyponatremia noted, ACT stimulation test was normal.  Anti-ganglioside antibodies negative (GM1 IgG and IgM)  Recent diagnosis diabetes - started metformin  XR 500mg  daily and tolerating this. Has not been checking sugars.  Lab Results  Component Value Date   HGBA1C 7.1 (A) 02/10/2023    Notes continued tightness/stiffness to fingers and toes. Still feels very weak.  Lips feel numb. She notes both eyes don't fully shut when she's sleeping. Continued numbness to entire back and bottom, sensation is improving to lower legs but notes persistent paresthesias and tightness. Continued abdominal bloating.  She has tried topical turmeric to hands with some benefit.   Home health set up: [facility] PT/OT/ST. Has rolling walker at home  Other follow up appointments scheduled: saw neurology Dr Tobie on 04/28/2023. Ongoing painful paresthesias, loss of temperature, arreflexia, weakness, facial diplegia - started on gabapentin  300mg   BID with planned f/u in 6 wks. Also planned NCS/EMG of L arm/leg.  ______________________________________________________________________ Hospital admission: 04/19/2023 Hospital discharge: 04/25/2023 TCM f/u phone call:  performed on 04/28/2023  Recommendations for Outpatient Follow-up:  PCP f/u Neurology f/u 2/3 as scheduled  F/u acth  stim test results (pending at time of d/c)  Discharge Diagnoses:  Principal Problem:   GBS (Guillain Barre syndrome) (HCC) Active Problems:   Essential hypertension   Type 2 diabetes mellitus without complication, without long-term current use of insulin  (HCC)   Hyponatremia   Neuropathy   Hypokalemia   Discharge Condition: stable Diet recommendation: regular     Relevant past medical, surgical, family and social history reviewed and updated as indicated. Interim medical history since our last visit reviewed. Allergies and medications reviewed and updated. Outpatient Medications Prior to Visit  Medication Sig Dispense Refill   amLODipine  (NORVASC ) 2.5 MG tablet Take 1 tablet (2.5 mg total) by mouth daily. 90 tablet 4   aspirin  EC 81 MG tablet Take 1 tablet (81 mg total) by mouth daily. Swallow whole.     cyclobenzaprine  (FLEXERIL ) 10 MG tablet Take 0.5-1 tablets (5-10 mg total) by mouth 2 (two) times daily as needed for muscle spasms (sedation precautions). 30 tablet 0   gabapentin  (NEURONTIN ) 300 MG capsule Take 1 tablet at bedtime x 3 days, then take 1 tablet twice daily. 60 capsule 5   metFORMIN  (GLUCOPHAGE -XR) 500 MG 24 hr tablet Take 1 tablet (500 mg total) by mouth daily with breakfast. 30 tablet 3   oxyCODONE  (OXY IR/ROXICODONE ) 5 MG immediate release tablet Take 1 tablet (5 mg total) by mouth  every 6 (six) hours as needed for moderate pain (pain score 4-6). 10 tablet 0   atorvastatin  (LIPITOR) 10 MG tablet Take 1 tablet (10 mg total) by mouth 2 (two) times a week.     No facility-administered medications prior to visit.     Per HPI unless  specifically indicated in ROS section below Review of Systems  Objective:  BP 112/80   Pulse (!) 104   Temp 98 F (36.7 C) (Oral)   Ht 5' 1 (1.549 m)   Wt 132 lb 8 oz (60.1 kg)   SpO2 98%   BMI 25.04 kg/m   Wt Readings from Last 3 Encounters:  05/02/23 132 lb 8 oz (60.1 kg)  04/28/23 132 lb (59.9 kg)  04/19/23 138 lb 14.2 oz (63 kg)      Physical Exam Vitals and nursing note reviewed.  Constitutional:      Comments: Tired appearing, ambulates with walker   HENT:     Head: Normocephalic and atraumatic.     Mouth/Throat:     Mouth: Mucous membranes are moist.     Pharynx: Oropharynx is clear. No oropharyngeal exudate or posterior oropharyngeal erythema.  Eyes:     General: No scleral icterus.    Extraocular Movements: Extraocular movements intact.     Conjunctiva/sclera: Conjunctivae normal.     Pupils: Pupils are equal, round, and reactive to light.  Cardiovascular:     Rate and Rhythm: Normal rate and regular rhythm.     Pulses: Normal pulses.     Heart sounds: Normal heart sounds. No murmur heard. Pulmonary:     Effort: Pulmonary effort is normal. No respiratory distress.     Breath sounds: Normal breath sounds. No wheezing, rhonchi or rales.  Skin:    General: Skin is warm and dry.     Capillary Refill: Capillary refill takes less than 2 seconds.     Comments: Yellow color to extremities from turmeric powder use  Neurological:     Mental Status: She is alert.     Motor: Weakness present.     Coordination: Coordination abnormal.     Gait: Gait abnormal.     Comments:  Symmetrical facial weakness present around eyes and mouth Sensation to light touch intact throughout body  4/5 strength LUE, LLE 5/5 strength RUE, RLE  Psychiatric:        Mood and Affect: Mood normal.        Behavior: Behavior normal.       Results for orders placed or performed in visit on 04/30/23  HM DIABETES EYE EXAM   Collection Time: 03/06/23  9:39 AM  Result Value Ref Range   HM  Diabetic Eye Exam No Retinopathy No Retinopathy   Lab Results  Component Value Date   TSH 2.54 04/14/2023    Lab Results  Component Value Date   WBC 14.1 (H) 04/20/2023   HGB 16.6 (H) 04/20/2023   HCT 45.6 04/20/2023   MCV 82.5 04/20/2023   PLT 250 04/20/2023    Lab Results  Component Value Date   NA 139 04/25/2023   CL 101 04/25/2023   K 4.0 04/25/2023   CO2 28 04/25/2023   BUN 24 (H) 04/25/2023   CREATININE 0.67 04/25/2023   GFRNONAA >60 04/25/2023   CALCIUM  9.3 04/25/2023   PHOS 3.1 04/19/2023   ALBUMIN 4.7 04/19/2023   GLUCOSE 122 (H) 04/25/2023    Lab Results  Component Value Date   ALT 30 04/16/2023   AST 19 04/16/2023  ALKPHOS 96 04/16/2023   BILITOT 1.7 (H) 04/16/2023     Assessment & Plan:   Problem List Items Addressed This Visit     Hyperlipidemia associated with type 2 diabetes mellitus (HCC)   Continue atorvastatin  3x/wk. H/o high Lp(a), new diabetes      Relevant Medications   atorvastatin  (LIPITOR) 10 MG tablet   Type 2 diabetes mellitus without complication, without long-term current use of insulin  (HCC)   Notes GI upset she attributed to GBS however with recent metformin  XR start, may be due to this med. Suggested trial off metformin  for a few weeks and monitor GI symptoms, may restart to retrial after this.       Relevant Medications   atorvastatin  (LIPITOR) 10 MG tablet   History of stroke in adulthood   H/o remote lacunar R posterior lentiform nucleus continue aspirin , statin.       Disturbance of skin sensation   GBS (Guillain Barre syndrome) (HCC) - Primary   Diagnosis based on progressive motor and sensory neuropathy with LP showing high protein and IgG.  S/p treatment with IVIG x5d in hospital, slowed improvement.  Appreciate neurology care - planned NCS/EMG next month for further characterization. Has been started on gabapentin  for nerve pain, currently on 300mg  BID with some benefit noted. Handicap placard application filled out  for patient x41mo.  She remains out of work.       Eyelid closing impairment   Sequelae of GBS noted at night.  Discussed eye care - rec frequent use of lubricating drops throughout the day, consideration for lubricating ointment + eye taping at night.  Low threshold to return to ophtho if not better or any signs of eye irritation from this.         Meds ordered this encounter  Medications   atorvastatin  (LIPITOR) 10 MG tablet    Sig: Take 1 tablet (10 mg total) by mouth every Monday, Wednesday, and Friday.    No orders of the defined types were placed in this encounter.   Patient Instructions  Handicap placard application filled out today  Continue current medicines.  Start using lubricating eye drops regularly. Go see eye doctor if eye irritation develops.  Return in 6-8 weeks for follow up visit   Follow up plan: Return in about 6 weeks (around 06/13/2023), or if symptoms worsen or fail to improve, for follow up visit.  Anton Blas, MD

## 2023-05-02 NOTE — Telephone Encounter (Signed)
 Called Ashley at number below orders given. She will reach out if anything further needed at this time.

## 2023-05-03 DIAGNOSIS — H02209 Unspecified lagophthalmos unspecified eye, unspecified eyelid: Secondary | ICD-10-CM | POA: Insufficient documentation

## 2023-05-03 NOTE — Assessment & Plan Note (Addendum)
 Sequelae of GBS noted at night.  Discussed eye care - rec frequent use of lubricating drops throughout the day, consideration for lubricating ointment + eye taping at night.  Low threshold to return to ophtho if not better or any signs of eye irritation from this.

## 2023-05-03 NOTE — Assessment & Plan Note (Signed)
 H/o remote lacunar R posterior lentiform nucleus continue aspirin , statin.

## 2023-05-03 NOTE — Assessment & Plan Note (Addendum)
 Continue atorvastatin  3x/wk. H/o high Lp(a), new diabetes

## 2023-05-03 NOTE — Assessment & Plan Note (Addendum)
 Diagnosis based on progressive motor and sensory neuropathy with LP showing high protein and IgG.  S/p treatment with IVIG x5d in hospital, slowed improvement.  Appreciate neurology care - planned NCS/EMG next month for further characterization. Has been started on gabapentin  for nerve pain, currently on 300mg  BID with some benefit noted. Handicap placard application filled out for patient x2mo.  She remains out of work.

## 2023-05-03 NOTE — Assessment & Plan Note (Signed)
 Notes GI upset she attributed to GBS however with recent metformin  XR start, may be due to this med. Suggested trial off metformin  for a few weeks and monitor GI symptoms, may restart to retrial after this.

## 2023-05-07 LAB — OLIGOCLONAL BANDS, CSF + SERM

## 2023-05-09 ENCOUNTER — Encounter: Payer: Self-pay | Admitting: Neurology

## 2023-05-09 ENCOUNTER — Other Ambulatory Visit: Payer: Self-pay | Admitting: Neurology

## 2023-05-09 DIAGNOSIS — I10 Essential (primary) hypertension: Secondary | ICD-10-CM

## 2023-05-09 DIAGNOSIS — G610 Guillain-Barre syndrome: Secondary | ICD-10-CM

## 2023-05-09 DIAGNOSIS — R270 Ataxia, unspecified: Secondary | ICD-10-CM | POA: Diagnosis not present

## 2023-05-09 DIAGNOSIS — E876 Hypokalemia: Secondary | ICD-10-CM

## 2023-05-09 DIAGNOSIS — E871 Hypo-osmolality and hyponatremia: Secondary | ICD-10-CM

## 2023-05-09 DIAGNOSIS — R1310 Dysphagia, unspecified: Secondary | ICD-10-CM | POA: Diagnosis not present

## 2023-05-09 DIAGNOSIS — M792 Neuralgia and neuritis, unspecified: Secondary | ICD-10-CM

## 2023-05-09 DIAGNOSIS — E1142 Type 2 diabetes mellitus with diabetic polyneuropathy: Secondary | ICD-10-CM | POA: Diagnosis not present

## 2023-05-09 MED ORDER — GABAPENTIN 300 MG PO CAPS: ORAL_CAPSULE | ORAL | 5 refills | Status: DC

## 2023-05-30 ENCOUNTER — Ambulatory Visit: Payer: 59 | Admitting: Neurology

## 2023-05-30 DIAGNOSIS — G610 Guillain-Barre syndrome: Secondary | ICD-10-CM | POA: Diagnosis not present

## 2023-05-30 NOTE — Procedures (Signed)
 [facility]  [address] [place], [address]  [place], [place] [address] Tel: [phone] Fax: [phone] Test Date:  05/30/2023  Patient: Becky Shaw DOB: 1970/05/17 Physician: Nita Sickle, DO  Sex: Female Height: 5\' 1"  Ref Phys: Nita Sickle, DO  ID#: [id number]   Technician:    History: This is a 53 year old female with Guillain-Barr syndrome referred for evaluation of generalized weakness.  NCV & EMG Findings: Extensive electrodiagnostic testing of the left upper and lower extremity shows:  Left median, ulnar, mixed palmar, sural, and superficial peroneal sensory responses are within normal limits. Left median, ulnar, peroneal (tibialis anterior), and tibial motor responses are within normal limits.  Left peroneal motor response at the extensor digitorum brevis is mildly reduced.  Specifically, there is no evidence of conduction block, prolonged latency, or temporal dispersion. Late responses including left ulnar and tibial F waves are within normal limits.  Left tibial H reflex study is within normal limits. There is no evidence of active or chronic motor axonal loss changes affecting any of the tested muscles.  Motor unit configuration is within normal limits.  Few muscles in the left lower extremity below the knee show reduced recruitment.  Impression: This is a normal study of the left upper and lower extremities.  In particular, there is no evidence of a large fiber sensorimotor polyneuropathy or cervical/lumbosacral radiculopathy.   ___________________________ Nita Sickle, DO    Nerve Conduction Studies   Stim Site NR Peak (ms) Norm Peak (ms) O-P Amp (V) Norm O-P Amp  Left Median Anti Sensory (2nd Digit)  32 C  Wrist    2.8 <3.6 78.8 >15  Left Sup Peroneal Anti Sensory (Ant Lat Mall)  32 C  12 cm    2.2 <4.6 27.4 >4  Left Sural Anti Sensory (Lat Mall)  32 C  Calf    2.9 <4.6 25.3 >4  Left Ulnar Anti Sensory (5th Digit)  32 C  Wrist    2.7 <3.1 69.3  >10     Stim Site NR Onset (ms) Norm Onset (ms) O-P Amp (mV) Norm O-P Amp Site1 Site2 Delta-0 (ms) Dist (cm) Vel (m/s) Norm Vel (m/s)  Left Median Motor (Abd Poll Brev)  32 C  Wrist    3.5 <4.0 8.0 >6 Elbow Wrist 4.6 29.0 63 >50  Elbow    8.1  7.3         Left Peroneal Motor (Ext Dig Brev)  32 C  Ankle    5.8 <6.0 *1.4 >2.5 B Fib Ankle 5.5 33.0 60 >40  B Fib    11.3  1.4  Poplt B Fib 1.7 10.0 59 >40  Poplt    13.0  1.4         Left Peroneal TA Motor (Tib Ant)  32 C  Fib Head    2.7 <4.5 5.6 >3 Poplit Fib Head 1.3 8.0 62 >40  Poplit    4.0 <5.7 5.5         Left Tibial Motor (Abd Hall Brev)  32 C  Ankle    4.1 <6.0 5.4 >4 Knee Ankle 8.9 39.0 44 >40  Knee    13.0  4.4         Left Ulnar Motor (Abd Dig Minimi)  32 C  Wrist    2.8 <3.1 8.4 >7 B Elbow Wrist 3.7 22.0 59 >50  B Elbow    6.5  7.5  A Elbow B Elbow 1.7 10.0 59 >50  A Elbow  8.2  7.0            Stim Site NR Peak (ms) Norm Peak (ms) P-T Amp (V) Site1 Site2 Delta-P (ms) Norm Delta (ms)  Left Median/Ulnar Palm Comparison (Wrist - 8cm)  32 C  Median Palm    1.5 <2.2 77.4 Median Palm Ulnar Palm 0.2   Ulnar Palm    1.3 <2.2 32.0       Electromyography   Side Muscle Ins.Act Fibs Fasc Recrt Amp Dur Poly Activation Comment  Left 1stDorInt Nml Nml Nml Nml Nml Nml Nml Nml N/A  Left PronatorTeres Nml Nml Nml Nml Nml Nml Nml Nml N/A  Left Biceps Nml Nml Nml Nml Nml Nml Nml Nml N/A  Left Triceps Nml Nml Nml Nml Nml Nml Nml Nml N/A  Left Deltoid Nml Nml Nml Nml Nml Nml Nml Nml N/A  Left AntTibialis Nml Nml Nml *1- Nml Nml Nml *Variable N/A  Left Gastroc Nml Nml Nml *1- Nml Nml Nml *Variable N/A  Left Flex Dig Long Nml Nml Nml *1- Nml Nml Nml *Variable N/A  Left BicepsFemS Nml Nml Nml Nml Nml Nml Nml Nml N/A  Left RectFemoris Nml Nml Nml Nml Nml Nml Nml Nml N/A  Left GluteusMed Nml Nml Nml Nml Nml Nml Nml Nml N/A  Left Lumbo Parasp Low Nml Nml Nml Nml Nml Nml Nml Nml N/A      Waveforms:

## 2023-06-11 ENCOUNTER — Ambulatory Visit: Payer: 59 | Admitting: Neurology

## 2023-06-11 ENCOUNTER — Encounter: Payer: Self-pay | Admitting: Neurology

## 2023-06-11 VITALS — BP 153/96 | HR 90 | Ht 61.0 in | Wt 133.0 lb

## 2023-06-11 DIAGNOSIS — R2681 Unsteadiness on feet: Secondary | ICD-10-CM | POA: Diagnosis not present

## 2023-06-11 DIAGNOSIS — G610 Guillain-Barre syndrome: Secondary | ICD-10-CM | POA: Diagnosis not present

## 2023-06-11 NOTE — Patient Instructions (Signed)
 Start out-patient physical therapy  Continue gabapentin 600mg  at bedtime

## 2023-06-11 NOTE — Progress Notes (Signed)
 Follow-up Visit   Date: 06/11/2023    Becky Shaw MRN: [id number] DOB: [birth year]    Becky Shaw is a 53 y.o. right-handed female with diabetes mellitus, hypertension, hyperlipidemia returning to the clinic for follow-up of Guillain-Barre syndrome.  The patient was accompanied to the clinic by self.  IMPRESSION/PLAN: Guillain-Barre syndrome manifesting with progressive paresthesias, generalized weakness, and bulbar weakness (Jan 2024), supported by CSF findings (protein > 600) and MRI lumbar spine showing diffuse nerve root enhancement.  She was treated with IVIG and made significant improvements.  Exam shows mild weakness in the legs and distal sensory loss.  Reflexes are present at the knees and trace at the ankles (improved).  NCS/EMG was reviewed and is normal indicating that she has a very good prognosis for recovery, but that it can take months for complete resolution of symptoms.  - Continue gabapentin 600mg  at bedtime  - Start physical therapy for balance training  Return to clinic in 3 months  --------------------------------------------- History of present illness: Starting on January 17th, she began having numbness/tingling of the toes and over the next few days, she began having more intense symptoms in the hands, mouth, tongue, and back.  She was also having generalized weakness.  She went to the ER and was recommended out-patient follow-up with neurology.  A few days later, she became so weak that she was unable to walk, which prompted her to go to [facility].     She was evaluated by neurology who had high clinical suspicion for GBS and CSF testing confirmed the presence of albuminocytologic dissociation (CSF protein > 600), MRI lumbar spine with nerve root enhancement.  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  She was treated with IVIG 2g/kg over 5 days.  She has been more aware of temperature and tingling is less.  She continues to have diffuse  numbness and weakness.  She also has facial weakness with incomplete eyeclosure on the right and is unable to smile. She is walking with a walker.     She works as a Research scientist (medical) at [employer].  She lives with husband and 24 year old son.  She does not smoke or drink alcohol.   UPDATE 06/11/2023:  She is here for follow-up visit.  She has completed home PT/OT and able to walk unassisted, but still is cautious because of imbalance.  She is not able to walk as fast as she previously could.  Her strength is improving.  She is able to swallow better and arms are stronger.  She tried to go to her office to work but noticed that typing and opening envelopes creates increased sensitivity of the fingertips and tingling.  She continues to have numbness in her back and feet.   Medications:  Current Outpatient Medications on File Prior to Visit  Medication Sig Dispense Refill   amLODipine (NORVASC) 2.5 MG tablet Take 1 tablet (2.5 mg total) by mouth daily. 90 tablet 4   aspirin EC 81 MG tablet Take 1 tablet (81 mg total) by mouth daily. Swallow whole.     atorvastatin (LIPITOR) 10 MG tablet Take 1 tablet (10 mg total) by mouth every Monday, Wednesday, and Friday.     gabapentin (NEURONTIN) 300 MG capsule Take 1 tablet (300 mg) in the morning and take 2 tablets (600 mg) at bedtime 90 capsule 5   metFORMIN (GLUCOPHAGE-XR) 500 MG 24 hr tablet Take 1 tablet (500 mg total) by mouth daily with breakfast. 30 tablet 3   oxyCODONE (  OXY IR/ROXICODONE) 5 MG immediate release tablet Take 1 tablet (5 mg total) by mouth every 6 (six) hours as needed for moderate pain (pain score 4-6). 10 tablet 0   No current facility-administered medications on file prior to visit.    Allergies:  Allergies  Allergen Reactions   Cetirizine Hcl     REACTION: insomnia    Vital Signs:  BP (!) 153/96   Pulse 90   Ht 5\' 1"  (1.549 m)   Wt 133 lb (60.3 kg)   SpO2 100%   BMI 25.13 kg/m   Neurological Exam: MENTAL STATUS including  orientation to time, place, person, recent and remote memory, attention span and concentration, language, and fund of knowledge is normal.  Speech is not dysarthric.  CRANIAL NERVES:  No visual field defects.  Pupils equal round and reactive to light.  Normal conjugate, extra-ocular eye movements in all directions of gaze.  No ptosis. Mild periorbital edema.  Face is symmetric, mild facial weakness with smile.  Buccinator, orbicularis oculi and oris is 5/5. Palate elevates symmetrically.  Tongue is midline and strength is 5/5.  MOTOR:  No atrophy, fasciculations or abnormal movements.  No pronator drift.   Upper Extremity:  Right  Left  Deltoid  5/5   5/5   Biceps  5/5   5/5   Triceps  5/5   5/5   Wrist extensors  5/5   5/5   Wrist flexors  5/5   5/5   Finger extensors  5/5   5/5   Finger flexors  5/5   5/5   Dorsal interossei  5/5   5/5   Abductor pollicis  5/5   5/5   Tone (Ashworth scale)  0  0   Lower Extremity:  Right  Left  Hip flexors  4/5   4/5   Knee flexors  5/5   5/5   Knee extensors  5/5   5/5   Dorsiflexors  5-/5   5-/5   Plantarflexors  5-/5   5-/5   Toe extensors  5-/5   5-/5   Toe flexors  5-/5   5-/5   Tone (Ashworth scale)  0  0   MSRs:                                           Right        Left brachioradialis 2+  2+  biceps 2+  2+  triceps 2+  2+  patellar 2+  2+  ankle jerk tr  tr   SENSORY:  Intact to vibration throughout.  Reduced temperature at the feet.  COORDINATION/GAIT:   Gait mildly wide-based and stable, unassisted, slow.    Data: NCS/EMG of the left side 05/30/2023: This is a normal study of the left upper and lower extremities.  In particular, there is no evidence of a large fiber sensorimotor polyneuropathy or cervical/lumbosacral radiculopathy.   MRI lumbar spine 04/20/2023: 1. Diffuse thickening and enhancement of the nerve roots of the cauda equina. This is consistent with the clinical diagnosis of Guillain-Barre syndrome. The  differential diagnosis includes an acute presentation of CIDP. 2. No significant disc protrusion or stenosis in the lumbar spine.   MRI cervical spine 04/15/2023: 1. Normal MRI appearance of the cervical spinal cord. 2. Mild noncompressive disc bulging at C2-3 through C6-7 without significant stenosis or overt neural impingement.   MRI  brain wo contrast 04/15/2023: 1. No acute intracranial abnormality. 2. Subcentimeter remote lacunar infarct at the posterior aspect of the right lentiform nucleus. 3. Otherwise normal brain MRI.   CSF 04/19/2023:  R19 R9 P>600 G144 encephalitis panel neg, IgG index - unable to be calculated  Thank you for allowing me to participate in patient's care.  If I can answer any additional questions, I would be pleased to do so.    Sincerely,    Africa Masaki K. Allena Katz, DO

## 2023-06-13 ENCOUNTER — Encounter: Payer: Self-pay | Admitting: Family Medicine

## 2023-06-13 ENCOUNTER — Ambulatory Visit: Payer: 59 | Admitting: Family Medicine

## 2023-06-13 VITALS — BP 116/82 | HR 98 | Temp 98.2°F | Ht 61.0 in | Wt 132.4 lb

## 2023-06-13 DIAGNOSIS — G610 Guillain-Barre syndrome: Secondary | ICD-10-CM | POA: Diagnosis not present

## 2023-06-13 DIAGNOSIS — Z7984 Long term (current) use of oral hypoglycemic drugs: Secondary | ICD-10-CM

## 2023-06-13 DIAGNOSIS — E119 Type 2 diabetes mellitus without complications: Secondary | ICD-10-CM | POA: Diagnosis not present

## 2023-06-13 LAB — MICROALBUMIN / CREATININE URINE RATIO
Creatinine,U: 74.5 mg/dL
Microalb Creat Ratio: 17.7 mg/g (ref 0.0–30.0)
Microalb, Ur: 1.3 mg/dL (ref 0.0–1.9)

## 2023-06-13 MED ORDER — METFORMIN HCL ER 500 MG PO TB24: 500.0000 mg | ORAL_TABLET | Freq: Every day | ORAL | 1 refills | Status: DC

## 2023-06-13 NOTE — Patient Instructions (Addendum)
 Check with pharmacy on preferred glucometer brand and let me know.  I'm glad we're seeing daily improvement!  Good to see you today  Keep appointment for physical in June.  Urine test today

## 2023-06-13 NOTE — Assessment & Plan Note (Signed)
 Appreciate neurology care Dr Allena Katz.  She fortunately notes significant improvement since last appointment.  Continue course, continue gabapentin 600mg  nigntly with plan for slow taper as tolerated. Planned outpatient PT for balance training - referred by neurology.

## 2023-06-13 NOTE — Progress Notes (Signed)
 Ph: [phone] Fax: [phone]   Patient ID: Becky Shaw, female    DOB: October 01, 1970, 53 y.o.   MRN: [id number]  This visit was conducted in person.  BP 116/82   Pulse 98   Temp 98.2 F (36.8 C) (Oral)   Ht 5\' 1"  (1.549 m)   Wt 132 lb 6 oz (60 kg)   SpO2 96%   BMI 25.01 kg/m    CC: 6 wk f/u visit  Subjective:   HPI: Becky Shaw is a 53 y.o. female presenting on 06/13/2023 for Medical Management of Chronic Issues (Here for 6 wk f/u.)   See prior note for details.  Recent dx GBS s/p IVIG infusions x5d in hospital late 03/2023.  Seeing [facility] neuro Dr Allena Katz. Continues gabapentin 600mg  daily.  L extremity NCS/EMG 05/2023 showed overall reassuringly normal study without evidence of large fiber polyneuropathy or cervical/lumbosacral radiculopathy.  Completed [facility] home health 2 wks ago. Now walking independently but still slowed. Referred to outpatient PT for balance training (off [place]).  Notes persistent paresthesias and numbness to hands and low back into buttock.  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   Recent DM dx - does check sugars - 132, 144 fasting this week. Compliant with antihyperglycemic regimen which includes: metformin XR 500mg  daily. Denies low sugars or hypoglycemic symptoms. Denies blurry vision. Paresthesias as per above. Last diabetic eye exam 02/2023. Glucometer brand: Relion. Last foot exam: 01/2023. DSME: not completed.  Lab Results  Component Value Date   HGBA1C 7.1 (A) 02/10/2023   Lab Results  Component Value Date   VITAMINB12 605 04/14/2023    Diabetic Foot Exam - Simple   No data filed    Lab Results  Component Value Date   MICROALBUR 2.0 (H) 12/07/2021        Relevant past medical, surgical, family and social history reviewed and updated as indicated. Interim medical history since our last visit reviewed. Allergies and medications reviewed and updated. Outpatient  Medications Prior to Visit  Medication Sig Dispense Refill   amLODipine (NORVASC) 2.5 MG tablet Take 1 tablet (2.5 mg total) by mouth daily. 90 tablet 4   aspirin EC 81 MG tablet Take 1 tablet (81 mg total) by mouth daily. Swallow whole.     atorvastatin (LIPITOR) 10 MG tablet Take 1 tablet (10 mg total) by mouth every Monday, Wednesday, and Friday.     gabapentin (NEURONTIN) 300 MG capsule Take 1 tablet (300 mg) in the morning and take 2 tablets (600 mg) at bedtime 90 capsule 5   metFORMIN (GLUCOPHAGE-XR) 500 MG 24 hr tablet Take 1 tablet (500 mg total) by mouth daily with breakfast. 30 tablet 3   gabapentin (NEURONTIN) 300 MG capsule Take 2 capsules (600 mg total) by mouth at bedtime.     oxyCODONE (OXY IR/ROXICODONE) 5 MG immediate release tablet Take 1 tablet (5 mg total) by mouth every 6 (six) hours as needed for moderate pain (pain score 4-6). 10 tablet 0   No facility-administered medications prior to visit.     Per HPI unless specifically indicated in ROS section below Review of Systems  Objective:  BP 116/82   Pulse 98   Temp 98.2 F (36.8 C) (Oral)   Ht 5\' 1"  (1.549 m)   Wt 132 lb 6 oz (60 kg)   SpO2 96%   BMI 25.01 kg/m   Wt Readings from Last 3 Encounters:  06/13/23 132 lb 6  oz (60 kg)  06/11/23 133 lb (60.3 kg)  05/02/23 132 lb 8 oz (60.1 kg)      Physical Exam Vitals and nursing note reviewed.  Constitutional:      Appearance: Normal appearance. She is not ill-appearing.  HENT:     Mouth/Throat:     Mouth: Mucous membranes are moist.     Pharynx: Oropharynx is clear. No oropharyngeal exudate or posterior oropharyngeal erythema.  Eyes:     Extraocular Movements: Extraocular movements intact.     Conjunctiva/sclera: Conjunctivae normal.     Pupils: Pupils are equal, round, and reactive to light.     Comments: Swelling to bilateral upper eyelids  Cardiovascular:     Rate and Rhythm: Normal rate and regular rhythm.     Pulses: Normal pulses.     Heart sounds:  Normal heart sounds. No murmur heard. Pulmonary:     Effort: Pulmonary effort is normal. No respiratory distress.     Breath sounds: Normal breath sounds. No wheezing, rhonchi or rales.  Musculoskeletal:        General: Swelling present.     Right lower leg: No edema.     Left lower leg: No edema.     Comments: Mild swelling to distal fingers of bilateral hands  Neurological:     Mental Status: She is alert.  Psychiatric:        Mood and Affect: Mood normal.        Behavior: Behavior normal.       Results for orders placed or performed in visit on 04/30/23  HM DIABETES EYE EXAM   Collection Time: 03/06/23  9:39 AM  Result Value Ref Range   HM Diabetic Eye Exam No Retinopathy No Retinopathy   Lab Results  Component Value Date   TSH 2.54 04/14/2023    Lab Results  Component Value Date   NA 139 04/25/2023   CL 101 04/25/2023   K 4.0 04/25/2023   CO2 28 04/25/2023   BUN 24 (H) 04/25/2023   CREATININE 0.67 04/25/2023   GFRNONAA >60 04/25/2023   CALCIUM 9.3 04/25/2023   PHOS 3.1 04/19/2023   ALBUMIN 4.7 04/19/2023   GLUCOSE 122 (H) 04/25/2023   Lab Results  Component Value Date   WBC 14.1 (H) 04/20/2023   HGB 16.6 (H) 04/20/2023   HCT 45.6 04/20/2023   MCV 82.5 04/20/2023   PLT 250 04/20/2023    Assessment & Plan:   Problem List Items Addressed This Visit     Type 2 diabetes mellitus without complication, without long-term current use of insulin (HCC)   Chronic, recent cbg's elevated. Continue metformin XR 500mg  daily.  Endorses overall healthy diet.  Check Umicroalb today. Reassess control at 3 mo CPE.       Relevant Medications   metFORMIN (GLUCOPHAGE-XR) 500 MG 24 hr tablet   Other Relevant Orders   Microalbumin / creatinine urine ratio   GBS (Guillain Barre syndrome) [facility]) - Primary   Appreciate neurology care Dr Allena Katz.  She fortunately notes significant improvement since last appointment.  Continue course, continue gabapentin 600mg  nigntly with plan  for slow taper as tolerated. Planned outpatient PT for balance training - referred by neurology.       Relevant Medications   gabapentin (NEURONTIN) 300 MG capsule     Meds ordered this encounter  Medications   metFORMIN (GLUCOPHAGE-XR) 500 MG 24 hr tablet    Sig: Take 1 tablet (500 mg total) by mouth daily with breakfast.  Dispense:  90 tablet    Refill:  1    Orders Placed This Encounter  Procedures   Microalbumin / creatinine urine ratio    Patient Instructions  Check with pharmacy on preferred glucometer brand and let me know.  I'm glad we're seeing daily improvement!  Good to see you today  Keep appointment for physical in June.  Urine test today   Follow up plan: No follow-ups on file.  Eustaquio Boyden, MD

## 2023-06-13 NOTE — Assessment & Plan Note (Signed)
 Chronic, recent cbg's elevated. Continue metformin XR 500mg  daily.  Endorses overall healthy diet.  Check Umicroalb today. Reassess control at 3 mo CPE.

## 2023-06-23 ENCOUNTER — Telehealth: Payer: Self-pay | Admitting: Neurology

## 2023-06-23 DIAGNOSIS — M792 Neuralgia and neuritis, unspecified: Secondary | ICD-10-CM

## 2023-06-23 DIAGNOSIS — R2681 Unsteadiness on feet: Secondary | ICD-10-CM

## 2023-06-23 DIAGNOSIS — G610 Guillain-Barre syndrome: Secondary | ICD-10-CM

## 2023-06-23 NOTE — Telephone Encounter (Signed)
 Called patient and informed her that I have sent a referral for her to [facility] due to [facility] my taking her insurance. Patient thanked me for the call and had no further questions or concerns.

## 2023-06-23 NOTE — Telephone Encounter (Signed)
 Pt. Calling as OPPT Location not covered by Insurance, can you contact with options covered

## 2023-06-30 ENCOUNTER — Ambulatory Visit: Admitting: Physical Therapy

## 2023-06-30 ENCOUNTER — Ambulatory Visit: Attending: Neurology | Admitting: Physical Therapy

## 2023-06-30 DIAGNOSIS — R2681 Unsteadiness on feet: Secondary | ICD-10-CM | POA: Diagnosis present

## 2023-06-30 DIAGNOSIS — M6281 Muscle weakness (generalized): Secondary | ICD-10-CM | POA: Insufficient documentation

## 2023-06-30 DIAGNOSIS — R2689 Other abnormalities of gait and mobility: Secondary | ICD-10-CM | POA: Diagnosis present

## 2023-06-30 DIAGNOSIS — M792 Neuralgia and neuritis, unspecified: Secondary | ICD-10-CM | POA: Insufficient documentation

## 2023-06-30 DIAGNOSIS — G610 Guillain-Barre syndrome: Secondary | ICD-10-CM | POA: Insufficient documentation

## 2023-06-30 DIAGNOSIS — R278 Other lack of coordination: Secondary | ICD-10-CM | POA: Diagnosis present

## 2023-06-30 NOTE — Therapy (Signed)
 OUTPATIENT PHYSICAL THERAPY NEURO EVALUATION   Patient Name: Becky Shaw MRN: [id number] DOB:06-25-1970, 53 y.o., female Today's Date: 06/30/2023   PCP: Eustaquio Boyden, MD REFERRING PROVIDER: Glendale Chard, DO  END OF SESSION:  PT End of Session - 06/30/23 1355     Visit Number 1    Number of Visits 9   with eval   Date for PT Re-Evaluation 08/11/23    Authorization Type [facility]    PT Start Time 1355    PT Stop Time 1435    PT Time Calculation (min) 40 min    Equipment Utilized During Treatment Gait belt    Activity Tolerance Patient tolerated treatment well    Behavior During Therapy [facility] for tasks assessed/performed             Past Medical History:  Diagnosis Date   ALLERGIC RHINITIS 05/17/2007   GANGLION CYST, WRIST, RIGHT 05/31/2008   Hyperlipidemia    OVERACTIVE BLADDER 05/15/2007   Prediabetes    Past Surgical History:  Procedure Laterality Date   CHOLECYSTECTOMY     Patient Active Problem List   Diagnosis Date Noted   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   Neuropathy 04/19/2023   GBS (Guillain Barre syndrome) (HCC) 04/19/2023   Thyroid nodule 04/17/2023   Postmenopausal 04/17/2023   Back pain 04/17/2023   History of stroke in adulthood 04/16/2023   Disturbance of skin sensation 04/16/2023   Elevated blood protein 04/16/2023   Paresthesias 04/14/2023   Travel advice encounter 09/16/2022   Menopausal state 09/16/2022   Borderline hypothyroidism 03/18/2022   Family history of stroke (cerebrovascular) 03/08/2022   Type 2 diabetes mellitus without complication, without long-term current use of insulin (HCC) 12/07/2021   Dermatitis of both ear canals 08/29/2021   Hyperlipidemia associated with type 2 diabetes mellitus (HCC) 06/26/2020   Hives 10/18/2015   Essential hypertension 03/22/2015   Aphthous ulcer of mouth 12/16/2011   Health maintenance examination 12/07/2010   Allergic rhinitis 05/17/2007    ONSET DATE: 06/23/2023 (referral  date, symptom onset 04/11/2023)  REFERRING DIAG: G61.0 (ICD-10-CM) - GBS (Guillain Barre syndrome) (HCC) R26.81 (ICD-10-CM) - Unsteady gait M79.2 (ICD-10-CM) - Neuropathic pain  THERAPY DIAG:  Muscle weakness (generalized)  Other abnormalities of gait and mobility  Unsteadiness on feet  Other lack of coordination  Rationale for Evaluation and Treatment: Rehabilitation  SUBJECTIVE:                                                                                                                                                                                             SUBJECTIVE STATEMENT: "Becky Shaw"  Pt went to the hospital  ED several times and her PCP before she was finally diagnosed with GBS. Since returning home from the hospital patient did about 4 weeks of HHPT. Pt reports that she feels like she has improved even more since she last saw Dr. Allena Katz. Pt reports that her left leg is still stiff, in fact she still feels very stiff overall. Pt reports she is not able to squat down very low, still has to take the stairs one at a time, and just feels like here balance is "not there". Pt also has ongoing tingling in her fingers and toes as well as numbness in her lower back into her tailbone area. She is also unable to stand for a long period of time or her hips start to hurt BAD.  Prior to GBS patient enjoyed hiking, running, gardening, and has chickens. 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   Pt accompanied by: self  PERTINENT HISTORY:  PMH: diabetes mellitus, hypertension, hyperlipidemia  Per Dr. Allena Katz note 06/11/23: Guillain-Barre syndrome manifesting with progressive paresthesias, generalized weakness, and bulbar weakness (Jan 2024), supported by CSF findings (protein > 600) and MRI lumbar spine showing diffuse nerve root enhancement. She was treated with IVIG and made significant improvements. Exam shows mild weakness in the legs and distal  sensory loss. Reflexes are present at the knees and trace at the ankles (improved). NCS/EMG was reviewed and is normal indicating that she has a very good prognosis for recovery, but that it can take months for complete resolution of symptoms.  PAIN:  Are you having pain? No Nerve sensation in toes and fingers Numbness in low back/sacrum  PRECAUTIONS: Fall  RED FLAGS: None   WEIGHT BEARING RESTRICTIONS: No  FALLS: Has patient fallen in last 6 months? No  LIVING ENVIRONMENT: Lives with: lives with their family Lives in: House/apartment Stairs: Yes: Internal: 12 (to basement) steps; none and holds onto wall and External: one step, then mini step then big step steps; holds onto wall or door Has following equipment at home: Walker - 2 wheeled and shower chair  PLOF: Independent with gait and Independent with transfers  OCCUPATION: worked as a Designer, industrial/product prior to GBS  PATIENT GOALS: "be able to do stairs normally, get my balance, be able to squat or sit on the ground, go hiking, get back to my normal life"  OBJECTIVE:  Note: Objective measures were completed at Evaluation unless otherwise noted.  DIAGNOSTIC FINDINGS:  NCS/EMG of the left side 05/30/2023: This is a normal study of the left upper and lower extremities.  In particular, there is no evidence of a large fiber sensorimotor polyneuropathy or cervical/lumbosacral radiculopathy.   MRI lumbar spine 04/20/2023: 1. Diffuse thickening and enhancement of the nerve roots of the cauda equina. This is consistent with the clinical diagnosis of Guillain-Barre syndrome. The differential diagnosis includes an acute presentation of CIDP. 2. No significant disc protrusion or stenosis in the lumbar spine.   MRI cervical spine 04/15/2023: 1. Normal MRI appearance of the cervical spinal cord. 2. Mild noncompressive disc bulging at C2-3 through C6-7 without significant stenosis or overt neural impingement.   MRI brain wo contrast 04/15/2023: 1.  No acute intracranial abnormality. 2. Subcentimeter remote lacunar infarct at the posterior aspect of the right lentiform nucleus. 3. Otherwise normal brain MRI.  COGNITION: Overall cognitive status: Within functional limits for tasks assessed   SENSATION: Light touch WFL in BLE Has N/T in fingers and  toes, occasional sharp pinch in her feet  COORDINATION: Slightly slowed movements, L>R  EDEMA:  Around eyes and in fingers (PCP and neurologist aware)  POSTURE: rounded shoulders and forward head  LOWER EXTREMITY ROM:     Active  Right Eval Left Eval  Hip flexion    Hip extension    Hip abduction    Hip adduction    Hip internal rotation    Hip external rotation    Knee flexion    Knee extension    Ankle dorsiflexion    Ankle plantarflexion    Ankle inversion    Ankle eversion     (Blank rows = not tested)  LOWER EXTREMITY MMT:    MMT Right Eval Left Eval  Hip flexion 3+ 3  Hip extension    Hip abduction    Hip adduction    Hip internal rotation    Hip external rotation    Knee flexion 3 3  Knee extension 3 3  Ankle dorsiflexion 3 3  Ankle plantarflexion    Ankle inversion    Ankle eversion    (Blank rows = not tested)  BED MOBILITY:  Bed is very tall so hard to get into bed, easy to get out; has been sleeping on the floor  TRANSFERS: Assistive device utilized: None  Sit to stand: Modified independence Stand to sit: Modified independence Chair to chair: Modified independence Floor:  not assessed at eval, mod I per patient report but has to get into quadruped and push up from couch   STAIRS: Level of Assistance: Modified independence Stair Negotiation Technique: Step to Pattern with Bilateral Rails Number of Stairs: 4  Height of Stairs: 6"  Comments: step-to gait pattern  GAIT: Gait pattern:  decreased gait speed, decreased step length- Right, decreased step length- Left, decreased hip/knee flexion- Right, and decreased hip/knee flexion-  Left Distance walked: various clinic distances Assistive device utilized: None Level of assistance: Modified independence Comments: pt wearing slides - will wear tennis shoes to future sessions  FUNCTIONAL TESTS:    [facility] PT Assessment - 06/30/23 1416       Ambulation/Gait   Gait velocity 32.8 ft over 18.8 sec = 1.74 ft/sec      Standardized Balance Assessment   Standardized Balance Assessment Timed Up and Go Test;Five Times Sit to Stand    Five times sit to stand comments  29.8 sec   no UE     Timed Up and Go Test   TUG Normal TUG    Normal TUG (seconds) 14.75   no AD     Functional Gait  Assessment   Gait assessed  Yes    Gait Level Surface Walks 20 ft, slow speed, abnormal gait pattern, evidence for imbalance or deviates 10-15 in outside of the 12 in walkway width. Requires more than 7 sec to ambulate 20 ft.    Change in Gait Speed Makes only minor adjustments to walking speed, or accomplishes a change in speed with significant gait deviations, deviates 10-15 in outside the 12 in walkway width, or changes speed but loses balance but is able to recover and continue walking.    Gait with Horizontal Head Turns Performs head turns smoothly with slight change in gait velocity (eg, minor disruption to smooth gait path), deviates 6-10 in outside 12 in walkway width, or uses an assistive device.    Gait with Vertical Head Turns Performs head turns with no change in gait. Deviates no more than 6 in outside 12  in walkway width.    Gait and Pivot Turn Pivot turns safely in greater than 3 sec and stops with no loss of balance, or pivot turns safely within 3 sec and stops with mild imbalance, requires small steps to catch balance.    Step Over Obstacle Is able to step over one shoe box (4.5 in total height) but must slow down and adjust steps to clear box safely. May require verbal cueing.    Gait with Narrow Base of Support Ambulates 7-9 steps.    Gait with Eyes Closed Walks 20 ft, uses  assistive device, slower speed, mild gait deviations, deviates 6-10 in outside 12 in walkway width. Ambulates 20 ft in less than 9 sec but greater than 7 sec.    Ambulating Backwards Walks 20 ft, uses assistive device, slower speed, mild gait deviations, deviates 6-10 in outside 12 in walkway width.    Steps Two feet to a stair, must use rail.    Total Score 17    FGA comment: 17/30                                                                                                                                         TREATMENT: PT Evaluation   TherAct Education with patient about GBS and recovery process.   PATIENT EDUCATION: Education details: Eval findings, PT POC, results of OM and functional implications Person educated: Patient Education method: Explanation and Demonstration Education comprehension: verbalized understanding, returned demonstration, and needs further education  HOME EXERCISE PROGRAM: To be initiated  (HHPT gave her squats and seated strengthening exercises - heel/toe raises, LAQ, hip abd, etc.)  GOALS: Goals reviewed with patient? Yes  SHORT TERM GOALS=LONG TERM GOALS due to length of POC   LONG TERM GOALS: Target date: 07/30/2023  Pt will be independent with final HEP for improved strength, balance, transfers, endurance and gait. Baseline:  Goal status: INITIAL  2.  Pt will improve 5 x STS to less than or equal to 25 seconds to demonstrate improved functional strength and transfer efficiency.   Baseline: 29.81 sec no UE (4/7) Goal status: INITIAL  3.  Pt will improve gait velocity to at least 2.0 ft/sec for improved gait efficiency and performance at mod I level  Baseline: 1.74 ft/sec mod I (4/7) Goal status: INITIAL  4.  Pt will improve FGA to 22/30 for decreased fall risk  Baseline: 17/30 (4/7) Goal status: INITIAL  5.  to be assessed and LTG set Baseline:  Goal status: INITIAL    ASSESSMENT:  CLINICAL IMPRESSION: Patient is a  53 year old female referred to Neuro OPPT for GBS.   Pt's PMH is significant for: diabetes mellitus, hypertension, hyperlipidemia. The following deficits were present during the exam: decreased BLE strength, UE and LE sensory impairments, impaired endurance, and impaired balance. Based on her 5xSTS score of 29.81 sec, gait speed of 1.74 ft/sec,  FGA score of 17/30, and LE strength and sensory impairments, pt is an increased risk for falls. Pt would benefit from skilled PT to address these impairments and functional limitations to maximize functional mobility independence.   OBJECTIVE IMPAIRMENTS: Abnormal gait, cardiopulmonary status limiting activity, decreased activity tolerance, decreased balance, decreased coordination, decreased endurance, decreased knowledge of condition, decreased mobility, difficulty walking, decreased strength, impaired perceived functional ability, impaired sensation, and pain.   ACTIVITY LIMITATIONS: carrying, lifting, bending, sitting, squatting, stairs, transfers, and bed mobility  PARTICIPATION LIMITATIONS: meal prep, cleaning, laundry, occupation, and yard work  PERSONAL FACTORS: 1-2 comorbidities:    diabetes mellitus, hypertension, hyperlipidemia are also affecting patient's functional outcome.   REHAB POTENTIAL: Excellent  CLINICAL DECISION MAKING: Stable/uncomplicated  EVALUATION COMPLEXITY: Low  PLAN:  PT FREQUENCY: 2x/week  PT DURATION: 4 weeks  PLANNED INTERVENTIONS: 97164- PT Re-evaluation, 97110-Therapeutic exercises, 97530- Therapeutic activity, [id number]- Neuromuscular re-education, 97535- Self Care, 47829- Manual therapy, [phone]- Gait training, [phone]- Orthotic Fit/training, [phone]- Aquatic Therapy, [phone]- Electrical stimulation (manual), Patient/Family education, Balance training, Stair training, Taping, Dry Needling, Joint mobilization, DME instructions, Cryotherapy, and Moist heat  PLAN FOR NEXT SESSION: further assess hip weakness? and write LTG,  functional strengthening for BLE (sit to stands, step taps or step ups, lateral stepping, monster walks), confirm no pacemaker then could trial TENS for N/T in feet, work towards return to hiking and gardening, endurance   Peter Congo, PT Peter Congo, PT, DPT, CSRS  06/30/2023, 2:36 PM

## 2023-07-07 ENCOUNTER — Ambulatory Visit

## 2023-07-07 DIAGNOSIS — M6281 Muscle weakness (generalized): Secondary | ICD-10-CM

## 2023-07-07 DIAGNOSIS — R278 Other lack of coordination: Secondary | ICD-10-CM

## 2023-07-07 DIAGNOSIS — R2689 Other abnormalities of gait and mobility: Secondary | ICD-10-CM

## 2023-07-07 DIAGNOSIS — R2681 Unsteadiness on feet: Secondary | ICD-10-CM

## 2023-07-07 NOTE — Therapy (Unsigned)
 OUTPATIENT PHYSICAL THERAPY NEURO TREATMENT   Patient Name: Becky Shaw MRN: [id number] DOB:Jan 29, 1971, 53 y.o., female Today's Date: 07/07/2023   PCP: Eustaquio Boyden, MD REFERRING PROVIDER: Glendale Chard, DO  END OF SESSION:  PT End of Session - 07/07/23 1527     Visit Number 2    Number of Visits 9    Date for PT Re-Evaluation 08/11/23    Authorization Type [facility]    PT Start Time 1527    PT Stop Time 1611    PT Time Calculation (min) 44 min    Activity Tolerance Patient tolerated treatment well    Behavior During Therapy [facility] for tasks assessed/performed             Past Medical History:  Diagnosis Date   ALLERGIC RHINITIS 05/17/2007   GANGLION CYST, WRIST, RIGHT 05/31/2008   Hyperlipidemia    OVERACTIVE BLADDER 05/15/2007   Prediabetes    Past Surgical History:  Procedure Laterality Date   CHOLECYSTECTOMY     Patient Active Problem List   Diagnosis Date Noted   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   Neuropathy 04/19/2023   GBS (Guillain Barre syndrome) (HCC) 04/19/2023   Thyroid nodule 04/17/2023   Postmenopausal 04/17/2023   Back pain 04/17/2023   History of stroke in adulthood 04/16/2023   Disturbance of skin sensation 04/16/2023   Elevated blood protein 04/16/2023   Paresthesias 04/14/2023   Travel advice encounter 09/16/2022   Menopausal state 09/16/2022   Borderline hypothyroidism 03/18/2022   Family history of stroke (cerebrovascular) 03/08/2022   Type 2 diabetes mellitus without complication, without long-term current use of insulin (HCC) 12/07/2021   Dermatitis of both ear canals 08/29/2021   Hyperlipidemia associated with type 2 diabetes mellitus (HCC) 06/26/2020   Hives 10/18/2015   Essential hypertension 03/22/2015   Aphthous ulcer of mouth 12/16/2011   Health maintenance examination 12/07/2010   Allergic rhinitis 05/17/2007    ONSET DATE: 06/23/2023 (referral date, symptom onset 04/11/2023)  REFERRING DIAG: G61.0  (ICD-10-CM) - GBS (Guillain Barre syndrome) (HCC) R26.81 (ICD-10-CM) - Unsteady gait M79.2 (ICD-10-CM) - Neuropathic pain  THERAPY DIAG:  Muscle weakness (generalized)  Other abnormalities of gait and mobility  Unsteadiness on feet  Other lack of coordination  Rationale for Evaluation and Treatment: Rehabilitation  SUBJECTIVE:                                                                                                                                                                                             SUBJECTIVE STATEMENT: "[name]"  Patient reports doing fair. Does have increased L knee pain with activity and increased tingling primarily in  L shin. Denies falls.    Pt accompanied by: self  PERTINENT HISTORY:  PMH: diabetes mellitus, hypertension, hyperlipidemia  Per Dr. Allena Katz note 06/11/23: Guillain-Barre syndrome manifesting with progressive paresthesias, generalized weakness, and bulbar weakness (Jan 2024), supported by CSF findings (protein > 600) and MRI lumbar spine showing diffuse nerve root enhancement. She was treated with IVIG and made significant improvements. Exam shows mild weakness in the legs and distal sensory loss. Reflexes are present at the knees and trace at the ankles (improved). NCS/EMG was reviewed and is normal indicating that she has a very good prognosis for recovery, but that it can take months for complete resolution of symptoms.  PAIN:  Are you having pain? Yes: NPRS scale: 3/10 Nerve sensation in toes and fingers Numbness in low back/sacrum  PRECAUTIONS: Fall  PATIENT GOALS: "be able to do stairs normally, get my balance, be able to squat or sit on the ground, go hiking, get back to my normal life"  OBJECTIVE:  Note: Objective measures were completed at Evaluation unless otherwise noted.  DIAGNOSTIC FINDINGS:  NCS/EMG of the left side 05/30/2023: This is a normal study of the left upper and lower extremities.  In particular, there is no  evidence of a large fiber sensorimotor polyneuropathy or cervical/lumbosacral radiculopathy.   MRI lumbar spine 04/20/2023: 1. Diffuse thickening and enhancement of the nerve roots of the cauda equina. This is consistent with the clinical diagnosis of Guillain-Barre syndrome. The differential diagnosis includes an acute presentation of CIDP. 2. No significant disc protrusion or stenosis in the lumbar spine.   MRI cervical spine 04/15/2023: 1. Normal MRI appearance of the cervical spinal cord. 2. Mild noncompressive disc bulging at C2-3 through C6-7 without significant stenosis or overt neural impingement.   MRI brain wo contrast 04/15/2023: 1. No acute intracranial abnormality. 2. Subcentimeter remote lacunar infarct at the posterior aspect of the right lentiform nucleus. 3. Otherwise normal brain MRI.                                                                                                                              TREATMENT:  TENS HEP Ed on nerve recovery***   PATIENT EDUCATION: Education details: *** Person educated: Patient Education method: Medical illustrator Education comprehension: verbalized understanding, returned demonstration, and needs further education  HOME EXERCISE PROGRAM: Access Code: [email or web address] URL: [email or web address] Date: 07/07/2023 Prepared by: Merry Lofty  Exercises - Standing Hip Abduction with Counter Support  - 1 x daily - 7 x weekly - 3 sets - 10 reps - Standing Hip Extension with Counter Support  - 1 x daily - 7 x weekly - 3 sets - 10 reps - Standing March with Counter Support  - 1 x daily - 7 x weekly - 3 sets - 10 reps - Mini Squat with Counter Support  - 1 x daily - 7 x weekly - 3 sets - 10 reps - Heel Raises with Counter Support  - 1  x daily - 7 x weekly - 3 sets - 10 reps - Sit to Stand  - 1 x daily - 7 x weekly - 3 sets - 5 reps - Step Up  - 1 x daily - 7 x weekly - 3 sets - 10 reps - Towel Scrunches  - 1 x  daily - 7 x weekly - 3 sets - 10 reps  GOALS: Goals reviewed with patient? Yes  SHORT TERM GOALS=LONG TERM GOALS due to length of POC   LONG TERM GOALS: Target date: 07/30/2023  Pt will be independent with final HEP for improved strength, balance, transfers, endurance and gait. Baseline:  Goal status: INITIAL  2.  Pt will improve 5 x STS to less than or equal to 25 seconds to demonstrate improved functional strength and transfer efficiency.   Baseline: 29.81 sec no UE (4/7) Goal status: INITIAL  3.  Pt will improve gait velocity to at least 2.0 ft/sec for improved gait efficiency and performance at mod I level  Baseline: 1.74 ft/sec mod I (4/7) Goal status: INITIAL  4.  Pt will improve FGA to 22/30 for decreased fall risk  Baseline: 17/30 (4/7) Goal status: INITIAL  5.  to be assessed and LTG set Baseline:  Goal status: INITIAL    ASSESSMENT:  CLINICAL IMPRESSION: ***   OBJECTIVE IMPAIRMENTS: Abnormal gait, cardiopulmonary status limiting activity, decreased activity tolerance, decreased balance, decreased coordination, decreased endurance, decreased knowledge of condition, decreased mobility, difficulty walking, decreased strength, impaired perceived functional ability, impaired sensation, and pain.   ACTIVITY LIMITATIONS: carrying, lifting, bending, sitting, squatting, stairs, transfers, and bed mobility  PARTICIPATION LIMITATIONS: meal prep, cleaning, laundry, occupation, and yard work  PERSONAL FACTORS: 1-2 comorbidities:    diabetes mellitus, hypertension, hyperlipidemia are also affecting patient's functional outcome.   REHAB POTENTIAL: Excellent  CLINICAL DECISION MAKING: Stable/uncomplicated  EVALUATION COMPLEXITY: Low  PLAN:  PT FREQUENCY: 2x/week  PT DURATION: 4 weeks  PLANNED INTERVENTIONS: 97164- PT Re-evaluation, 97110-Therapeutic exercises, 97530- Therapeutic activity, [id number]- Neuromuscular re-education, 97535- Self Care, 16109- Manual  therapy, [phone]- Gait training, [phone]- Orthotic Fit/training, [phone]- Aquatic Therapy, [phone]- Electrical stimulation (manual), Patient/Family education, Balance training, Stair training, Taping, Dry Needling, Joint mobilization, DME instructions, Cryotherapy, and Moist heat  PLAN FOR NEXT SESSION: further assess hip weakness? and write LTG, functional strengthening for BLE (sit to stands, step taps or step ups, lateral stepping, monster walks), confirm no pacemaker then could trial TENS for N/T in feet, work towards return to hiking and gardening, endurance   Rebecca Campus, PT Rebecca Campus, PT, DPT, CBIS  07/07/2023, 4:20 PM

## 2023-07-09 ENCOUNTER — Ambulatory Visit

## 2023-07-09 DIAGNOSIS — R2689 Other abnormalities of gait and mobility: Secondary | ICD-10-CM

## 2023-07-09 DIAGNOSIS — M6281 Muscle weakness (generalized): Secondary | ICD-10-CM | POA: Diagnosis not present

## 2023-07-09 DIAGNOSIS — R278 Other lack of coordination: Secondary | ICD-10-CM

## 2023-07-09 DIAGNOSIS — R2681 Unsteadiness on feet: Secondary | ICD-10-CM

## 2023-07-09 NOTE — Therapy (Signed)
 OUTPATIENT PHYSICAL THERAPY NEURO TREATMENT   Patient Name: Becky Shaw MRN: [id number] DOB:11-08-1970, 53 y.o., female Today's Date: 07/09/2023   PCP: Claire Crick, MD REFERRING PROVIDER: Daryel Ensign, DO  END OF SESSION:  PT End of Session - 07/09/23 1401     Visit Number 3    Number of Visits 9    Date for PT Re-Evaluation 08/11/23    Authorization Type [facility]    PT Start Time 1400    PT Stop Time 1439    PT Time Calculation (min) 39 min    Equipment Utilized During Treatment Gait belt    Activity Tolerance Patient tolerated treatment well    Behavior During Therapy [facility] for tasks assessed/performed             Past Medical History:  Diagnosis Date   ALLERGIC RHINITIS 05/17/2007   GANGLION CYST, WRIST, RIGHT 05/31/2008   Hyperlipidemia    OVERACTIVE BLADDER 05/15/2007   Prediabetes    Past Surgical History:  Procedure Laterality Date   CHOLECYSTECTOMY     Patient Active Problem List   Diagnosis Date Noted   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   Neuropathy 04/19/2023   GBS (Guillain Barre syndrome) (HCC) 04/19/2023   Thyroid nodule 04/17/2023   Postmenopausal 04/17/2023   Back pain 04/17/2023   History of stroke in adulthood 04/16/2023   Disturbance of skin sensation 04/16/2023   Elevated blood protein 04/16/2023   Paresthesias 04/14/2023   Travel advice encounter 09/16/2022   Menopausal state 09/16/2022   Borderline hypothyroidism 03/18/2022   Family history of stroke (cerebrovascular) 03/08/2022   Type 2 diabetes mellitus without complication, without long-term current use of insulin (HCC) 12/07/2021   Dermatitis of both ear canals 08/29/2021   Hyperlipidemia associated with type 2 diabetes mellitus (HCC) 06/26/2020   Hives 10/18/2015   Essential hypertension 03/22/2015   Aphthous ulcer of mouth 12/16/2011   Health maintenance examination 12/07/2010   Allergic rhinitis 05/17/2007    ONSET DATE: 06/23/2023 (referral date, symptom  onset 04/11/2023)  REFERRING DIAG: G61.0 (ICD-10-CM) - GBS (Guillain Barre syndrome) (HCC) R26.81 (ICD-10-CM) - Unsteady gait M79.2 (ICD-10-CM) - Neuropathic pain  THERAPY DIAG:  Muscle weakness (generalized)  Other abnormalities of gait and mobility  Unsteadiness on feet  Other lack of coordination  Rationale for Evaluation and Treatment: Rehabilitation  SUBJECTIVE:                                                                                                                                                                                             SUBJECTIVE STATEMENT: "[name]"  Patient reports doing fair. Reports pain and N/T  is all the same. Didn't feel as though TENS had any impact on her nerve s/s. Denies falls.    Pt accompanied by: self  PERTINENT HISTORY:  PMH: diabetes mellitus, hypertension, hyperlipidemia  Per Dr. Allena Katz note 06/11/23: Guillain-Barre syndrome manifesting with progressive paresthesias, generalized weakness, and bulbar weakness (Jan 2024), supported by CSF findings (protein > 600) and MRI lumbar spine showing diffuse nerve root enhancement. She was treated with IVIG and made significant improvements. Exam shows mild weakness in the legs and distal sensory loss. Reflexes are present at the knees and trace at the ankles (improved). NCS/EMG was reviewed and is normal indicating that she has a very good prognosis for recovery, but that it can take months for complete resolution of symptoms.  PAIN:  Are you having pain? Yes: NPRS scale: 3/10 Nerve sensation in toes and fingers Numbness in low back/sacrum  PRECAUTIONS: Fall  PATIENT GOALS: "be able to do stairs normally, get my balance, be able to squat or sit on the ground, go hiking, get back to my normal life"  OBJECTIVE:  Note: Objective measures were completed at Evaluation unless otherwise noted.  DIAGNOSTIC FINDINGS:  NCS/EMG of the left side 05/30/2023: This is a normal study of the left upper and  lower extremities.  In particular, there is no evidence of a large fiber sensorimotor polyneuropathy or cervical/lumbosacral radiculopathy.   MRI lumbar spine 04/20/2023: 1. Diffuse thickening and enhancement of the nerve roots of the cauda equina. This is consistent with the clinical diagnosis of Guillain-Barre syndrome. The differential diagnosis includes an acute presentation of CIDP. 2. No significant disc protrusion or stenosis in the lumbar spine.   MRI cervical spine 04/15/2023: 1. Normal MRI appearance of the cervical spinal cord. 2. Mild noncompressive disc bulging at C2-3 through C6-7 without significant stenosis or overt neural impingement.   MRI brain wo contrast 04/15/2023: 1. No acute intracranial abnormality. 2. Subcentimeter remote lacunar infarct at the posterior aspect of the right lentiform nucleus. 3. Otherwise normal brain MRI.                                                                                                                              TREATMENT:  Theract: : 758ft no AD, 0/10 RPE  NMR: -bird dog quadruped -resisted sitting on heels -> tall kneeling  -seated kettlebell march over -scifit hills level 2 x 10 mins B UE/LE for large amplitude reciprocal coordination   PATIENT EDUCATION: Education details: continue HEP Person educated: Patient Education method: Medical illustrator Education comprehension: verbalized understanding, returned demonstration, and needs further education  HOME EXERCISE PROGRAM: Access Code: [email or web address] URL: [email or web address] Date: 07/07/2023 Prepared by: Merry Lofty  Exercises - Standing Hip Abduction with Counter Support  - 1 x daily - 7 x weekly - 3 sets - 10 reps - Standing Hip Extension with Counter Support  - 1 x daily - 7 x weekly - 3 sets - 10 reps - Standing March with  Counter Support  - 1 x daily - 7 x weekly - 3 sets - 10 reps - Mini Squat with Counter Support  - 1 x daily - 7  x weekly - 3 sets - 10 reps - Heel Raises with Counter Support  - 1 x daily - 7 x weekly - 3 sets - 10 reps - Sit to Stand  - 1 x daily - 7 x weekly - 3 sets - 5 reps - Step Up  - 1 x daily - 7 x weekly - 3 sets - 10 reps - Towel Scrunches  - 1 x daily - 7 x weekly - 3 sets - 10 reps  GOALS: Goals reviewed with patient? Yes  SHORT TERM GOALS=LONG TERM GOALS due to length of POC   LONG TERM GOALS: Target date: 07/30/2023  Pt will be independent with final HEP for improved strength, balance, transfers, endurance and gait. Baseline:  Goal status: INITIAL  2.  Pt will improve 5 x STS to less than or equal to 25 seconds to demonstrate improved functional strength and transfer efficiency.   Baseline: 29.81 sec no UE (4/7) Goal status: INITIAL  3.  Pt will improve gait velocity to at least 2.0 ft/sec for improved gait efficiency and performance at mod I level  Baseline: 1.74 ft/sec mod I (4/7) Goal status: INITIAL  4.  Pt will improve FGA to 22/30 for decreased fall risk  Baseline: 17/30 (4/7) Goal status: INITIAL  5.  Patient will ambulate >/=1055ft during to demonstrate improved endurance.  Baseline: 777ft Goal status: REVISED    ASSESSMENT:  CLINICAL IMPRESSION: Patient seen for skilled PT session with emphasis on completing outcome measures and continuing functional strengthening. 6 Min Walk Test:  Instructed patient to ambulate as quickly and as safely as possible for 6 minutes using LRAD. Patient was allowed to take standing rest breaks without stopping the test, but if the patient required a sitting rest break the clock would be stopped and the test would be over.  Results: 766 feet. Results indicate that the patient has reduced endurance with ambulation compared to age matched norms.  Age Matched Norms: 72-69 yo M: 70 F: 72, 29-79 yo M: 66 F: 471, 86-89 yo M: 417 F: 392 MDC: 58.21 meters (190.98 feet) or 50 meters (ANPTA Core Set of Outcome Measures for Adults with  Neurologic Conditions, 2018). Patient improving well with proximal strengthening. Continue POC.    OBJECTIVE IMPAIRMENTS: Abnormal gait, cardiopulmonary status limiting activity, decreased activity tolerance, decreased balance, decreased coordination, decreased endurance, decreased knowledge of condition, decreased mobility, difficulty walking, decreased strength, impaired perceived functional ability, impaired sensation, and pain.   ACTIVITY LIMITATIONS: carrying, lifting, bending, sitting, squatting, stairs, transfers, and bed mobility  PARTICIPATION LIMITATIONS: meal prep, cleaning, laundry, occupation, and yard work  PERSONAL FACTORS: 1-2 comorbidities:    diabetes mellitus, hypertension, hyperlipidemia are also affecting patient's functional outcome.   REHAB POTENTIAL: Excellent  CLINICAL DECISION MAKING: Stable/uncomplicated  EVALUATION COMPLEXITY: Low  PLAN:  PT FREQUENCY: 2x/week  PT DURATION: 4 weeks  PLANNED INTERVENTIONS: 97164- PT Re-evaluation, 97110-Therapeutic exercises, 97530- Therapeutic activity, 97112- Neuromuscular re-education, 97535- Self Care, 21308- Manual therapy, [phone]- Gait training, [phone]- Orthotic Fit/training, [phone]- Aquatic Therapy, [phone]- Electrical stimulation (manual), Patient/Family education, Balance training, Stair training, Taping, Dry Needling, Joint mobilization, DME instructions, Cryotherapy, and Moist heat  PLAN FOR NEXT SESSION: functional strengthening for BLE (sit to stands, step taps or step ups, lateral stepping, monster walks), confirm no pacemaker then could trial TENS  for N/T in feet, work towards return to hiking and gardening, endurance   Rebecca Campus, PT Rebecca Campus, PT, DPT, CBIS  07/09/2023, 3:02 PM

## 2023-07-14 ENCOUNTER — Ambulatory Visit

## 2023-07-14 DIAGNOSIS — M6281 Muscle weakness (generalized): Secondary | ICD-10-CM | POA: Diagnosis not present

## 2023-07-14 DIAGNOSIS — R2689 Other abnormalities of gait and mobility: Secondary | ICD-10-CM

## 2023-07-14 DIAGNOSIS — R278 Other lack of coordination: Secondary | ICD-10-CM

## 2023-07-14 DIAGNOSIS — R2681 Unsteadiness on feet: Secondary | ICD-10-CM

## 2023-07-14 NOTE — Therapy (Signed)
 OUTPATIENT PHYSICAL THERAPY NEURO TREATMENT   Patient Name: Sennie Borden MRN: [id number] DOB:1970/10/29, 53 y.o., female Today's Date: 07/14/2023   PCP: Claire Crick, MD REFERRING PROVIDER: Daryel Ensign, DO  END OF SESSION:  PT End of Session - 07/14/23 1101     Visit Number 4    Number of Visits 9    Date for PT Re-Evaluation 08/11/23    Authorization Type [facility]    PT Start Time 1100    PT Stop Time 1143    PT Time Calculation (min) 43 min    Activity Tolerance Patient tolerated treatment well    Behavior During Therapy 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   Neuropathy 04/19/2023   GBS (Guillain Barre syndrome) (HCC) 04/19/2023   Thyroid  nodule 04/17/2023   Postmenopausal 04/17/2023   Back pain 04/17/2023   History of stroke in adulthood 04/16/2023   Disturbance of skin sensation 04/16/2023   Elevated blood protein 04/16/2023   Paresthesias 04/14/2023   Travel advice encounter 09/16/2022   Menopausal state 09/16/2022   Borderline hypothyroidism 03/18/2022   Family history of stroke (cerebrovascular) 03/08/2022   Type 2 diabetes mellitus without complication, without long-term current use of insulin  (HCC) 12/07/2021   Dermatitis of both ear canals 08/29/2021   Hyperlipidemia associated with type 2 diabetes mellitus (HCC) 06/26/2020   Hives 10/18/2015   Essential hypertension 03/22/2015   Aphthous ulcer of mouth 12/16/2011   Health maintenance examination 12/07/2010   Allergic rhinitis 05/17/2007    ONSET DATE: 06/23/2023 (referral date, symptom onset 04/11/2023)  REFERRING DIAG: G61.0  (ICD-10-CM) - GBS (Guillain Barre syndrome) (HCC) R26.81 (ICD-10-CM) - Unsteady gait M79.2 (ICD-10-CM) - Neuropathic pain  THERAPY DIAG:  Muscle weakness (generalized)  Other abnormalities of gait and mobility  Unsteadiness on feet  Other lack of coordination  Rationale for Evaluation and Treatment: Rehabilitation  SUBJECTIVE:                                                                                                                                                                                             SUBJECTIVE STATEMENT: "[name]"  Patient reports doing well. Is having an allergy flare. Reports feeling ~70% recovered. Is having some "electric  shocks" in her B hands. Would like to be able to get on/off the floor without assist. Denies falls.    Pt accompanied by: self  PERTINENT HISTORY:  PMH: diabetes mellitus, hypertension, hyperlipidemia  Per Dr. Lydia Sams note 06/11/23: Guillain-Barre syndrome manifesting with progressive paresthesias, generalized weakness, and bulbar weakness (Jan 2024), supported by CSF findings (protein > 600) and MRI lumbar spine showing diffuse nerve root enhancement. She was treated with IVIG and made significant improvements. Exam shows mild weakness in the legs and distal sensory loss. Reflexes are present at the knees and trace at the ankles (improved). NCS/EMG was reviewed and is normal indicating that she has a very good prognosis for recovery, but that it can take months for complete resolution of symptoms.  PAIN:  Are you having pain? Yes: NPRS scale: 3/10 Nerve sensation in toes and fingers Numbness in low back/sacrum  PRECAUTIONS: Fall  PATIENT GOALS: "be able to do stairs normally, get my balance, be able to squat or sit on the ground, go hiking, get back to my normal life"  OBJECTIVE:  Note: Objective measures were completed at Evaluation unless otherwise noted.  DIAGNOSTIC FINDINGS:  NCS/EMG of the left side 05/30/2023: This is a  normal study of the left upper and lower extremities.  In particular, there is no evidence of a large fiber sensorimotor polyneuropathy or cervical/lumbosacral radiculopathy.   MRI lumbar spine 04/20/2023: 1. Diffuse thickening and enhancement of the nerve roots of the cauda equina. This is consistent with the clinical diagnosis of Guillain-Barre syndrome. The differential diagnosis includes an acute presentation of CIDP. 2. No significant disc protrusion or stenosis in the lumbar spine.   MRI cervical spine 04/15/2023: 1. Normal MRI appearance of the cervical spinal cord. 2. Mild noncompressive disc bulging at C2-3 through C6-7 without significant stenosis or overt neural impingement.   MRI brain wo contrast 04/15/2023: 1. No acute intracranial abnormality. 2. Subcentimeter remote lacunar infarct at the posterior aspect of the right lentiform nucleus. 3. Otherwise normal brain MRI.                                                                                                                              TREATMENT:  Theract: -applied K-tape to L knee for patellar tracking   -discussed to remove after 3-5 days or in instances of discomfort   NMR: -tall kneel -> half kneel with bench support -sustained half kneel with 3# DB shoulder press in contralateral arm  -increased general instability with L LE as base  -scifit hills level level 3 x10 mins B UE/LE for improved reciprocal coordination   PATIENT EDUCATION: Education details: continue HEP, remove K-tape after 3-5 days or with discomfort  Person educated: Patient Education method: Explanation and Demonstration Education comprehension: verbalized understanding, returned demonstration, and needs further education  HOME EXERCISE PROGRAM: Access Code: [email or web address] URL: [email or web address] Date: 07/07/2023 Prepared by: Nickola Baron  Exercises - Standing Hip Abduction with Counter Support  -  1 x daily - 7 x weekly - 3  sets - 10 reps - Standing Hip Extension with Counter Support  - 1 x daily - 7 x weekly - 3 sets - 10 reps - Standing March with Counter Support  - 1 x daily - 7 x weekly - 3 sets - 10 reps - Mini Squat with Counter Support  - 1 x daily - 7 x weekly - 3 sets - 10 reps - Heel Raises with Counter Support  - 1 x daily - 7 x weekly - 3 sets - 10 reps - Sit to Stand  - 1 x daily - 7 x weekly - 3 sets - 5 reps - Step Up  - 1 x daily - 7 x weekly - 3 sets - 10 reps - Towel Scrunches  - 1 x daily - 7 x weekly - 3 sets - 10 reps  GOALS: Goals reviewed with patient? Yes  SHORT TERM GOALS=LONG TERM GOALS due to length of POC   LONG TERM GOALS: Target date: 07/30/2023  Pt will be independent with final HEP for improved strength, balance, transfers, endurance and gait. Baseline:  Goal status: INITIAL  2.  Pt will improve 5 x STS to less than or equal to 25 seconds to demonstrate improved functional strength and transfer efficiency.   Baseline: 29.81 sec no UE (4/7) Goal status: INITIAL  3.  Pt will improve gait velocity to at least 2.0 ft/sec for improved gait efficiency and performance at mod I level  Baseline: 1.74 ft/sec mod I (4/7) Goal status: INITIAL  4.  Pt will improve FGA to 22/30 for decreased fall risk  Baseline: 17/30 (4/7) Goal status: INITIAL  5.  Patient will ambulate >/=1069ft during to demonstrate improved endurance.  Baseline: 731ft Goal status: REVISED    ASSESSMENT:  CLINICAL IMPRESSION: Patient seen for skilled PT session with emphasis on functional strengthening. Progressing well with proximal L LE strength, but remains less than R side resulting in general instability, especially with NBOS and dynamic tasks. Continue POC.    OBJECTIVE IMPAIRMENTS: Abnormal gait, cardiopulmonary status limiting activity, decreased activity tolerance, decreased balance, decreased coordination, decreased endurance, decreased knowledge of condition, decreased mobility, difficulty  walking, decreased strength, impaired perceived functional ability, impaired sensation, and pain.   ACTIVITY LIMITATIONS: carrying, lifting, bending, sitting, squatting, stairs, transfers, and bed mobility  PARTICIPATION LIMITATIONS: meal prep, cleaning, laundry, occupation, and yard work  PERSONAL FACTORS: 1-2 comorbidities:    diabetes mellitus, hypertension, hyperlipidemia are also affecting patient's functional outcome.   REHAB POTENTIAL: Excellent  CLINICAL DECISION MAKING: Stable/uncomplicated  EVALUATION COMPLEXITY: Low  PLAN:  PT FREQUENCY: 2x/week  PT DURATION: 4 weeks  PLANNED INTERVENTIONS: 97164- PT Re-evaluation, 97110-Therapeutic exercises, 97530- Therapeutic activity, 97112- Neuromuscular re-education, 97535- Self Care, 40981- Manual therapy, [phone]- Gait training, [phone]- Orthotic Fit/training, [phone]- Aquatic Therapy, [phone]- Electrical stimulation (manual), Patient/Family education, Balance training, Stair training, Taping, Dry Needling, Joint mobilization, DME instructions, Cryotherapy, and Moist heat  PLAN FOR NEXT SESSION: functional strengthening for BLE (sit to stands, step taps or step ups, lateral stepping, monster walks), confirm no pacemaker then could trial TENS for N/T in feet, work towards return to hiking and gardening, endurance   Rebecca Campus, PT Rebecca Campus, PT, DPT, CBIS  07/14/2023, 12:39 PM

## 2023-07-15 ENCOUNTER — Ambulatory Visit: Payer: Self-pay

## 2023-07-15 NOTE — Telephone Encounter (Signed)
Noted- I will see her then 

## 2023-07-15 NOTE — Telephone Encounter (Signed)
 Copied from CRM [id number]. Topic: Clinical - Red Word Triage >> Jul 15, 2023  9:16 AM Crispin Dolphin wrote: Red Word that prompted transfer to Nurse Triage: blood in urine and burning   Chief Complaint: Blood in Urine Symptoms: Dysuria, Frequency Frequency: Last night  Pertinent Negatives: Patient denies fever, back-flank pain  Disposition: [] ED /[] Urgent Care (no appt availability in office) / [x] Appointment(In office/virtual)/ []  [facility]/ [] Home Care/ [] Refused Recommended Disposition /[] [facility]/ []  Follow-up with PCP Additional Notes: MY is being triaged for blood in her urine since yesterday. The patient also complains of pain while urinating and frequency. Recommended the patient be seen by a provider in office in 24 hours. The patient scheduled for Thursday per her schedule.   Reason for Disposition  Pain or burning with passing urine  Answer Assessment - Initial Assessment Questions 1. COLOR of URINE: "Describe the color of the urine."  (e.g., tea-colored, pink, red, bloody) "Do you have blood clots in your urine?" (e.g., none, pea, grape, small coin)     Red   2. ONSET: "When did the bleeding start?"      Yesterday  3. EPISODES: "How many times has there been blood in the urine?" or "How many times today?"     Once this morning  4. PAIN with URINATION: "Is there any pain with passing your urine?" If Yes, ask: "How bad is the pain?"  (Scale 1-10; or mild, moderate, severe)    - MILD: Complains slightly about urination hurting.    - MODERATE: Interferes with normal activities.      - SEVERE: Excruciating, unwilling or unable to urinate because of the pain.      Moderate  5. FEVER: "Do you have a fever?" If Yes, ask: "What is your temperature, how was it measured, and when did it start?"     No  6. ASSOCIATED SYMPTOMS: "Are you passing urine more frequently than usual?"     Yes  7. OTHER SYMPTOMS: "Do you have any other symptoms?" (e.g.,  back/flank  pain, abdomen pain, vomiting)     No  8. PREGNANCY: "Is there any chance you are pregnant?" "When was your last menstrual period?"     NO and NO  Protocols used: Urine - Blood In-A-AH

## 2023-07-16 ENCOUNTER — Ambulatory Visit: Admitting: Physical Therapy

## 2023-07-17 ENCOUNTER — Ambulatory Visit: Admitting: Internal Medicine

## 2023-07-18 ENCOUNTER — Ambulatory Visit

## 2023-07-18 DIAGNOSIS — R2689 Other abnormalities of gait and mobility: Secondary | ICD-10-CM

## 2023-07-18 DIAGNOSIS — R2681 Unsteadiness on feet: Secondary | ICD-10-CM

## 2023-07-18 DIAGNOSIS — R278 Other lack of coordination: Secondary | ICD-10-CM

## 2023-07-18 DIAGNOSIS — M6281 Muscle weakness (generalized): Secondary | ICD-10-CM

## 2023-07-18 NOTE — Therapy (Signed)
 OUTPATIENT PHYSICAL THERAPY NEURO TREATMENT   Patient Name: Becky Shaw MRN: [id number] DOB:1971-03-19, 53 y.o., female Today's Date: 07/18/2023   PCP: Claire Crick, MD REFERRING PROVIDER: Daryel Ensign, DO  END OF SESSION:  PT End of Session - 07/18/23 1104     Visit Number 5    Number of Visits 9    Date for PT Re-Evaluation 08/11/23    Authorization Type [facility]    PT Start Time 1102    PT Stop Time 1142    PT Time Calculation (min) 40 min    Equipment Utilized During Treatment Gait belt    Activity Tolerance Patient tolerated treatment well    Behavior During Therapy [facility] for tasks assessed/performed             Past Medical History:  Diagnosis Date   ALLERGIC RHINITIS 05/17/2007   GANGLION CYST, WRIST, RIGHT 05/31/2008   Hyperlipidemia    OVERACTIVE BLADDER 05/15/2007   Prediabetes    Past Surgical History:  Procedure Laterality Date   CHOLECYSTECTOMY     Patient Active Problem List   Diagnosis Date Noted   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   Neuropathy 04/19/2023   GBS (Guillain Barre syndrome) (HCC) 04/19/2023   Thyroid  nodule 04/17/2023   Postmenopausal 04/17/2023   Back pain 04/17/2023   History of stroke in adulthood 04/16/2023   Disturbance of skin sensation 04/16/2023   Elevated blood protein 04/16/2023   Paresthesias 04/14/2023   Travel advice encounter 09/16/2022   Menopausal state 09/16/2022   Borderline hypothyroidism 03/18/2022   Family history of stroke (cerebrovascular) 03/08/2022   Type 2 diabetes mellitus without complication, without long-term current use of insulin  (HCC) 12/07/2021   Dermatitis of both ear canals 08/29/2021   Hyperlipidemia associated with type 2 diabetes mellitus (HCC) 06/26/2020   Hives 10/18/2015   Essential hypertension 03/22/2015   Aphthous ulcer of mouth 12/16/2011   Health maintenance examination 12/07/2010   Allergic rhinitis 05/17/2007    ONSET DATE: 06/23/2023 (referral date, symptom  onset 04/11/2023)  REFERRING DIAG: G61.0 (ICD-10-CM) - GBS (Guillain Barre syndrome) (HCC) R26.81 (ICD-10-CM) - Unsteady gait M79.2 (ICD-10-CM) - Neuropathic pain  THERAPY DIAG:  Muscle weakness (generalized)  Other abnormalities of gait and mobility  Unsteadiness on feet  Other lack of coordination  Rationale for Evaluation and Treatment: Rehabilitation  SUBJECTIVE:                                                                                                                                                                                             SUBJECTIVE STATEMENT: "Becky Shaw"  Patient reports doing fair. Went to a peripheral  neuropathy clinic and felt as though she can still "check all the boxes" and feels as though she's more at 50% recovered than previously reported 70%. She reports that her husband had her do typical tasks at home and it was quite a challenge (more tasks in the garden). Denies falls. Is now experiencing vs noticing burning in her feet and hypersensitivity to hot/cold.    Pt accompanied by: self  PERTINENT HISTORY:  PMH: diabetes mellitus, hypertension, hyperlipidemia  Per Dr. Lydia Sams note 06/11/23: Guillain-Barre syndrome manifesting with progressive paresthesias, generalized weakness, and bulbar weakness (Jan 2024), supported by CSF findings (protein > 600) and MRI lumbar spine showing diffuse nerve root enhancement. She was treated with IVIG and made significant improvements. Exam shows mild weakness in the legs and distal sensory loss. Reflexes are present at the knees and trace at the ankles (improved). NCS/EMG was reviewed and is normal indicating that she has a very good prognosis for recovery, but that it can take months for complete resolution of symptoms.  PAIN:  Are you having pain? Yes: NPRS scale: 3/10 Nerve sensation in toes and fingers Numbness in low back/sacrum  PRECAUTIONS: Fall  PATIENT GOALS: "be able to do stairs normally, get my balance,  be able to squat or sit on the ground, go hiking, get back to my normal life"  OBJECTIVE:  Note: Objective measures were completed at Evaluation unless otherwise noted.  DIAGNOSTIC FINDINGS:  NCS/EMG of the left side 05/30/2023: This is a normal study of the left upper and lower extremities.  In particular, there is no evidence of a large fiber sensorimotor polyneuropathy or cervical/lumbosacral radiculopathy.   MRI lumbar spine 04/20/2023: 1. Diffuse thickening and enhancement of the nerve roots of the cauda equina. This is consistent with the clinical diagnosis of Guillain-Barre syndrome. The differential diagnosis includes an acute presentation of CIDP. 2. No significant disc protrusion or stenosis in the lumbar spine.   MRI cervical spine 04/15/2023: 1. Normal MRI appearance of the cervical spinal cord. 2. Mild noncompressive disc bulging at C2-3 through C6-7 without significant stenosis or overt neural impingement.   MRI brain wo contrast 04/15/2023: 1. No acute intracranial abnormality. 2. Subcentimeter remote lacunar infarct at the posterior aspect of the right lentiform nucleus. 3. Otherwise normal brain MRI.                                                                                                                              TREATMENT:  Theract: -simulated pulling weeds pulling squigz off of ground  -progressed to adding obstacle course of stepping over/around/through objects while carrying bean bag basket -kneeling over therapy bench 6lb db rows to further simulate garden tasks   PATIENT EDUCATION: Education details: peripheral neuropathy vs GBS s/s, continue POC Person educated: Patient Education method: Medical illustrator Education comprehension: verbalized understanding, returned demonstration, and needs further education  HOME EXERCISE PROGRAM: Access Code: [email or web address] URL: [email or web address] Date: 07/07/2023 Prepared by: Nickola Baron  Exercises - Standing Hip Abduction with Counter Support  - 1 x daily - 7 x weekly - 3 sets - 10 reps - Standing Hip Extension with Counter Support  - 1 x daily - 7 x weekly - 3 sets - 10 reps - Standing March with Counter Support  - 1 x daily - 7 x weekly - 3 sets - 10 reps - Mini Squat with Counter Support  - 1 x daily - 7 x weekly - 3 sets - 10 reps - Heel Raises with Counter Support  - 1 x daily - 7 x weekly - 3 sets - 10 reps - Sit to Stand  - 1 x daily - 7 x weekly - 3 sets - 5 reps - Step Up  - 1 x daily - 7 x weekly - 3 sets - 10 reps - Towel Scrunches  - 1 x daily - 7 x weekly - 3 sets - 10 reps  GOALS: Goals reviewed with patient? Yes  SHORT TERM GOALS=LONG TERM GOALS due to length of POC   LONG TERM GOALS: Target date: 07/30/2023  Pt will be independent with final HEP for improved strength, balance, transfers, endurance and gait. Baseline:  Goal status: INITIAL  2.  Pt will improve 5 x STS to less than or equal to 25 seconds to demonstrate improved functional strength and transfer efficiency.   Baseline: 29.81 sec no UE (4/7) Goal status: INITIAL  3.  Pt will improve gait velocity to at least 2.0 ft/sec for improved gait efficiency and performance at mod I level  Baseline: 1.74 ft/sec mod I (4/7) Goal status: INITIAL  4.  Pt will improve FGA to 22/30 for decreased fall risk  Baseline: 17/30 (4/7) Goal status: INITIAL  5.  Patient will ambulate >/=1031ft during to demonstrate improved endurance.  Baseline: 751ft Goal status: REVISED    ASSESSMENT:  CLINICAL IMPRESSION: Patient seen for skilled PT session with emphasis on return to leisure tasks. She remains limited by endurance, higher level balance tasks and proximal strength. She continues to benefit from skilled PT services to allow her to return to both her occupation as well as leisure tasks.    OBJECTIVE IMPAIRMENTS: Abnormal gait, cardiopulmonary status limiting activity, decreased activity  tolerance, decreased balance, decreased coordination, decreased endurance, decreased knowledge of condition, decreased mobility, difficulty walking, decreased strength, impaired perceived functional ability, impaired sensation, and pain.   ACTIVITY LIMITATIONS: carrying, lifting, bending, sitting, squatting, stairs, transfers, and bed mobility  PARTICIPATION LIMITATIONS: meal prep, cleaning, laundry, occupation, and yard work  PERSONAL FACTORS: 1-2 comorbidities:    diabetes mellitus, hypertension, hyperlipidemia are also affecting patient's functional outcome.   REHAB POTENTIAL: Excellent  CLINICAL DECISION MAKING: Stable/uncomplicated  EVALUATION COMPLEXITY: Low  PLAN:  PT FREQUENCY: 2x/week  PT DURATION: 4 weeks  PLANNED INTERVENTIONS: 97164- PT Re-evaluation, 97110-Therapeutic exercises, 97530- Therapeutic activity, 97112- Neuromuscular re-education, 97535- Self Care, 09811- Manual therapy, [phone]- Gait training, [phone]- Orthotic Fit/training, [phone]- Aquatic Therapy, [phone]- Electrical stimulation (manual), Patient/Family education, Balance training, Stair training, Taping, Dry Needling, Joint mobilization, DME instructions, Cryotherapy, and Moist heat  PLAN FOR NEXT SESSION: functional strengthening for BLE (sit to stands, step taps or step ups, lateral stepping, monster walks), confirm no pacemaker then could trial TENS for N/T in feet, work towards return to hiking and gardening, endurance   Rebecca Campus, PT Rebecca Campus, PT, DPT, CBIS  07/18/2023, 12:14 PM

## 2023-07-21 ENCOUNTER — Ambulatory Visit: Admitting: Physical Therapy

## 2023-07-21 ENCOUNTER — Telehealth: Payer: Self-pay | Admitting: Physical Therapy

## 2023-07-21 DIAGNOSIS — M6281 Muscle weakness (generalized): Secondary | ICD-10-CM

## 2023-07-21 DIAGNOSIS — R2681 Unsteadiness on feet: Secondary | ICD-10-CM

## 2023-07-21 DIAGNOSIS — R2689 Other abnormalities of gait and mobility: Secondary | ICD-10-CM

## 2023-07-21 DIAGNOSIS — R278 Other lack of coordination: Secondary | ICD-10-CM

## 2023-07-21 NOTE — Therapy (Signed)
 OUTPATIENT PHYSICAL THERAPY NEURO TREATMENT   Patient Name: Becky Shaw MRN: [id number] DOB:1971/03/02, 53 y.o., female Today's Date: 07/21/2023   PCP: Claire Crick, MD REFERRING PROVIDER: Daryel Ensign, DO  END OF SESSION:  PT End of Session - 07/21/23 1451     Visit Number 6    Number of Visits 9    Date for PT Re-Evaluation 08/11/23    Authorization Type [facility]    PT Start Time 1450   pt arrived late   PT Stop Time 1530    PT Time Calculation (min) 40 min    Equipment Utilized During Treatment Gait belt    Activity Tolerance Patient tolerated treatment well    Behavior During Therapy [facility] for tasks assessed/performed              Past Medical History:  Diagnosis Date   ALLERGIC RHINITIS 05/17/2007   GANGLION CYST, WRIST, RIGHT 05/31/2008   Hyperlipidemia    OVERACTIVE BLADDER 05/15/2007   Prediabetes    Past Surgical History:  Procedure Laterality Date   CHOLECYSTECTOMY     Patient Active Problem List   Diagnosis Date Noted   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   Neuropathy 04/19/2023   GBS (Guillain Barre syndrome) (HCC) 04/19/2023   Thyroid  nodule 04/17/2023   Postmenopausal 04/17/2023   Back pain 04/17/2023   History of stroke in adulthood 04/16/2023   Disturbance of skin sensation 04/16/2023   Elevated blood protein 04/16/2023   Paresthesias 04/14/2023   Travel advice encounter 09/16/2022   Menopausal state 09/16/2022   Borderline hypothyroidism 03/18/2022   Family history of stroke (cerebrovascular) 03/08/2022   Type 2 diabetes mellitus without complication, without long-term current use of insulin  (HCC) 12/07/2021   Dermatitis of both ear canals 08/29/2021   Hyperlipidemia associated with type 2 diabetes mellitus (HCC) 06/26/2020   Hives 10/18/2015   Essential hypertension 03/22/2015   Aphthous ulcer of mouth 12/16/2011   Health maintenance examination 12/07/2010   Allergic rhinitis 05/17/2007    ONSET DATE: 06/23/2023  (referral date, symptom onset 04/11/2023)  REFERRING DIAG: G61.0 (ICD-10-CM) - GBS (Guillain Barre syndrome) (HCC) R26.81 (ICD-10-CM) - Unsteady gait M79.2 (ICD-10-CM) - Neuropathic pain  THERAPY DIAG:  Muscle weakness (generalized)  Other abnormalities of gait and mobility  Unsteadiness on feet  Other lack of coordination  Rationale for Evaluation and Treatment: Rehabilitation  SUBJECTIVE:                                                                                                                                                                                             SUBJECTIVE STATEMENT: "[name]"  Pt reports that  she shredded some bamboo shoots over the weekend and it caused increased redness and coldness in tips of all of her fingers. Pt was cutting them for about an hour. Pt reports it took a few hours for her fingers to recover. Pt was really scared by this and thought her GBS was returning.  Pt feels like things are about 50/50 in terms of her recovery, some things are going well and others are not.  Pt reports she did notice any changes with trial of TENS, does have less pain in her L pain when pressing into her L tib ant. Pt reports she has noticed and issues with L knee pain, notices it with sit to stand or on stairs when going down.   Pt accompanied by: self  PERTINENT HISTORY:  PMH: diabetes mellitus, hypertension, hyperlipidemia  Per Dr. Lydia Sams note 06/11/23: Guillain-Barre syndrome manifesting with progressive paresthesias, generalized weakness, and bulbar weakness (Jan 2024), supported by CSF findings (protein > 600) and MRI lumbar spine showing diffuse nerve root enhancement. She was treated with IVIG and made significant improvements. Exam shows mild weakness in the legs and distal sensory loss. Reflexes are present at the knees and trace at the ankles (improved). NCS/EMG was reviewed and is normal indicating that she has a very good prognosis for recovery, but that  it can take months for complete resolution of symptoms.  PAIN:  Are you having pain? Yes: NPRS scale: 3/10 Nerve sensation in toes and fingers Numbness in low back/sacrum  PRECAUTIONS: Fall  PATIENT GOALS: "be able to do stairs normally, get my balance, be able to squat or sit on the ground, go hiking, get back to my normal life"  OBJECTIVE:  Note: Objective measures were completed at Evaluation unless otherwise noted.  DIAGNOSTIC FINDINGS:  NCS/EMG of the left side 05/30/2023: This is a normal study of the left upper and lower extremities.  In particular, there is no evidence of a large fiber sensorimotor polyneuropathy or cervical/lumbosacral radiculopathy.   MRI lumbar spine 04/20/2023: 1. Diffuse thickening and enhancement of the nerve roots of the cauda equina. This is consistent with the clinical diagnosis of Guillain-Barre syndrome. The differential diagnosis includes an acute presentation of CIDP. 2. No significant disc protrusion or stenosis in the lumbar spine.   MRI cervical spine 04/15/2023: 1. Normal MRI appearance of the cervical spinal cord. 2. Mild noncompressive disc bulging at C2-3 through C6-7 without significant stenosis or overt neural impingement.   MRI brain wo contrast 04/15/2023: 1. No acute intracranial abnormality. 2. Subcentimeter remote lacunar infarct at the posterior aspect of the right lentiform nucleus. 3. Otherwise normal brain MRI.                                                                                                                              TREATMENT:   TherAct: SciFit multi-peaks level 3 for 5 minutes using BUE/BLEs for neural priming for reciprocal movement, dynamic cardiovascular warmup and increased  amplitude of stepping. RPE of 7/10 following activity.   NMR To work on L quad strengthening in // bars: TKE with red theraband 2 x 10 reps LLE only 4" eccentric step downs with LLE on step 2 x 10 reps 6" eccentric step downs with  RLE on step x 10 reps 6" forwards step ups 2 x 10 reps LLE only  Added to HEP, see bolded below   PATIENT EDUCATION: Education details: continue HEP and added to HEP, PT POC with plan to assess goals next week Person educated: Patient Education method: Explanation, Demonstration, and Handouts Education comprehension: verbalized understanding, returned demonstration, and needs further education  HOME EXERCISE PROGRAM: Access Code: [email or web address] URL: [email or web address] Date: 07/07/2023 Prepared by: Nickola Baron  Exercises - Standing Hip Abduction with Counter Support  - 1 x daily - 7 x weekly - 3 sets - 10 reps - Standing Hip Extension with Counter Support  - 1 x daily - 7 x weekly - 3 sets - 10 reps - Standing March with Counter Support  - 1 x daily - 7 x weekly - 3 sets - 10 reps - Mini Squat with Counter Support  - 1 x daily - 7 x weekly - 3 sets - 10 reps - Heel Raises with Counter Support  - 1 x daily - 7 x weekly - 3 sets - 10 reps - Sit to Stand  - 1 x daily - 7 x weekly - 3 sets - 5 reps - Step Up  - 1 x daily - 7 x weekly - 3 sets - 10 reps - Towel Scrunches  - 1 x daily - 7 x weekly - 3 sets - 10 reps  - Standing Terminal Knee Extension with Resistance  - 1 x daily - 7 x weekly - 3 sets - 10 reps - Forward Step Down with Heel Tap and Counter Support  - 1 x daily - 7 x weekly - 3 sets - 10 reps - Forward Step Up with Counter Support  - 1 x daily - 7 x weekly - 3 sets - 10 reps  GOALS: Goals reviewed with patient? Yes  SHORT TERM GOALS=LONG TERM GOALS due to length of POC   LONG TERM GOALS: Target date: 07/30/2023  Pt will be independent with final HEP for improved strength, balance, transfers, endurance and gait. Baseline:  Goal status: INITIAL  2.  Pt will improve 5 x STS to less than or equal to 25 seconds to demonstrate improved functional strength and transfer efficiency.   Baseline: 29.81 sec no UE (4/7) Goal status: INITIAL  3.  Pt will  improve gait velocity to at least 2.0 ft/sec for improved gait efficiency and performance at mod I level  Baseline: 1.74 ft/sec mod I (4/7) Goal status: INITIAL  4.  Pt will improve FGA to 22/30 for decreased fall risk  Baseline: 17/30 (4/7) Goal status: INITIAL  5.  Patient will ambulate >/=1048ft during to demonstrate improved endurance.  Baseline: 738ft Goal status: REVISED    ASSESSMENT:  CLINICAL IMPRESSION: Emphasis of skilled PT session on focus on L knee strengthening and eccentric control for improved management of pain and adding exercises to her HEP. Discussed decreasing time spent doing fine motor tasks with hands (ex: 5-10 min instead of an hour) in order to decrease flare-ups of symptoms. Pt continues to benefit from skilled PT services to work towards LTGs. Continue POC.    OBJECTIVE IMPAIRMENTS: Abnormal gait, cardiopulmonary status limiting activity, decreased activity tolerance,  decreased balance, decreased coordination, decreased endurance, decreased knowledge of condition, decreased mobility, difficulty walking, decreased strength, impaired perceived functional ability, impaired sensation, and pain.   ACTIVITY LIMITATIONS: carrying, lifting, bending, sitting, squatting, stairs, transfers, and bed mobility  PARTICIPATION LIMITATIONS: meal prep, cleaning, laundry, occupation, and yard work  PERSONAL FACTORS: 1-2 comorbidities:    diabetes mellitus, hypertension, hyperlipidemia are also affecting patient's functional outcome.   REHAB POTENTIAL: Excellent  CLINICAL DECISION MAKING: Stable/uncomplicated  EVALUATION COMPLEXITY: Low  PLAN:  PT FREQUENCY: 2x/week  PT DURATION: 4 weeks  PLANNED INTERVENTIONS: 97164- PT Re-evaluation, 97110-Therapeutic exercises, 97530- Therapeutic activity, 97112- Neuromuscular re-education, 97535- Self Care, 44010- Manual therapy, [phone]- Gait training, [phone]- Orthotic Fit/training, [phone]- Aquatic Therapy, [phone]- Electrical  stimulation (manual), Patient/Family education, Balance training, Stair training, Taping, Dry Needling, Joint mobilization, DME instructions, Cryotherapy, and Moist heat  PLAN FOR NEXT SESSION: functional strengthening for BLE (sit to stands, step taps or step ups, lateral stepping, monster walks), work towards return to hiking and gardening, endurance, half knee to tall kneel transition, hiking outdoors   Lorita Rosa, PT Lorita Rosa, PT, DPT, CSRS   07/21/2023, 3:33 PM

## 2023-07-23 ENCOUNTER — Ambulatory Visit: Admitting: Physical Therapy

## 2023-07-23 DIAGNOSIS — R2689 Other abnormalities of gait and mobility: Secondary | ICD-10-CM

## 2023-07-23 DIAGNOSIS — R2681 Unsteadiness on feet: Secondary | ICD-10-CM

## 2023-07-23 DIAGNOSIS — M6281 Muscle weakness (generalized): Secondary | ICD-10-CM

## 2023-07-23 DIAGNOSIS — R278 Other lack of coordination: Secondary | ICD-10-CM

## 2023-07-23 NOTE — Therapy (Signed)
 OUTPATIENT PHYSICAL THERAPY NEURO TREATMENT   Patient Name: Becky Shaw MRN: [id number] DOB:1970-05-05, 53 y.o., female Today's Date: 07/23/2023   PCP: Claire Crick, MD REFERRING PROVIDER: Daryel Ensign, DO  END OF SESSION:  PT End of Session - 07/23/23 1149     Visit Number 7    Number of Visits 9    Date for PT Re-Evaluation 08/11/23    Authorization Type [facility]    PT Start Time 1148    PT Stop Time 1230    PT Time Calculation (min) 42 min    Equipment Utilized During Treatment Gait belt    Activity Tolerance Patient tolerated treatment well    Behavior During Therapy [facility] for tasks assessed/performed               Past Medical History:  Diagnosis Date   ALLERGIC RHINITIS 05/17/2007   GANGLION CYST, WRIST, RIGHT 05/31/2008   Hyperlipidemia    OVERACTIVE BLADDER 05/15/2007   Prediabetes    Past Surgical History:  Procedure Laterality Date   CHOLECYSTECTOMY     Patient Active Problem List   Diagnosis Date Noted   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   Neuropathy 04/19/2023   GBS (Guillain Barre syndrome) (HCC) 04/19/2023   Thyroid  nodule 04/17/2023   Postmenopausal 04/17/2023   Back pain 04/17/2023   History of stroke in adulthood 04/16/2023   Disturbance of skin sensation 04/16/2023   Elevated blood protein 04/16/2023   Paresthesias 04/14/2023   Travel advice encounter 09/16/2022   Menopausal state 09/16/2022   Borderline hypothyroidism 03/18/2022   Family history of stroke (cerebrovascular) 03/08/2022   Type 2 diabetes mellitus without complication, without long-term current use of insulin  (HCC) 12/07/2021   Dermatitis of both ear canals 08/29/2021   Hyperlipidemia associated with type 2 diabetes mellitus (HCC) 06/26/2020   Hives 10/18/2015   Essential hypertension 03/22/2015   Aphthous ulcer of mouth 12/16/2011   Health maintenance examination 12/07/2010   Allergic rhinitis 05/17/2007    ONSET DATE: 06/23/2023 (referral date,  symptom onset 04/11/2023)  REFERRING DIAG: G61.0 (ICD-10-CM) - GBS (Guillain Barre syndrome) (HCC) R26.81 (ICD-10-CM) - Unsteady gait M79.2 (ICD-10-CM) - Neuropathic pain  THERAPY DIAG:  Muscle weakness (generalized)  Other abnormalities of gait and mobility  Unsteadiness on feet  Other lack of coordination  Rationale for Evaluation and Treatment: Rehabilitation  SUBJECTIVE:                                                                                                                                                                                             SUBJECTIVE STATEMENT: "Becky Shaw"  Pt reports that 3 things continue  to bother her: L knee pain, needle prick pain from knee all the way into her toes (electric shock), and numbness in her tailbone area. Pt reports that she notices the needle prick pain more in the evening when she is sitting still. Pt reports she gets the pain in her tailbone if she is sitting still for too long or when she is driving - at home she can lean to the side to offload her tailbone.  Pt accompanied by: self  PERTINENT HISTORY:  PMH: diabetes mellitus, hypertension, hyperlipidemia  Per Dr. Lydia Sams note 06/11/23: Guillain-Barre syndrome manifesting with progressive paresthesias, generalized weakness, and bulbar weakness (Jan 2024), supported by CSF findings (protein > 600) and MRI lumbar spine showing diffuse nerve root enhancement. She was treated with IVIG and made significant improvements. Exam shows mild weakness in the legs and distal sensory loss. Reflexes are present at the knees and trace at the ankles (improved). NCS/EMG was reviewed and is normal indicating that she has a very good prognosis for recovery, but that it can take months for complete resolution of symptoms.  PAIN:  Are you having pain? Yes: NPRS scale: 3/10 Nerve sensation in toes and fingers Numbness in low back/sacrum  PRECAUTIONS: Fall  PATIENT GOALS: "be able to do stairs normally,  get my balance, be able to squat or sit on the ground, go hiking, get back to my normal life"  OBJECTIVE:  Note: Objective measures were completed at Evaluation unless otherwise noted.  DIAGNOSTIC FINDINGS:  NCS/EMG of the left side 05/30/2023: This is a normal study of the left upper and lower extremities.  In particular, there is no evidence of a large fiber sensorimotor polyneuropathy or cervical/lumbosacral radiculopathy.   MRI lumbar spine 04/20/2023: 1. Diffuse thickening and enhancement of the nerve roots of the cauda equina. This is consistent with the clinical diagnosis of Guillain-Barre syndrome. The differential diagnosis includes an acute presentation of CIDP. 2. No significant disc protrusion or stenosis in the lumbar spine.   MRI cervical spine 04/15/2023: 1. Normal MRI appearance of the cervical spinal cord. 2. Mild noncompressive disc bulging at C2-3 through C6-7 without significant stenosis or overt neural impingement.   MRI brain wo contrast 04/15/2023: 1. No acute intracranial abnormality. 2. Subcentimeter remote lacunar infarct at the posterior aspect of the right lentiform nucleus. 3. Otherwise normal brain MRI.                                                                                                                              TREATMENT:   NMR To work on functional LE strengthening on red mat on floor: Transition from tall kneel to half kneel x 10 reps B with intermittent UE support Resisted tall kneel minisquats with purple resistance band x 10 reps Trialed with blue theraband, verbally added to HEP Half kneel zoom ball x 10, x 20 reps L/R More difficulty balancing with LLE up vs RLE  Gait Gait pattern: [facility] Distance  walked: 1000 ft (+) Assistive device utilized: None Level of assistance: CGA Comments: gait outdoors across uneven ground, up/down curbs, up/down hills and inclines; pt initially stumbles a bit when descending hills but does better with more  repetitions   PATIENT EDUCATION: Education details: continue HEP and verbally added to HEP, PT POC with plan to assess goals next week Person educated: Patient Education method: Explanation, Demonstration, and Handouts Education comprehension: verbalized understanding, returned demonstration, and needs further education  HOME EXERCISE PROGRAM: Access Code: [email or web address] URL: [email or web address] Date: 07/07/2023 Prepared by: Nickola Baron  Exercises - Standing Hip Abduction with Counter Support  - 1 x daily - 7 x weekly - 3 sets - 10 reps - Standing Hip Extension with Counter Support  - 1 x daily - 7 x weekly - 3 sets - 10 reps - Standing March with Counter Support  - 1 x daily - 7 x weekly - 3 sets - 10 reps - Mini Squat with Counter Support  - 1 x daily - 7 x weekly - 3 sets - 10 reps - Heel Raises with Counter Support  - 1 x daily - 7 x weekly - 3 sets - 10 reps - Sit to Stand  - 1 x daily - 7 x weekly - 3 sets - 5 reps - Step Up  - 1 x daily - 7 x weekly - 3 sets - 10 reps - Towel Scrunches  - 1 x daily - 7 x weekly - 3 sets - 10 reps - Standing Terminal Knee Extension with Resistance  - 1 x daily - 7 x weekly - 3 sets - 10 reps - Forward Step Down with Heel Tap and Counter Support  - 1 x daily - 7 x weekly - 3 sets - 10 reps - Forward Step Up with Counter Support  - 1 x daily - 7 x weekly - 3 sets - 10 reps  Verbally added 4/30: -resisted tall kneel mini-squats with blue theraband 3 x 10 reps  GOALS: Goals reviewed with patient? Yes  SHORT TERM GOALS=LONG TERM GOALS due to length of POC   LONG TERM GOALS: Target date: 07/30/2023  Pt will be independent with final HEP for improved strength, balance, transfers, endurance and gait. Baseline:  Goal status: INITIAL  2.  Pt will improve 5 x STS to less than or equal to 25 seconds to demonstrate improved functional strength and transfer efficiency.   Baseline: 29.81 sec no UE (4/7) Goal status: INITIAL  3.  Pt  will improve gait velocity to at least 2.0 ft/sec for improved gait efficiency and performance at mod I level  Baseline: 1.74 ft/sec mod I (4/7) Goal status: INITIAL  4.  Pt will improve FGA to 22/30 for decreased fall risk  Baseline: 17/30 (4/7) Goal status: INITIAL  5.  Patient will ambulate >/=1039ft during to demonstrate improved endurance.  Baseline: 740ft Goal status: REVISED    ASSESSMENT:  CLINICAL IMPRESSION: Emphasis of skilled PT session on continuing to work on BLE NMR in tall kneel and half kneel positions. Pt with more difficulty maintaining balance in half-kneel with LLE up but improves with increased practice. Also worked on gait outdoors as pt enjoys hiking, pt initially with minor stumbling when descending a hill of uneven ground but does better with increased practice and repetitions. Pt continues to benefit from skilled PT services to work towards LTGs. Continue POC.    OBJECTIVE IMPAIRMENTS: Abnormal gait, cardiopulmonary status limiting activity, decreased activity tolerance, decreased balance,  decreased coordination, decreased endurance, decreased knowledge of condition, decreased mobility, difficulty walking, decreased strength, impaired perceived functional ability, impaired sensation, and pain.   ACTIVITY LIMITATIONS: carrying, lifting, bending, sitting, squatting, stairs, transfers, and bed mobility  PARTICIPATION LIMITATIONS: meal prep, cleaning, laundry, occupation, and yard work  PERSONAL FACTORS: 1-2 comorbidities:    diabetes mellitus, hypertension, hyperlipidemia are also affecting patient's functional outcome.   REHAB POTENTIAL: Excellent  CLINICAL DECISION MAKING: Stable/uncomplicated  EVALUATION COMPLEXITY: Low  PLAN:  PT FREQUENCY: 2x/week  PT DURATION: 4 weeks  PLANNED INTERVENTIONS: 97164- PT Re-evaluation, 97110-Therapeutic exercises, 97530- Therapeutic activity, 97112- Neuromuscular re-education, 97535- Self Care, 16109- Manual  therapy, [phone]- Gait training, [phone]- Orthotic Fit/training, [phone]- Aquatic Therapy, [phone]- Electrical stimulation (manual), Patient/Family education, Balance training, Stair training, Taping, Dry Needling, Joint mobilization, DME instructions, Cryotherapy, and Moist heat  PLAN FOR NEXT SESSION: functional strengthening for BLE (sit to stands, step taps or step ups, lateral stepping, monster walks), work towards return to hiking and gardening, endurance, half kneel to tall kneel transition, quadruped, hiking outdoors, weighted lunges   Lorita Rosa, PT Lorita Rosa, PT, DPT, CSRS   07/23/2023, 12:32 PM

## 2023-07-28 ENCOUNTER — Ambulatory Visit: Attending: Neurology | Admitting: Physical Therapy

## 2023-07-28 DIAGNOSIS — R2689 Other abnormalities of gait and mobility: Secondary | ICD-10-CM | POA: Insufficient documentation

## 2023-07-28 DIAGNOSIS — R2681 Unsteadiness on feet: Secondary | ICD-10-CM | POA: Diagnosis present

## 2023-07-28 DIAGNOSIS — M6281 Muscle weakness (generalized): Secondary | ICD-10-CM | POA: Diagnosis present

## 2023-07-28 DIAGNOSIS — R278 Other lack of coordination: Secondary | ICD-10-CM | POA: Insufficient documentation
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
## 2023-07-30 ENCOUNTER — Ambulatory Visit: Admitting: Physical Therapy

## 2023-07-30 DIAGNOSIS — R278 Other lack of coordination: Secondary | ICD-10-CM

## 2023-07-30 DIAGNOSIS — R2681 Unsteadiness on feet: Secondary | ICD-10-CM

## 2023-07-30 DIAGNOSIS — R2689 Other abnormalities of gait and mobility: Secondary | ICD-10-CM

## 2023-07-30 DIAGNOSIS — M6281 Muscle weakness (generalized): Secondary | ICD-10-CM | POA: Diagnosis not present

## 2023-07-30 NOTE — Therapy (Signed)
 OUTPATIENT PHYSICAL THERAPY NEURO TREATMENT   Patient Name: Becky Shaw MRN: [id number] DOB:1971/01/20, 53 y.o., female Today's Date: 07/30/2023   PCP: Becky Crick, MD REFERRING PROVIDER: Daryel Ensign, DO  END OF SESSION:  PT End of Session - 07/30/23 1317     Visit Number 9    Number of Visits 16   recert   Date for PT Re-Evaluation 09/08/23   recert, to allow for scheduling delays   Authorization Type [facility]    PT Start Time 1315    PT Stop Time 1357    PT Time Calculation (min) 42 min    Equipment Utilized During Treatment Gait belt    Activity Tolerance Patient tolerated treatment well    Behavior During Therapy [facility] for tasks assessed/performed                Past Medical History:  Diagnosis Date   ALLERGIC RHINITIS 05/17/2007   GANGLION CYST, WRIST, RIGHT 05/31/2008   Hyperlipidemia    OVERACTIVE BLADDER 05/15/2007   Prediabetes    Past Surgical History:  Procedure Laterality Date   CHOLECYSTECTOMY     Patient Active Problem List   Diagnosis Date Noted   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   Neuropathy 04/19/2023   GBS (Guillain Barre syndrome) (HCC) 04/19/2023   Thyroid  nodule 04/17/2023   Postmenopausal 04/17/2023   Back pain 04/17/2023   History of stroke in adulthood 04/16/2023   Disturbance of skin sensation 04/16/2023   Elevated blood protein 04/16/2023   Paresthesias 04/14/2023   Travel advice encounter 09/16/2022   Menopausal state 09/16/2022   Borderline hypothyroidism 03/18/2022   Family history of stroke (cerebrovascular) 03/08/2022   Type 2 diabetes mellitus without complication, without long-term current use of insulin  (HCC) 12/07/2021   Dermatitis of both ear canals 08/29/2021   Hyperlipidemia associated with type 2 diabetes mellitus (HCC) 06/26/2020   Hives 10/18/2015   Essential hypertension 03/22/2015   Aphthous ulcer of mouth 12/16/2011   Health maintenance examination 12/07/2010   Allergic rhinitis 05/17/2007     ONSET DATE: 06/23/2023 (referral date, symptom onset 04/11/2023)  REFERRING DIAG: G61.0 (ICD-10-CM) - GBS (Guillain Barre syndrome) (HCC) R26.81 (ICD-10-CM) - Unsteady gait M79.2 (ICD-10-CM) - Neuropathic pain  THERAPY DIAG:  Muscle weakness (generalized)  Unsteadiness on feet  Other abnormalities of gait and mobility  Other lack of coordination  Rationale for Evaluation and Treatment: Rehabilitation  SUBJECTIVE:  SUBJECTIVE STATEMENT: "Becky Shaw"  Pt denies any acute changes since last visit, no falls.  Pt with more electric shock pain in her Achilles region, alternates between sides. She is also sensitive to pressure in her shins on both sides but not to light touch.  She had to move her appt with Dr. Lydia Sams to July 1st.  Pt accompanied by: self  PERTINENT HISTORY:  PMH: diabetes mellitus, hypertension, hyperlipidemia  Per Dr. Lydia Sams note 06/11/23: Guillain-Barre syndrome manifesting with progressive paresthesias, generalized weakness, and bulbar weakness (Jan 2024), supported by CSF findings (protein > 600) and MRI lumbar spine showing diffuse nerve root enhancement. She was treated with IVIG and made significant improvements. Exam shows mild weakness in the legs and distal sensory loss. Reflexes are present at the knees and trace at the ankles (improved). NCS/EMG was reviewed and is normal indicating that she has a very good prognosis for recovery, but that it can take months for complete resolution of symptoms.  PAIN:  Are you having pain? Yes: NPRS scale: 3/10 Nerve sensation in toes and fingers Numbness in low back/sacrum  PRECAUTIONS: Fall  PATIENT GOALS: "be able to do stairs normally, get my balance, be able to squat or sit on the ground, go hiking, get back to my normal  life"  OBJECTIVE:  Note: Objective measures were completed at Evaluation unless otherwise noted.  DIAGNOSTIC FINDINGS:  NCS/EMG of the left side 05/30/2023: This is a normal study of the left upper and lower extremities.  In particular, there is no evidence of a large fiber sensorimotor polyneuropathy or cervical/lumbosacral radiculopathy.   MRI lumbar spine 04/20/2023: 1. Diffuse thickening and enhancement of the nerve roots of the cauda equina. This is consistent with the clinical diagnosis of Guillain-Barre syndrome. The differential diagnosis includes an acute presentation of CIDP. 2. No significant disc protrusion or stenosis in the lumbar spine.   MRI cervical spine 04/15/2023: 1. Normal MRI appearance of the cervical spinal cord. 2. Mild noncompressive disc bulging at C2-3 through C6-7 without significant stenosis or overt neural impingement.   MRI brain wo contrast 04/15/2023: 1. No acute intracranial abnormality. 2. Subcentimeter remote lacunar infarct at the posterior aspect of the right lentiform nucleus. 3. Otherwise normal brain MRI.                                                                                                                              TREATMENT:   TherEx SciFit multi-peaks level 3 for 8 minutes using BUE/BLEs for neural priming for reciprocal movement, dynamic cardiovascular warmup and increased amplitude of stepping. RPE of 7/10 following activity.    NMR To work on functional LE strengthening: Resisted 6" step taps with red TB 2 x 10 reps B Resisted lateral sidesteps with red TB 3 x 10 ft L/R Resisted monster walks forwards 6 x 10 ft Resisted monster walks backwards 3 x 10 ft  Added to HEP, see bolded below    PATIENT EDUCATION:  Education details: continue HEP and added to HEP Person educated: Patient Education method: Explanation, Demonstration, and Handouts Education comprehension: verbalized understanding, returned demonstration, and  needs further education  HOME EXERCISE PROGRAM: Access Code: [email or web address] URL: [email or web address] Date: 07/07/2023 Prepared by: Nickola Baron  Exercises - Standing Hip Abduction with Counter Support  - 1 x daily - 7 x weekly - 3 sets - 10 reps - Standing Hip Extension with Counter Support  - 1 x daily - 7 x weekly - 3 sets - 10 reps - Standing March with Counter Support  - 1 x daily - 7 x weekly - 3 sets - 10 reps - Mini Squat with Counter Support  - 1 x daily - 7 x weekly - 3 sets - 10 reps - Heel Raises with Counter Support  - 1 x daily - 7 x weekly - 3 sets - 10 reps - Sit to Stand  - 1 x daily - 7 x weekly - 3 sets - 5 reps - Step Up  - 1 x daily - 7 x weekly - 3 sets - 10 reps - Towel Scrunches  - 1 x daily - 7 x weekly - 3 sets - 10 reps - Standing Terminal Knee Extension with Resistance  - 1 x daily - 7 x weekly - 3 sets - 10 reps - Forward Step Down with Heel Tap and Counter Support  - 1 x daily - 7 x weekly - 3 sets - 10 reps - Forward Step Up with Counter Support  - 1 x daily - 7 x weekly - 3 sets - 10 reps  Verbally added 4/30: -resisted tall kneel mini-squats with blue theraband 3 x 10 reps  - Clamshell  - 1 x daily - 7 x weekly - 3 sets - 10 reps - Side Stepping with Resistance at Ankles and Counter Support  - 1 x daily - 7 x weekly - 3 sets - 10 reps - Forward and Backward Monster Walk with Resistance at Ankles and Counter Support  - 1 x daily - 7 x weekly - 3 sets - 10 reps  GOALS: Goals reviewed with patient? Yes  SHORT TERM GOALS=LONG TERM GOALS due to length of POC   LONG TERM GOALS: Target date: 07/30/2023  Pt will be independent with final HEP for improved strength, balance, transfers, endurance and gait. Baseline: pt independent with current HEP but additions ongoing (5/5) Goal status: IN PROGRESS  2.  Pt will improve 5 x STS to less than or equal to 25 seconds to demonstrate improved functional strength and transfer efficiency.  Baseline:  29.81 sec no UE (4/7), 16.35 sec no UE (5/5) Goal status: MET  3.  Pt will improve gait velocity to at least 2.0 ft/sec for improved gait efficiency and performance at mod I level  Baseline: 1.74 ft/sec mod I (4/7), 2.64 ft/sec mod I (5/5) Goal status: MET  4.  Pt will improve FGA to 22/30 for decreased fall risk  Baseline: 17/30 (4/7), 22/30 (5/5) Goal status: MET  5.  Patient will ambulate >/=1057ft during to demonstrate improved endurance.  Baseline: 750ft, 908 ft (5/5) Goal status: IN PROGRESS  NEW SHORT TERM GOALS=LONG TERM GOALS due to length of POC   NEW LONG TERM GOALS:  Target date: 08/25/2023  Pt will be independent with final HEP for improved strength, balance, transfers, endurance and gait. Baseline: pt independent with current HEP but additions ongoing (5/5) Goal status: IN PROGRESS  2.  Pt will improve  5 x STS to less than or equal to 15 seconds to demonstrate improved functional strength and transfer efficiency.  Baseline: 29.81 sec no UE (4/7), 16.35 sec no UE (5/5) Goal status: REVISED/UPGRADED  3.  Pt will improve gait velocity to at least 2.8 ft/sec for improved gait efficiency and performance at mod I level  Baseline: 1.74 ft/sec mod I (4/7), 2.64 ft/sec mod I (5/5) Goal status: REVISED/UPGRADED  4.  Pt will improve FGA to 26/30 for decreased fall risk  Baseline: 17/30 (4/7), 22/30 (5/5) Goal status: REVISED/UPGRADED  5.  Patient will ambulate >/=1075ft during to demonstrate improved endurance.  Baseline: 742ft, 908 ft (5/5) Goal status: IN PROGRESS     ASSESSMENT:  CLINICAL IMPRESSION: Emphasis of skilled PT session on continuing to work on functional strengthening and NMR of BLE. Pt challenged by SciFit and resisted exercises this date, added to her HEP. Plan to revise/condense her HEP next visit to ensure it is still challenging and appropriate for her. Pt continues to benefit from skilled PT services to work towards return to PLOF.  Continue POC.    OBJECTIVE IMPAIRMENTS: Abnormal gait, cardiopulmonary status limiting activity, decreased activity tolerance, decreased balance, decreased coordination, decreased endurance, decreased knowledge of condition, decreased mobility, difficulty walking, decreased strength, impaired perceived functional ability, impaired sensation, and pain.   ACTIVITY LIMITATIONS: carrying, lifting, bending, sitting, squatting, stairs, transfers, and bed mobility  PARTICIPATION LIMITATIONS: meal prep, cleaning, laundry, occupation, and yard work  PERSONAL FACTORS: 1-2 comorbidities:    diabetes mellitus, hypertension, hyperlipidemia are also affecting patient's functional outcome.   REHAB POTENTIAL: Excellent  CLINICAL DECISION MAKING: Stable/uncomplicated  EVALUATION COMPLEXITY: Low  PLAN:  PT FREQUENCY: 2x/week x 2 weeks then 1x/week for 2 weeks (recert)  PT DURATION: 4 weeks + 4 weeks (recert)  PLANNED INTERVENTIONS: 16109- PT Re-evaluation, 97110-Therapeutic exercises, 97530- Therapeutic activity, 97112- Neuromuscular re-education, 97535- Self Care, 60454- Manual therapy, [phone]- Gait training, [phone]- Orthotic Fit/training, [phone]- Aquatic Therapy, [phone]- Electrical stimulation (manual), Patient/Family education, Balance training, Stair training, Taping, Dry Needling, Joint mobilization, DME instructions, Cryotherapy, and Moist heat  PLAN FOR NEXT SESSION: 10th PN, functional strengthening for BLE (sit to stands, step taps or step ups, lateral stepping, monster walks), work towards return to hiking and gardening, endurance, half kneel to tall kneel transition, quadruped, hiking outdoors, L knee strengthening, B hip strengthening, SLS, Bosu squats, SciFit  Try TENS again next week if patient agreeable to her shin area, revise/condense HEP   Lorita Rosa, PT Lorita Rosa, PT, DPT, CSRS   07/30/2023, 1:57 PM

## 2023-08-04 ENCOUNTER — Ambulatory Visit: Admitting: Physical Therapy

## 2023-08-04 DIAGNOSIS — R278 Other lack of coordination: Secondary | ICD-10-CM

## 2023-08-04 DIAGNOSIS — R2689 Other abnormalities of gait and mobility: Secondary | ICD-10-CM

## 2023-08-04 DIAGNOSIS — R2681 Unsteadiness on feet: Secondary | ICD-10-CM

## 2023-08-04 DIAGNOSIS — M6281 Muscle weakness (generalized): Secondary | ICD-10-CM

## 2023-08-04 NOTE — Therapy (Signed)
 OUTPATIENT PHYSICAL THERAPY NEURO TREATMENT - 10th VISIT PROGRESS NOTE   Patient Name: Becky Shaw MRN: [id number] DOB:[birth year], 53 y.o., female Today's Date: 08/04/2023   PCP: Claire Crick, MD REFERRING PROVIDER: Patel, Donika K, DO  Physical Therapy Progress Note   Dates of Reporting Period: 06/30/2023 - 08/04/2023  See Note below for Objective Data and Assessment of Progress/Goals.  Thank you for the referral of this patient. Lorita Rosa, PT, DPT, CSRS   END OF SESSION:    PT End of Session - 08/04/23 1317     Visit Number 10    Number of Visits 16   recert   Date for PT Re-Evaluation 09/08/23   recert, to allow for scheduling delays   Authorization Type [facility]    PT Start Time 1315    PT Stop Time 1358    PT Time Calculation (min) 43 min    Equipment Utilized During Treatment Gait belt    Activity Tolerance Patient tolerated treatment well    Behavior During Therapy [facility] for tasks assessed/performed                Past Medical History:  Diagnosis Date   ALLERGIC RHINITIS 05/17/2007   GANGLION CYST, WRIST, RIGHT 05/31/2008   Hyperlipidemia    OVERACTIVE BLADDER 05/15/2007   Prediabetes    Past Surgical History:  Procedure Laterality Date   CHOLECYSTECTOMY     Patient Active Problem List   Diagnosis Date Noted   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   Neuropathy 04/19/2023   GBS (Guillain Barre syndrome) (HCC) 04/19/2023   Thyroid  nodule 04/17/2023   Postmenopausal 04/17/2023   Back pain 04/17/2023   History of stroke in adulthood 04/16/2023   Disturbance of skin sensation 04/16/2023   Elevated blood protein 04/16/2023   Paresthesias 04/14/2023   Travel advice encounter 09/16/2022   Menopausal state 09/16/2022   Borderline hypothyroidism 03/18/2022   Family history of stroke (cerebrovascular) 03/08/2022   Type 2 diabetes mellitus without complication, without long-term current use of insulin  (HCC) 12/07/2021   Dermatitis of both  ear canals 08/29/2021   Hyperlipidemia associated with type 2 diabetes mellitus (HCC) 06/26/2020   Hives 10/18/2015   Essential hypertension 03/22/2015   Aphthous ulcer of mouth 12/16/2011   Health maintenance examination 12/07/2010   Allergic rhinitis 05/17/2007    ONSET DATE: 06/23/2023 (referral date, symptom onset 04/11/2023)  REFERRING DIAG: G61.0 (ICD-10-CM) - GBS (Guillain Barre syndrome) (HCC) R26.81 (ICD-10-CM) - Unsteady gait M79.2 (ICD-10-CM) - Neuropathic pain  THERAPY DIAG:  Muscle weakness (generalized)  Unsteadiness on feet  Other abnormalities of gait and mobility  Other lack of coordination  Rationale for Evaluation and Treatment: Rehabilitation  SUBJECTIVE:  SUBJECTIVE STATEMENT: "[name]"  Pt denies any acute changes since last visit, no falls.  Pt did have numbness in her L lateral thigh initially with GBS but it has now has turned into a burning sensation, she started noticing it more at the end of last week and over the weekend. She also felt burning in her toes over the weekend which was new.  Pt continues to work on stretching and doing her exercises, feels like the flexibility in her legs is improving. Pt continues to have pain in her tailbone region when sitting for extended period of time, has a hard time sitting in the car for longer trips.  Pt accompanied by: self  PERTINENT HISTORY:  PMH: diabetes mellitus, hypertension, hyperlipidemia  Per Dr. Lydia Sams note 06/11/23: Guillain-Barre syndrome manifesting with progressive paresthesias, generalized weakness, and bulbar weakness (Jan 2024), supported by CSF findings (protein > 600) and MRI lumbar spine showing diffuse nerve root enhancement. She was treated with IVIG and made significant improvements. Exam shows mild weakness  in the legs and distal sensory loss. Reflexes are present at the knees and trace at the ankles (improved). NCS/EMG was reviewed and is normal indicating that she has a very good prognosis for recovery, but that it can take months for complete resolution of symptoms.  PAIN:  Are you having pain? Yes: NPRS scale: 3/10 Nerve sensation in toes and fingers Numbness in low back/sacrum  PRECAUTIONS: Fall  PATIENT GOALS: "be able to do stairs normally, get my balance, be able to squat or sit on the ground, go hiking, get back to my normal life"  OBJECTIVE:  Note: Objective measures were completed at Evaluation unless otherwise noted.  DIAGNOSTIC FINDINGS:  NCS/EMG of the left side 05/30/2023: This is a normal study of the left upper and lower extremities.  In particular, there is no evidence of a large fiber sensorimotor polyneuropathy or cervical/lumbosacral radiculopathy.   MRI lumbar spine 04/20/2023: 1. Diffuse thickening and enhancement of the nerve roots of the cauda equina. This is consistent with the clinical diagnosis of Guillain-Barre syndrome. The differential diagnosis includes an acute presentation of CIDP. 2. No significant disc protrusion or stenosis in the lumbar spine.   MRI cervical spine 04/15/2023: 1. Normal MRI appearance of the cervical spinal cord. 2. Mild noncompressive disc bulging at C2-3 through C6-7 without significant stenosis or overt neural impingement.   MRI brain wo contrast 04/15/2023: 1. No acute intracranial abnormality. 2. Subcentimeter remote lacunar infarct at the posterior aspect of the right lentiform nucleus. 3. Otherwise normal brain MRI.                                                                                                                              TREATMENT:   TherEx Review of HEP: Standing hip abd x 10 reps B Removed from HEP as pt already has a progression of this exercise Standing hip ext x 10 reps B Standing marches x 10 reps  B Standing mini-squats Onset of L knee pain, removed this from HEP Standing heel raises x 10 reps  Will review remainder of HEP next visit   Electrical Stimulation TENS level 23 x 10 min to L lateral thigh region for pain modulation Pt feels like TENS is similar to sensation that she gets when she has nerve pain in this area, minus the heat  TENS level 23 x 10 min to L ant tib for pain modulation Pt feels like TENS is similar to her nerve sensations and feels it all the way down into her toes, minus the burning sensation  No adverse skin reactions noted when electrodes removed. Pt not longer feels nerve sensation in L lateral thigh region following TENS, will monitor response over the next few days.    PATIENT EDUCATION: Education details: continue HEP with removal of exercises, TENS Person educated: Patient Education method: Medical illustrator Education comprehension: verbalized understanding, returned demonstration, and needs further education  HOME EXERCISE PROGRAM: Access Code: [email or web address] URL: [email or web address] Date: 07/07/2023 Prepared by: Nickola Baron  Exercises - Standing Hip Extension with Counter Support  - 1 x daily - 7 x weekly - 3 sets - 10 reps - Standing March with Counter Support  - 1 x daily - 7 x weekly - 3 sets - 10 reps - Heel Raises with Counter Support  - 1 x daily - 7 x weekly - 3 sets - 10 reps  - Sit to Stand  - 1 x daily - 7 x weekly - 3 sets - 5 reps - Step Up  - 1 x daily - 7 x weekly - 3 sets - 10 reps - Towel Scrunches  - 1 x daily - 7 x weekly - 3 sets - 10 reps - Standing Terminal Knee Extension with Resistance  - 1 x daily - 7 x weekly - 3 sets - 10 reps - Forward Step Down with Heel Tap and Counter Support  - 1 x daily - 7 x weekly - 3 sets - 10 reps - Forward Step Up with Counter Support  - 1 x daily - 7 x weekly - 3 sets - 10 reps  Verbally added 4/30: -resisted tall kneel mini-squats with blue theraband 3 x  10 reps  - Clamshell  - 1 x daily - 7 x weekly - 3 sets - 10 reps - Side Stepping with Resistance at Ankles and Counter Support  - 1 x daily - 7 x weekly - 3 sets - 10 reps - Forward and Backward Monster Walk with Resistance at Ankles and Counter Support  - 1 x daily - 7 x weekly - 3 sets - 10 reps  Bolded = not reviewed yet  GOALS: Goals reviewed with patient? Yes  SHORT TERM GOALS=LONG TERM GOALS due to length of POC   LONG TERM GOALS: Target date: 07/30/2023  Pt will be independent with final HEP for improved strength, balance, transfers, endurance and gait. Baseline: pt independent with current HEP but additions ongoing (5/5) Goal status: IN PROGRESS  2.  Pt will improve 5 x STS to less than or equal to 25 seconds to demonstrate improved functional strength and transfer efficiency.  Baseline: 29.81 sec no UE (4/7), 16.35 sec no UE (5/5) Goal status: MET  3.  Pt will improve gait velocity to at least 2.0 ft/sec for improved gait efficiency and performance at mod I level  Baseline: 1.74 ft/sec mod I (4/7), 2.64 ft/sec mod I (5/5) Goal status: MET  4.  Pt will improve FGA to 22/30 for decreased fall risk  Baseline: 17/30 (4/7), 22/30 (5/5) Goal status: MET  5.  Patient will ambulate >/=1058ft during to demonstrate improved endurance.  Baseline: 766ft, 908 ft (5/5) Goal status: IN PROGRESS  NEW SHORT TERM GOALS=LONG TERM GOALS due to length of POC   NEW LONG TERM GOALS:  Target date: 08/25/2023  Pt will be independent with final HEP for improved strength, balance, transfers, endurance and gait. Baseline: pt independent with current HEP but additions ongoing (5/5) Goal status: IN PROGRESS  2.  Pt will improve 5 x STS to less than or equal to 15 seconds to demonstrate improved functional strength and transfer efficiency.  Baseline: 29.81 sec no UE (4/7), 16.35 sec no UE (5/5) Goal status: REVISED/UPGRADED  3.  Pt will improve gait velocity to at least 2.8 ft/sec for  improved gait efficiency and performance at mod I level  Baseline: 1.74 ft/sec mod I (4/7), 2.64 ft/sec mod I (5/5) Goal status: REVISED/UPGRADED  4.  Pt will improve FGA to 26/30 for decreased fall risk  Baseline: 17/30 (4/7), 22/30 (5/5) Goal status: REVISED/UPGRADED  5.  Patient will ambulate >/=1023ft during to demonstrate improved endurance.  Baseline: 739ft, 908 ft (5/5) Goal status: IN PROGRESS     ASSESSMENT:  CLINICAL IMPRESSION: Emphasis of skilled PT session on trialing TENS again for treatment of neuropathic pain in LLE as well as initiating review of patient's HEP with purpose of condensing her HEP. Pt with good response to TENS this date with a decrease in nerve sensations in her L lateral thigh following treatment. Pt will monitor her response over the next few days and report back next session. See above for HEP modifications, will continue to revise her HEP next visit. Continue POC.    OBJECTIVE IMPAIRMENTS: Abnormal gait, cardiopulmonary status limiting activity, decreased activity tolerance, decreased balance, decreased coordination, decreased endurance, decreased knowledge of condition, decreased mobility, difficulty walking, decreased strength, impaired perceived functional ability, impaired sensation, and pain.   ACTIVITY LIMITATIONS: carrying, lifting, bending, sitting, squatting, stairs, transfers, and bed mobility  PARTICIPATION LIMITATIONS: meal prep, cleaning, laundry, occupation, and yard work  PERSONAL FACTORS: 1-2 comorbidities:   diabetes mellitus, hypertension, hyperlipidemia are also affecting patient's functional outcome.   REHAB POTENTIAL: Excellent  CLINICAL DECISION MAKING: Stable/uncomplicated  EVALUATION COMPLEXITY: Low  PLAN:  PT FREQUENCY: 2x/week x 2 weeks then 1x/week for 2 weeks (recert)  PT DURATION: 4 weeks + 4 weeks (recert)  PLANNED INTERVENTIONS: 16109- PT Re-evaluation, 97110-Therapeutic exercises, 97530- Therapeutic  activity, 97112- Neuromuscular re-education, 97535- Self Care, 60454- Manual therapy, [phone]- Gait training, [phone]- Orthotic Fit/training, [phone]- Aquatic Therapy, [phone]- Electrical stimulation (manual), Patient/Family education, Balance training, Stair training, Taping, Dry Needling, Joint mobilization, DME instructions, Cryotherapy, and Moist heat  PLAN FOR NEXT SESSION: functional strengthening for BLE (sit to stands, step taps or step ups, lateral stepping, monster walks), work towards return to hiking and gardening, endurance, half kneel to tall kneel transition, quadruped, hiking outdoors, L knee strengthening, B hip strengthening, SLS, Bosu squats, SciFit level 3  Response to TENS?, continue to revise/condense HEP   Lorita Rosa, PT Lorita Rosa, PT, DPT, CSRS   08/04/2023, 1:58 PM

## 2023-08-06 ENCOUNTER — Ambulatory Visit: Admitting: Physical Therapy

## 2023-08-06 DIAGNOSIS — M6281 Muscle weakness (generalized): Secondary | ICD-10-CM

## 2023-08-06 DIAGNOSIS — R2689 Other abnormalities of gait and mobility: Secondary | ICD-10-CM

## 2023-08-06 DIAGNOSIS — R278 Other lack of coordination: Secondary | ICD-10-CM

## 2023-08-06 DIAGNOSIS — R2681 Unsteadiness on feet: Secondary | ICD-10-CM

## 2023-08-06 NOTE — Therapy (Signed)
 OUTPATIENT PHYSICAL THERAPY NEURO TREATMENT   Patient Name: Becky Shaw MRN: [id number] DOB:[birth year], 53 y.o., female Today's Date: 08/06/2023   PCP: Claire Crick, MD REFERRING PROVIDER: Daryel Ensign, DO    END OF SESSION:   PT End of Session - 08/06/23 1318     Visit Number 11    Number of Visits 16   recert   Date for PT Re-Evaluation 09/08/23   recert, to allow for scheduling delays   Authorization Type [facility]    PT Start Time 1315    PT Stop Time 1400    PT Time Calculation (min) 45 min    Equipment Utilized During Treatment Gait belt    Activity Tolerance Patient tolerated treatment well    Behavior During Therapy [facility] for tasks assessed/performed                Past Medical History:  Diagnosis Date   ALLERGIC RHINITIS 05/17/2007   GANGLION CYST, WRIST, RIGHT 05/31/2008   Hyperlipidemia    OVERACTIVE BLADDER 05/15/2007   Prediabetes    Past Surgical History:  Procedure Laterality Date   CHOLECYSTECTOMY     Patient Active Problem List   Diagnosis Date Noted   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   Neuropathy 04/19/2023   GBS (Guillain Barre syndrome) (HCC) 04/19/2023   Thyroid  nodule 04/17/2023   Postmenopausal 04/17/2023   Back pain 04/17/2023   History of stroke in adulthood 04/16/2023   Disturbance of skin sensation 04/16/2023   Elevated blood protein 04/16/2023   Paresthesias 04/14/2023   Travel advice encounter 09/16/2022   Menopausal state 09/16/2022   Borderline hypothyroidism 03/18/2022   Family history of stroke (cerebrovascular) 03/08/2022   Type 2 diabetes mellitus without complication, without long-term current use of insulin  (HCC) 12/07/2021   Dermatitis of both ear canals 08/29/2021   Hyperlipidemia associated with type 2 diabetes mellitus (HCC) 06/26/2020   Hives 10/18/2015   Essential hypertension 03/22/2015   Aphthous ulcer of mouth 12/16/2011   Health maintenance examination 12/07/2010   Allergic rhinitis  05/17/2007    ONSET DATE: 06/23/2023 (referral date, symptom onset 04/11/2023)  REFERRING DIAG: G61.0 (ICD-10-CM) - GBS (Guillain Barre syndrome) (HCC) R26.81 (ICD-10-CM) - Unsteady gait M79.2 (ICD-10-CM) - Neuropathic pain  THERAPY DIAG:  Muscle weakness (generalized)  Unsteadiness on feet  Other abnormalities of gait and mobility  Other lack of coordination  Rationale for Evaluation and Treatment: Rehabilitation  SUBJECTIVE:  SUBJECTIVE STATEMENT: "[name]"  Pt denies any acute changes since last visit, no falls.  Pt didn't notice any change in her nerve symptoms after last PT visit with TENS. Otherwise pt is feeling the same today. Pt reports that the pain in her toes is worse when it rains or is cloudy, especially in last 2 toes.  Pt accompanied by: self  PERTINENT HISTORY:  PMH: diabetes mellitus, hypertension, hyperlipidemia  Per Dr. Lydia Sams note 06/11/23: Guillain-Barre syndrome manifesting with progressive paresthesias, generalized weakness, and bulbar weakness (Jan 2024), supported by CSF findings (protein > 600) and MRI lumbar spine showing diffuse nerve root enhancement. She was treated with IVIG and made significant improvements. Exam shows mild weakness in the legs and distal sensory loss. Reflexes are present at the knees and trace at the ankles (improved). NCS/EMG was reviewed and is normal indicating that she has a very good prognosis for recovery, but that it can take months for complete resolution of symptoms.  PAIN:  Are you having pain? Yes: NPRS scale: 3/10 Nerve sensation in toes and fingers Numbness in low back/sacrum  PRECAUTIONS: Fall  PATIENT GOALS: "be able to do stairs normally, get my balance, be able to squat or sit on the ground, go hiking, get back to my normal  life"  OBJECTIVE:  Note: Objective measures were completed at Evaluation unless otherwise noted.  DIAGNOSTIC FINDINGS:  NCS/EMG of the left side 05/30/2023: This is a normal study of the left upper and lower extremities.  In particular, there is no evidence of a large fiber sensorimotor polyneuropathy or cervical/lumbosacral radiculopathy.   MRI lumbar spine 04/20/2023: 1. Diffuse thickening and enhancement of the nerve roots of the cauda equina. This is consistent with the clinical diagnosis of Guillain-Barre syndrome. The differential diagnosis includes an acute presentation of CIDP. 2. No significant disc protrusion or stenosis in the lumbar spine.   MRI cervical spine 04/15/2023: 1. Normal MRI appearance of the cervical spinal cord. 2. Mild noncompressive disc bulging at C2-3 through C6-7 without significant stenosis or overt neural impingement.   MRI brain wo contrast 04/15/2023: 1. No acute intracranial abnormality. 2. Subcentimeter remote lacunar infarct at the posterior aspect of the right lentiform nucleus. 3. Otherwise normal brain MRI.                                                                                                                              TREATMENT:   TherEx SciFit multi-peaks level 3.5 for 8 minutes using BUE/BLEs for neural priming for reciprocal movement, dynamic cardiovascular warmup and increased amplitude of stepping. RPE of 7/10 following activity.  Continued review of HEP: Sit to stands no UE x 10 reps Alt L/R 6" step ups x 10 reps B Alt L/R 4" step downs x 10 reps B Resisted sidesteps 3 x 10 ft with red TB Resisted monster walks forwards/backwards 3 x 10 ft each direction with red TB TKE with red TB x 10 reps LLE  Tall kneel resisted mini-squats x 10 reps Seated towel scrunches with toes x 10 reps B Sidelying clamshells x 10 reps B   Electrical Stimulation Information provided on where to purchase TENS unit if patient is  interested.    PATIENT EDUCATION: Education details: continue HEP after revision and review, where to purchase TENS online if interested Person educated: Patient Education method: Medical illustrator Education comprehension: verbalized understanding, returned demonstration, and needs further education  HOME EXERCISE PROGRAM: Access Code: [email or web address] URL: [email or web address] Date: 07/07/2023 Prepared by: Nickola Baron  Exercises - Standing Hip Extension with Counter Support  - 1 x daily - 7 x weekly - 3 sets - 10 reps - Standing March with Counter Support  - 1 x daily - 7 x weekly - 3 sets - 10 reps - Heel Raises with Counter Support  - 1 x daily - 7 x weekly - 3 sets - 10 reps - Sit to Stand  - 1 x daily - 7 x weekly - 3 sets - 5 reps - Step Up  - 1 x daily - 7 x weekly - 3 sets - 10 reps - Towel Scrunches  - 1 x daily - 7 x weekly - 3 sets - 10 reps - Standing Terminal Knee Extension with Resistance  - 1 x daily - 7 x weekly - 3 sets - 10 reps - Forward Step Down with Heel Tap and Counter Support  - 1 x daily - 7 x weekly - 3 sets - 10 reps - Forward Step Up with Counter Support  - 1 x daily - 7 x weekly - 3 sets - 10 reps  Verbally added 4/30: -resisted tall kneel mini-squats with blue theraband 3 x 10 reps  - Clamshell  - 1 x daily - 7 x weekly - 3 sets - 10 reps - Side Stepping with Resistance at Ankles and Counter Support  - 1 x daily - 7 x weekly - 3 sets - 10 reps - Forward and Backward Monster Walk with Resistance at Ankles and Counter Support  - 1 x daily - 7 x weekly - 3 sets - 10 reps    GOALS: Goals reviewed with patient? Yes  SHORT TERM GOALS=LONG TERM GOALS due to length of POC   LONG TERM GOALS: Target date: 07/30/2023  Pt will be independent with final HEP for improved strength, balance, transfers, endurance and gait. Baseline: pt independent with current HEP but additions ongoing (5/5) Goal status: IN PROGRESS  2.  Pt will improve  5 x STS to less than or equal to 25 seconds to demonstrate improved functional strength and transfer efficiency.  Baseline: 29.81 sec no UE (4/7), 16.35 sec no UE (5/5) Goal status: MET  3.  Pt will improve gait velocity to at least 2.0 ft/sec for improved gait efficiency and performance at mod I level  Baseline: 1.74 ft/sec mod I (4/7), 2.64 ft/sec mod I (5/5) Goal status: MET  4.  Pt will improve FGA to 22/30 for decreased fall risk  Baseline: 17/30 (4/7), 22/30 (5/5) Goal status: MET  5.  Patient will ambulate >/=1054ft during to demonstrate improved endurance.  Baseline: 791ft, 908 ft (5/5) Goal status: IN PROGRESS  NEW SHORT TERM GOALS=LONG TERM GOALS due to length of POC   NEW LONG TERM GOALS:  Target date: 08/25/2023  Pt will be independent with final HEP for improved strength, balance, transfers, endurance and gait. Baseline: pt independent with current HEP but additions ongoing (5/5) Goal status:  IN PROGRESS  2.  Pt will improve 5 x STS to less than or equal to 15 seconds to demonstrate improved functional strength and transfer efficiency.  Baseline: 29.81 sec no UE (4/7), 16.35 sec no UE (5/5) Goal status: REVISED/UPGRADED  3.  Pt will improve gait velocity to at least 2.8 ft/sec for improved gait efficiency and performance at mod I level  Baseline: 1.74 ft/sec mod I (4/7), 2.64 ft/sec mod I (5/5) Goal status: REVISED/UPGRADED  4.  Pt will improve FGA to 26/30 for decreased fall risk  Baseline: 17/30 (4/7), 22/30 (5/5) Goal status: REVISED/UPGRADED  5.  Patient will ambulate >/=1062ft during to demonstrate improved endurance.  Baseline: 772ft, 908 ft (5/5) Goal status: IN PROGRESS     ASSESSMENT:  CLINICAL IMPRESSION: Emphasis of skilled PT session on discussing where patient can purchase a TENS unit online if interested or at a local medical supply store. Also finished review of her HEP, pt with good understanding and performance of her exercises. She  continues to benefit from skilled PT services for functional strengthening to continue to work towards return to her PLOF. Continue POC.    OBJECTIVE IMPAIRMENTS: Abnormal gait, cardiopulmonary status limiting activity, decreased activity tolerance, decreased balance, decreased coordination, decreased endurance, decreased knowledge of condition, decreased mobility, difficulty walking, decreased strength, impaired perceived functional ability, impaired sensation, and pain.   ACTIVITY LIMITATIONS: carrying, lifting, bending, sitting, squatting, stairs, transfers, and bed mobility  PARTICIPATION LIMITATIONS: meal prep, cleaning, laundry, occupation, and yard work  PERSONAL FACTORS: 1-2 comorbidities:   diabetes mellitus, hypertension, hyperlipidemia are also affecting patient's functional outcome.   REHAB POTENTIAL: Excellent  CLINICAL DECISION MAKING: Stable/uncomplicated  EVALUATION COMPLEXITY: Low  PLAN:  PT FREQUENCY: 2x/week x 2 weeks then 1x/week for 2 weeks (recert)  PT DURATION: 4 weeks + 4 weeks (recert)  PLANNED INTERVENTIONS: 69629- PT Re-evaluation, 97110-Therapeutic exercises, 97530- Therapeutic activity, 97112- Neuromuscular re-education, 97535- Self Care, 52841- Manual therapy, [phone]- Gait training, [phone]- Orthotic Fit/training, [phone]- Aquatic Therapy, [phone]- Electrical stimulation (manual), Patient/Family education, Balance training, Stair training, Taping, Dry Needling, Joint mobilization, DME instructions, Cryotherapy, and Moist heat  PLAN FOR NEXT SESSION: functional strengthening for BLE (sit to stands, step taps or step ups, lateral stepping, monster walks), work towards return to hiking and gardening, endurance, half kneel to tall kneel transition, quadruped, hiking outdoors, L knee strengthening, B hip strengthening, SLS, Bosu squats, SciFit level 3, can also continue to work on gardening tasks and getting up/down from the floor   Lorita Rosa, PT Lorita Rosa,  PT, DPT, CSRS   08/06/2023, 2:07 PM

## 2023-08-11 ENCOUNTER — Ambulatory Visit: Admitting: Physical Therapy

## 2023-08-11 DIAGNOSIS — R2681 Unsteadiness on feet: Secondary | ICD-10-CM

## 2023-08-11 DIAGNOSIS — M6281 Muscle weakness (generalized): Secondary | ICD-10-CM | POA: Diagnosis not present

## 2023-08-11 DIAGNOSIS — R2689 Other abnormalities of gait and mobility: Secondary | ICD-10-CM

## 2023-08-11 DIAGNOSIS — R278 Other lack of coordination: Secondary | ICD-10-CM

## 2023-08-11 NOTE — Therapy (Signed)
 OUTPATIENT PHYSICAL THERAPY NEURO TREATMENT   Patient Name: Becky Shaw MRN: [id number] DOB:1971-02-02, 53 y.o., female Today's Date: 08/11/2023   PCP: Claire Crick, MD REFERRING PROVIDER: Daryel Ensign, DO    END OF SESSION:   PT End of Session - 08/11/23 1101     Visit Number 12    Number of Visits 16   recert   Date for PT Re-Evaluation 09/08/23   recert, to allow for scheduling delays   Authorization Type [facility]    PT Start Time 1100    PT Stop Time 1140    PT Time Calculation (min) 40 min    Equipment Utilized During Treatment Gait belt    Activity Tolerance Patient tolerated treatment well    Behavior During Therapy [facility] for tasks assessed/performed                 Past Medical History:  Diagnosis Date   ALLERGIC RHINITIS 05/17/2007   GANGLION CYST, WRIST, RIGHT 05/31/2008   Hyperlipidemia    OVERACTIVE BLADDER 05/15/2007   Prediabetes    Past Surgical History:  Procedure Laterality Date   CHOLECYSTECTOMY     Patient Active Problem List   Diagnosis Date Noted   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   Neuropathy 04/19/2023   GBS (Guillain Barre syndrome) (HCC) 04/19/2023   Thyroid  nodule 04/17/2023   Postmenopausal 04/17/2023   Back pain 04/17/2023   History of stroke in adulthood 04/16/2023   Disturbance of skin sensation 04/16/2023   Elevated blood protein 04/16/2023   Paresthesias 04/14/2023   Travel advice encounter 09/16/2022   Menopausal state 09/16/2022   Borderline hypothyroidism 03/18/2022   Family history of stroke (cerebrovascular) 03/08/2022   Type 2 diabetes mellitus without complication, without long-term current use of insulin  (HCC) 12/07/2021   Dermatitis of Shaw ear canals 08/29/2021   Hyperlipidemia associated with type 2 diabetes mellitus (HCC) 06/26/2020   Hives 10/18/2015   Essential hypertension 03/22/2015   Aphthous ulcer of mouth 12/16/2011   Health maintenance examination 12/07/2010   Allergic rhinitis  05/17/2007    ONSET DATE: 06/23/2023 (referral date, symptom onset 04/11/2023)  REFERRING DIAG: G61.0 (ICD-10-CM) - GBS (Guillain Barre syndrome) (HCC) R26.81 (ICD-10-CM) - Unsteady gait M79.2 (ICD-10-CM) - Neuropathic pain  THERAPY DIAG:  Muscle weakness (generalized)  Unsteadiness on feet  Other abnormalities of gait and mobility  Other lack of coordination  Rationale for Evaluation and Treatment: Rehabilitation  SUBJECTIVE:  SUBJECTIVE STATEMENT: "[name]"  Pt went camping over the weekend, had a great time and had no issues with mobility at the campsite. She had no falls or other acute changes. Pt reports she did have to do a lot of walking to/from the bathroom so her groin area did hurt a little bit. She also reports that her L knee continues to bother her, she didn't have pain in her toes over the weekend. Pt reports that even standing for longer periods of time (30 min to an hour) she gets that pain in her groin area as well.  Pt accompanied by: self  PERTINENT HISTORY:  PMH: diabetes mellitus, hypertension, hyperlipidemia  Per Dr. Lydia Sams note 06/11/23: Guillain-Barre syndrome manifesting with progressive paresthesias, generalized weakness, and bulbar weakness (Jan 2024), supported by CSF findings (protein > 600) and MRI lumbar spine showing diffuse nerve root enhancement. She was treated with IVIG and made significant improvements. Exam shows mild weakness in the legs and distal sensory loss. Reflexes are present at the knees and trace at the ankles (improved). NCS/EMG was reviewed and is normal indicating that she has a very good prognosis for recovery, but that it can take months for complete resolution of symptoms.  PAIN:  Are you having pain? Yes: NPRS scale: 3/10 Nerve sensation in toes and  fingers Numbness in low back/sacrum  PRECAUTIONS: Fall  PATIENT GOALS: "be able to do stairs normally, get my balance, be able to squat or sit on the ground, go hiking, get back to my normal life"  OBJECTIVE:  Note: Objective measures were completed at Evaluation unless otherwise noted.  DIAGNOSTIC FINDINGS:  NCS/EMG of the left side 05/30/2023: This is a normal study of the left upper and lower extremities.  In particular, there is no evidence of a large fiber sensorimotor polyneuropathy or cervical/lumbosacral radiculopathy.   MRI lumbar spine 04/20/2023: 1. Diffuse thickening and enhancement of the nerve roots of the cauda equina. This is consistent with the clinical diagnosis of Guillain-Barre syndrome. The differential diagnosis includes an acute presentation of CIDP. 2. No significant disc protrusion or stenosis in the lumbar spine.   MRI cervical spine 04/15/2023: 1. Normal MRI appearance of the cervical spinal cord. 2. Mild noncompressive disc bulging at C2-3 through C6-7 without significant stenosis or overt neural impingement.   MRI brain wo contrast 04/15/2023: 1. No acute intracranial abnormality. 2. Subcentimeter remote lacunar infarct at the posterior aspect of the right lentiform nucleus. 3. Otherwise normal brain MRI.                                                                                                                              TREATMENT:   TherEx SciFit multi-peaks level 3.5 for 8 minutes using BUE/BLEs for neural priming for reciprocal movement, dynamic cardiovascular warmup and increased amplitude of stepping. RPE of 8/10 following activity.   NMR To work on LE strengthening and balance reactions in // bars: Static stance on Bosu 5  x 30 sec each no UE Mini-squats on rockerboard no UE support In A/P direction 2 x 10 reps In M/L direction 2 x 10 reps More difficulty in this direction Lateral sidestepping along blue foam beam 4 x 10 ft with no UE  support Added in alt L/R gumdrop taps with 3 x 10 ft L/R sidestepping    PATIENT EDUCATION: Education details: continue HEP Person educated: Patient Education method: Medical illustrator Education comprehension: verbalized understanding, returned demonstration, and needs further education  HOME EXERCISE PROGRAM: Access Code: [email or web address] URL: [email or web address] Date: 07/07/2023 Prepared by: Nickola Baron  Exercises - Standing Hip Extension with Counter Support  - 1 x daily - 7 x weekly - 3 sets - 10 reps - Standing March with Counter Support  - 1 x daily - 7 x weekly - 3 sets - 10 reps - Heel Raises with Counter Support  - 1 x daily - 7 x weekly - 3 sets - 10 reps - Sit to Stand  - 1 x daily - 7 x weekly - 3 sets - 5 reps - Step Up  - 1 x daily - 7 x weekly - 3 sets - 10 reps - Towel Scrunches  - 1 x daily - 7 x weekly - 3 sets - 10 reps - Standing Terminal Knee Extension with Resistance  - 1 x daily - 7 x weekly - 3 sets - 10 reps - Forward Step Down with Heel Tap and Counter Support  - 1 x daily - 7 x weekly - 3 sets - 10 reps - Forward Step Up with Counter Support  - 1 x daily - 7 x weekly - 3 sets - 10 reps  Verbally added 4/30: -resisted tall kneel mini-squats with blue theraband 3 x 10 reps  - Clamshell  - 1 x daily - 7 x weekly - 3 sets - 10 reps - Side Stepping with Resistance at Ankles and Counter Support  - 1 x daily - 7 x weekly - 3 sets - 10 reps - Forward and Backward Monster Walk with Resistance at Ankles and Counter Support  - 1 x daily - 7 x weekly - 3 sets - 10 reps    GOALS: Goals reviewed with patient? Yes  SHORT TERM GOALS=LONG TERM GOALS due to length of POC   LONG TERM GOALS: Target date: 07/30/2023  Pt will be independent with final HEP for improved strength, balance, transfers, endurance and gait. Baseline: pt independent with current HEP but additions ongoing (5/5) Goal status: IN PROGRESS  2.  Pt will improve 5 x STS to  less than or equal to 25 seconds to demonstrate improved functional strength and transfer efficiency.  Baseline: 29.81 sec no UE (4/7), 16.35 sec no UE (5/5) Goal status: MET  3.  Pt will improve gait velocity to at least 2.0 ft/sec for improved gait efficiency and performance at mod I level  Baseline: 1.74 ft/sec mod I (4/7), 2.64 ft/sec mod I (5/5) Goal status: MET  4.  Pt will improve FGA to 22/30 for decreased fall risk  Baseline: 17/30 (4/7), 22/30 (5/5) Goal status: MET  5.  Patient will ambulate >/=1062ft during to demonstrate improved endurance.  Baseline: 761ft, 908 ft (5/5) Goal status: IN PROGRESS  NEW SHORT TERM GOALS=LONG TERM GOALS due to length of POC   NEW LONG TERM GOALS:  Target date: 08/25/2023  Pt will be independent with final HEP for improved strength, balance, transfers, endurance and gait. Baseline: pt independent  with current HEP but additions ongoing (5/5) Goal status: IN PROGRESS  2.  Pt will improve 5 x STS to less than or equal to 15 seconds to demonstrate improved functional strength and transfer efficiency.  Baseline: 29.81 sec no UE (4/7), 16.35 sec no UE (5/5) Goal status: REVISED/UPGRADED  3.  Pt will improve gait velocity to at least 2.8 ft/sec for improved gait efficiency and performance at mod I level  Baseline: 1.74 ft/sec mod I (4/7), 2.64 ft/sec mod I (5/5) Goal status: REVISED/UPGRADED  4.  Pt will improve FGA to 26/30 for decreased fall risk  Baseline: 17/30 (4/7), 22/30 (5/5) Goal status: REVISED/UPGRADED  5.  Patient will ambulate >/=1035ft during to demonstrate improved endurance.  Baseline: 732ft, 908 ft (5/5) Goal status: IN PROGRESS     ASSESSMENT:  CLINICAL IMPRESSION: Emphasis of skilled PT session on continuing to work on balance reactions and LE strengthening. Pt with ongoing onset of L knee pain with deep squats and with soreness in her inner thigh muscles/groin around after lots of walking and standing over  the weekend. Plan to further assess L knee pain next visit. She continues to benefit from skilled PT services for functional strengthening to continue to work towards return to her PLOF. Continue POC.    OBJECTIVE IMPAIRMENTS: Abnormal gait, cardiopulmonary status limiting activity, decreased activity tolerance, decreased balance, decreased coordination, decreased endurance, decreased knowledge of condition, decreased mobility, difficulty walking, decreased strength, impaired perceived functional ability, impaired sensation, and pain.   ACTIVITY LIMITATIONS: carrying, lifting, bending, sitting, squatting, stairs, transfers, and bed mobility  PARTICIPATION LIMITATIONS: meal prep, cleaning, laundry, occupation, and yard work  PERSONAL FACTORS: 1-2 comorbidities:   diabetes mellitus, hypertension, hyperlipidemia are also affecting patient's functional outcome.   REHAB POTENTIAL: Excellent  CLINICAL DECISION MAKING: Stable/uncomplicated  EVALUATION COMPLEXITY: Low  PLAN:  PT FREQUENCY: 2x/week x 2 weeks then 1x/week for 2 weeks (recert)  PT DURATION: 4 weeks + 4 weeks (recert)  PLANNED INTERVENTIONS: 78469- PT Re-evaluation, 97110-Therapeutic exercises, 97530- Therapeutic activity, 97112- Neuromuscular re-education, 97535- Self Care, 62952- Manual therapy, [phone]- Gait training, [phone]- Orthotic Fit/training, [phone]- Aquatic Therapy, [phone]- Electrical stimulation (manual), Patient/Family education, Balance training, Stair training, Taping, Dry Needling, Joint mobilization, DME instructions, Cryotherapy, and Moist heat  PLAN FOR NEXT SESSION: functional strengthening for BLE (sit to stands, step taps or step ups, lateral stepping, monster walks), work towards return to hiking and gardening, endurance, half kneel to tall kneel transition, quadruped, hiking outdoors, L knee strengthening, B hip strengthening, SLS, Bosu squats, SciFit level 3.5 to 4, can also continue to work on gardening tasks and  getting up/down from the floor,  assess L knee pain more   Lorita Rosa, PT Lorita Rosa, PT, DPT, CSRS   08/11/2023, 11:43 AM

## 2023-08-13 ENCOUNTER — Ambulatory Visit: Admitting: Physical Therapy

## 2023-08-13 DIAGNOSIS — M6281 Muscle weakness (generalized): Secondary | ICD-10-CM | POA: Diagnosis not present

## 2023-08-13 DIAGNOSIS — R278 Other lack of coordination: Secondary | ICD-10-CM

## 2023-08-13 DIAGNOSIS — R2681 Unsteadiness on feet: Secondary | ICD-10-CM

## 2023-08-13 DIAGNOSIS — R2689 Other abnormalities of gait and mobility: Secondary | ICD-10-CM

## 2023-08-13 NOTE — Therapy (Signed)
 OUTPATIENT PHYSICAL THERAPY NEURO TREATMENT   Patient Name: Becky Shaw MRN: [id number] DOB:September 11, 1970, 53 y.o., female Today's Date: 08/13/2023   PCP: Claire Crick, MD REFERRING PROVIDER: Daryel Ensign, DO    END OF SESSION:   PT End of Session - 08/13/23 1146     Visit Number 13    Number of Visits 16   recert   Date for PT Re-Evaluation 09/08/23   recert, to allow for scheduling delays   Authorization Type [facility]    PT Start Time 1145    PT Stop Time 1225    PT Time Calculation (min) 40 min    Equipment Utilized During Treatment Gait belt    Activity Tolerance Patient tolerated treatment well    Behavior During Therapy [facility] for tasks assessed/performed                  Past Medical History:  Diagnosis Date   ALLERGIC RHINITIS 05/17/2007   GANGLION CYST, WRIST, RIGHT 05/31/2008   Hyperlipidemia    OVERACTIVE BLADDER 05/15/2007   Prediabetes    Past Surgical History:  Procedure Laterality Date   CHOLECYSTECTOMY     Patient Active Problem List   Diagnosis Date Noted   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   Neuropathy 04/19/2023   GBS (Guillain Barre syndrome) (HCC) 04/19/2023   Thyroid  nodule 04/17/2023   Postmenopausal 04/17/2023   Back pain 04/17/2023   History of stroke in adulthood 04/16/2023   Disturbance of skin sensation 04/16/2023   Elevated blood protein 04/16/2023   Paresthesias 04/14/2023   Travel advice encounter 09/16/2022   Menopausal state 09/16/2022   Borderline hypothyroidism 03/18/2022   Family history of stroke (cerebrovascular) 03/08/2022   Type 2 diabetes mellitus without complication, without long-term current use of insulin  (HCC) 12/07/2021   Dermatitis of both ear canals 08/29/2021   Hyperlipidemia associated with type 2 diabetes mellitus (HCC) 06/26/2020   Hives 10/18/2015   Essential hypertension 03/22/2015   Aphthous ulcer of mouth 12/16/2011   Health maintenance examination 12/07/2010   Allergic  rhinitis 05/17/2007    ONSET DATE: 06/23/2023 (referral date, symptom onset 04/11/2023)  REFERRING DIAG: G61.0 (ICD-10-CM) - GBS (Guillain Barre syndrome) (HCC) R26.81 (ICD-10-CM) - Unsteady gait M79.2 (ICD-10-CM) - Neuropathic pain  THERAPY DIAG:  Muscle weakness (generalized)  Unsteadiness on feet  Other abnormalities of gait and mobility  Other lack of coordination  Rationale for Evaluation and Treatment: Rehabilitation  SUBJECTIVE:  SUBJECTIVE STATEMENT: "[name]"  Pt went hiking yesterday at [facility], wanted to try one mile - didn't feel intense tightness or blood flowing in her hands like she would prior to GBS. Pt has noticed she still has some weakness and impaired endurance, was not able to do the full 3 mile hike. Pt need a nap after the hike. Pt reports that the trail had various inclines/declines which was a good challenge.  Pt still having some soreness in her groin muscles.  Pt accompanied by: self  PERTINENT HISTORY:  PMH: diabetes mellitus, hypertension, hyperlipidemia  Per Dr. Lydia Sams note 06/11/23: Guillain-Barre syndrome manifesting with progressive paresthesias, generalized weakness, and bulbar weakness (Jan 2024), supported by CSF findings (protein > 600) and MRI lumbar spine showing diffuse nerve root enhancement. She was treated with IVIG and made significant improvements. Exam shows mild weakness in the legs and distal sensory loss. Reflexes are present at the knees and trace at the ankles (improved). NCS/EMG was reviewed and is normal indicating that she has a very good prognosis for recovery, but that it can take months for complete resolution of symptoms.  PAIN:  Are you having pain? Yes: NPRS scale: 3/10 Nerve sensation in toes and fingers Numbness in low  back/sacrum  PRECAUTIONS: Fall  PATIENT GOALS: "be able to do stairs normally, get my balance, be able to squat or sit on the ground, go hiking, get back to my normal life"  OBJECTIVE:  Note: Objective measures were completed at Evaluation unless otherwise noted.  DIAGNOSTIC FINDINGS:  NCS/EMG of the left side 05/30/2023: This is a normal study of the left upper and lower extremities.  In particular, there is no evidence of a large fiber sensorimotor polyneuropathy or cervical/lumbosacral radiculopathy.   MRI lumbar spine 04/20/2023: 1. Diffuse thickening and enhancement of the nerve roots of the cauda equina. This is consistent with the clinical diagnosis of Guillain-Barre syndrome. The differential diagnosis includes an acute presentation of CIDP. 2. No significant disc protrusion or stenosis in the lumbar spine.   MRI cervical spine 04/15/2023: 1. Normal MRI appearance of the cervical spinal cord. 2. Mild noncompressive disc bulging at C2-3 through C6-7 without significant stenosis or overt neural impingement.   MRI brain wo contrast 04/15/2023: 1. No acute intracranial abnormality. 2. Subcentimeter remote lacunar infarct at the posterior aspect of the right lentiform nucleus. 3. Otherwise normal brain MRI.                                                                                                                              TREATMENT:   TherEx SciFit multi-peaks level 4.0 for 8 minutes using BUE/BLEs for neural priming for reciprocal movement, dynamic cardiovascular warmup and increased amplitude of stepping. RPE of 8/10 following activity.  To work on LLE strengthening: Attempted LLE SLR in supine, unable to perform due to quad weakness Supine SAQ x 10 reps with 5 sec hold Supine hip/knee flexion ix 10 reps  Added  appropriate exercises to HEP, see bolded below  TherAct To further assess ongoing L knee pain: L knee strength: 5/5 ext 5/5 flex ROM WFL Patellar mobility  WFL/hypermobility in M/L direction, no pain Pt only gets pain with deep squats, if descending stairs with RLE and have to bend LLE McMurray's test- little bit of pain in medial and lateral knee Educated patient on meniscus and goal of continuing to strengthen muscles around knee joint to support joint    PATIENT EDUCATION: Education details: continue HEP, added to HEP, see above regarding L knee pain Person educated: Patient Education method: Explanation, Demonstration, and Handouts Education comprehension: verbalized understanding, returned demonstration, and needs further education  HOME EXERCISE PROGRAM: Access Code: [email or web address] URL: [email or web address] Date: 07/07/2023 Prepared by: Nickola Baron  Exercises - Standing Hip Extension with Counter Support  - 1 x daily - 7 x weekly - 3 sets - 10 reps - Standing March with Counter Support  - 1 x daily - 7 x weekly - 3 sets - 10 reps - Heel Raises with Counter Support  - 1 x daily - 7 x weekly - 3 sets - 10 reps - Sit to Stand  - 1 x daily - 7 x weekly - 3 sets - 5 reps - Step Up  - 1 x daily - 7 x weekly - 3 sets - 10 reps - Towel Scrunches  - 1 x daily - 7 x weekly - 3 sets - 10 reps - Standing Terminal Knee Extension with Resistance  - 1 x daily - 7 x weekly - 3 sets - 10 reps - Forward Step Down with Heel Tap and Counter Support  - 1 x daily - 7 x weekly - 3 sets - 10 reps - Forward Step Up with Counter Support  - 1 x daily - 7 x weekly - 3 sets - 10 reps  Verbally added 4/30: -resisted tall kneel mini-squats with blue theraband 3 x 10 reps  - Clamshell  - 1 x daily - 7 x weekly - 3 sets - 10 reps - Side Stepping with Resistance at Ankles and Counter Support  - 1 x daily - 7 x weekly - 3 sets - 10 reps - Forward and Backward Monster Walk with Resistance at Ankles and Counter Support  - 1 x daily - 7 x weekly - 3 sets - 10 reps - Supine Knee Extension Strengthening  - 1 x daily - 7 x weekly - 3 sets - 10 reps - 5 sec  hold - Supine Knee to Chest with Leg Straight  - 1 x daily - 7 x weekly - 3 sets - 10 reps    GOALS: Goals reviewed with patient? Yes  SHORT TERM GOALS=LONG TERM GOALS due to length of POC   LONG TERM GOALS: Target date: 07/30/2023  Pt will be independent with final HEP for improved strength, balance, transfers, endurance and gait. Baseline: pt independent with current HEP but additions ongoing (5/5) Goal status: IN PROGRESS  2.  Pt will improve 5 x STS to less than or equal to 25 seconds to demonstrate improved functional strength and transfer efficiency.  Baseline: 29.81 sec no UE (4/7), 16.35 sec no UE (5/5) Goal status: MET  3.  Pt will improve gait velocity to at least 2.0 ft/sec for improved gait efficiency and performance at mod I level  Baseline: 1.74 ft/sec mod I (4/7), 2.64 ft/sec mod I (5/5) Goal status: MET  4.  Pt will improve FGA to 22/30  for decreased fall risk  Baseline: 17/30 (4/7), 22/30 (5/5) Goal status: MET  5.  Patient will ambulate >/=1028ft during to demonstrate improved endurance.  Baseline: 780ft, 908 ft (5/5) Goal status: IN PROGRESS  NEW SHORT TERM GOALS=LONG TERM GOALS due to length of POC   NEW LONG TERM GOALS:  Target date: 08/25/2023  Pt will be independent with final HEP for improved strength, balance, transfers, endurance and gait. Baseline: pt independent with current HEP but additions ongoing (5/5) Goal status: IN PROGRESS  2.  Pt will improve 5 x STS to less than or equal to 15 seconds to demonstrate improved functional strength and transfer efficiency.  Baseline: 29.81 sec no UE (4/7), 16.35 sec no UE (5/5) Goal status: REVISED/UPGRADED  3.  Pt will improve gait velocity to at least 2.8 ft/sec for improved gait efficiency and performance at mod I level  Baseline: 1.74 ft/sec mod I (4/7), 2.64 ft/sec mod I (5/5) Goal status: REVISED/UPGRADED  4.  Pt will improve FGA to 26/30 for decreased fall risk  Baseline: 17/30 (4/7), 22/30  (5/5) Goal status: REVISED/UPGRADED  5.  Patient will ambulate >/=1048ft during to demonstrate improved endurance.  Baseline: 756ft, 908 ft (5/5) Goal status: IN PROGRESS     ASSESSMENT:  CLINICAL IMPRESSION: Emphasis of skilled PT session on further assess L knee joint and potential causes of her pain. Pt noted to have some pain with McMurray's test, indicative of meniscus being cause of her pain. Continued to work on strengthening exercises in order to increase support around her L knee joint. Pt noted to have significant L quad weakness with inability to perform SLS in supine. She continues to benefit from skilled PT services for functional strengthening to continue to work towards return to her PLOF. Continue POC.    OBJECTIVE IMPAIRMENTS: Abnormal gait, cardiopulmonary status limiting activity, decreased activity tolerance, decreased balance, decreased coordination, decreased endurance, decreased knowledge of condition, decreased mobility, difficulty walking, decreased strength, impaired perceived functional ability, impaired sensation, and pain.   ACTIVITY LIMITATIONS: carrying, lifting, bending, sitting, squatting, stairs, transfers, and bed mobility  PARTICIPATION LIMITATIONS: meal prep, cleaning, laundry, occupation, and yard work  PERSONAL FACTORS: 1-2 comorbidities:   diabetes mellitus, hypertension, hyperlipidemia are also affecting patient's functional outcome.   REHAB POTENTIAL: Excellent  CLINICAL DECISION MAKING: Stable/uncomplicated  EVALUATION COMPLEXITY: Low  PLAN:  PT FREQUENCY: 2x/week x 2 weeks then 1x/week for 2 weeks (recert)  PT DURATION: 4 weeks + 4 weeks (recert)  PLANNED INTERVENTIONS: 25366- PT Re-evaluation, 97110-Therapeutic exercises, 97530- Therapeutic activity, 97112- Neuromuscular re-education, 97535- Self Care, 44034- Manual therapy, [phone]- Gait training, [phone]- Orthotic Fit/training, [phone]- Aquatic Therapy, [phone]- Electrical stimulation  (manual), Patient/Family education, Balance training, Stair training, Taping, Dry Needling, Joint mobilization, DME instructions, Cryotherapy, and Moist heat  PLAN FOR NEXT SESSION: functional strengthening for BLE (sit to stands, step taps or step ups, lateral stepping, monster walks), work towards return to hiking and gardening, endurance, half kneel to tall kneel transition, quadruped, hiking outdoors, L knee strengthening, B hip strengthening, SLS, Bosu squats, SciFit level 3.5 to 4, can also continue to work on gardening tasks and getting up/down from the floor, half kneel   Lorita Rosa, PT Lorita Rosa, PT, DPT, CSRS   08/13/2023, 12:26 PM

## 2023-08-19 ENCOUNTER — Ambulatory Visit: Admitting: Physical Therapy

## 2023-08-19 DIAGNOSIS — R2681 Unsteadiness on feet: Secondary | ICD-10-CM

## 2023-08-19 DIAGNOSIS — R2689 Other abnormalities of gait and mobility: Secondary | ICD-10-CM

## 2023-08-19 DIAGNOSIS — M6281 Muscle weakness (generalized): Secondary | ICD-10-CM | POA: Diagnosis not present

## 2023-08-19 DIAGNOSIS — R278 Other lack of coordination: Secondary | ICD-10-CM

## 2023-08-19 NOTE — Therapy (Signed)
 OUTPATIENT PHYSICAL THERAPY NEURO TREATMENT   Patient Name: Becky Shaw MRN: [id number] DOB:[birth year], 53 y.o., female Today's Date: 08/19/2023   PCP: Claire Crick, MD REFERRING PROVIDER: Daryel Ensign, DO    END OF SESSION:   PT End of Session - 08/19/23 0802     Visit Number 14    Number of Visits 16   recert   Date for PT Re-Evaluation 09/08/23   recert, to allow for scheduling delays   Authorization Type [facility]    PT Start Time 0800    PT Stop Time 0845    PT Time Calculation (min) 45 min    Equipment Utilized During Treatment Gait belt    Activity Tolerance Patient tolerated treatment well    Behavior During Therapy [facility] for tasks assessed/performed                   Past Medical History:  Diagnosis Date   ALLERGIC RHINITIS 05/17/2007   GANGLION CYST, WRIST, RIGHT 05/31/2008   Hyperlipidemia    OVERACTIVE BLADDER 05/15/2007   Prediabetes    Past Surgical History:  Procedure Laterality Date   CHOLECYSTECTOMY     Patient Active Problem List   Diagnosis Date Noted   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   Neuropathy 04/19/2023   GBS (Guillain Barre syndrome) (HCC) 04/19/2023   Thyroid  nodule 04/17/2023   Postmenopausal 04/17/2023   Back pain 04/17/2023   History of stroke in adulthood 04/16/2023   Disturbance of skin sensation 04/16/2023   Elevated blood protein 04/16/2023   Paresthesias 04/14/2023   Travel advice encounter 09/16/2022   Menopausal state 09/16/2022   Borderline hypothyroidism 03/18/2022   Family history of stroke (cerebrovascular) 03/08/2022   Type 2 diabetes mellitus without complication, without long-term current use of insulin  (HCC) 12/07/2021   Dermatitis of both ear canals 08/29/2021   Hyperlipidemia associated with type 2 diabetes mellitus (HCC) 06/26/2020   Hives 10/18/2015   Essential hypertension 03/22/2015   Aphthous ulcer of mouth 12/16/2011   Health maintenance examination 12/07/2010   Allergic  rhinitis 05/17/2007    ONSET DATE: 06/23/2023 (referral date, symptom onset 04/11/2023)  REFERRING DIAG: G61.0 (ICD-10-CM) - GBS (Guillain Barre syndrome) (HCC) R26.81 (ICD-10-CM) - Unsteady gait M79.2 (ICD-10-CM) - Neuropathic pain  THERAPY DIAG:  Muscle weakness (generalized)  Unsteadiness on feet  Other abnormalities of gait and mobility  Other lack of coordination  Rationale for Evaluation and Treatment: Rehabilitation  SUBJECTIVE:  SUBJECTIVE STATEMENT: "[name]"  Pt had a near fall over the weekend, she was walking down the stairs carrying a bowl of vegetables and stepped down with her RLE first, was able to catch herself on the screen door. Pt reports no injuries with the fall, believes her L knee gave out on her on the stairs.  Pt with ongoing L knee pain (5/10 today), going to mention it to Dr. Lydia Sams on 7/1, possibly get imaging?  Pt did feel dizzy, nauseous on Monday with the rainy weather.   Pt accompanied by: self  PERTINENT HISTORY:  PMH: diabetes mellitus, hypertension, hyperlipidemia  Per Dr. Lydia Sams note 06/11/23: Guillain-Barre syndrome manifesting with progressive paresthesias, generalized weakness, and bulbar weakness (Jan 2024), supported by CSF findings (protein > 600) and MRI lumbar spine showing diffuse nerve root enhancement. She was treated with IVIG and made significant improvements. Exam shows mild weakness in the legs and distal sensory loss. Reflexes are present at the knees and trace at the ankles (improved). NCS/EMG was reviewed and is normal indicating that she has a very good prognosis for recovery, but that it can take months for complete resolution of symptoms.  PAIN:  Are you having pain? Yes: NPRS scale: 3/10 Nerve sensation in toes and fingers Numbness in low  back/sacrum   Yes: NPRS scale: 5/10 L knee Gets worse with deep squats, going down stairs, getting up/down from the floor  PRECAUTIONS: Fall  PATIENT GOALS: "be able to do stairs normally, get my balance, be able to squat or sit on the ground, go hiking, get back to my normal life"  OBJECTIVE:  Note: Objective measures were completed at Evaluation unless otherwise noted.  DIAGNOSTIC FINDINGS:  NCS/EMG of the left side 05/30/2023: This is a normal study of the left upper and lower extremities.  In particular, there is no evidence of a large fiber sensorimotor polyneuropathy or cervical/lumbosacral radiculopathy.   MRI lumbar spine 04/20/2023: 1. Diffuse thickening and enhancement of the nerve roots of the cauda equina. This is consistent with the clinical diagnosis of Guillain-Barre syndrome. The differential diagnosis includes an acute presentation of CIDP. 2. No significant disc protrusion or stenosis in the lumbar spine.   MRI cervical spine 04/15/2023: 1. Normal MRI appearance of the cervical spinal cord. 2. Mild noncompressive disc bulging at C2-3 through C6-7 without significant stenosis or overt neural impingement.   MRI brain wo contrast 04/15/2023: 1. No acute intracranial abnormality. 2. Subcentimeter remote lacunar infarct at the posterior aspect of the right lentiform nucleus. 3. Otherwise normal brain MRI.                                                                                                                              TREATMENT:   TherEx SciFit multi-peaks level 4.0 for 8 minutes using BUE/BLEs for neural priming for reciprocal movement, dynamic cardiovascular warmup and increased amplitude of stepping. RPE of 7/10 following activity.  To work on LLE strengthening: Leg  press with BLE: 50# 3 x 10 reps In half-kneel on red mat on floor: L/R 6# weighted ball chest press 2 x 10 reps B Added airex under knees for second set due to L knee pain No change in L  knee pain with addition of airex Seated LLE HS curls X 10 reps with red TB 2 x 10 reps with green TB Added to HEP, see bolded below  TherAct To assess B knee edema: R knee circumference: 15.25" L knee circumference: 15.5"    PATIENT EDUCATION: Education details: continue HEP, added to HEP, encouraged her to reach out to PCP about ongoing L knee pain Person educated: Patient Education method: Explanation, Demonstration, and Handouts Education comprehension: verbalized understanding, returned demonstration, and needs further education  HOME EXERCISE PROGRAM: Access Code: [email or web address] URL: [email or web address] Date: 07/07/2023 Prepared by: Nickola Baron  Exercises - Standing Hip Extension with Counter Support  - 1 x daily - 7 x weekly - 3 sets - 10 reps - Standing March with Counter Support  - 1 x daily - 7 x weekly - 3 sets - 10 reps - Heel Raises with Counter Support  - 1 x daily - 7 x weekly - 3 sets - 10 reps - Sit to Stand  - 1 x daily - 7 x weekly - 3 sets - 5 reps - Step Up  - 1 x daily - 7 x weekly - 3 sets - 10 reps - Towel Scrunches  - 1 x daily - 7 x weekly - 3 sets - 10 reps - Standing Terminal Knee Extension with Resistance  - 1 x daily - 7 x weekly - 3 sets - 10 reps - Forward Step Down with Heel Tap and Counter Support  - 1 x daily - 7 x weekly - 3 sets - 10 reps - Forward Step Up with Counter Support  - 1 x daily - 7 x weekly - 3 sets - 10 reps  Verbally added 4/30: -resisted tall kneel mini-squats with blue theraband 3 x 10 reps  - Clamshell  - 1 x daily - 7 x weekly - 3 sets - 10 reps - Side Stepping with Resistance at Ankles and Counter Support  - 1 x daily - 7 x weekly - 3 sets - 10 reps - Forward and Backward Monster Walk with Resistance at Ankles and Counter Support  - 1 x daily - 7 x weekly - 3 sets - 10 reps - Supine Knee Extension Strengthening  - 1 x daily - 7 x weekly - 3 sets - 10 reps - 5 sec hold - Supine Knee to Chest with Leg  Straight  - 1 x daily - 7 x weekly - 3 sets - 10 reps - Seated Hamstring Curl with Anchored Resistance  - 1 x daily - 7 x weekly - 3 sets - 10 reps    GOALS: Goals reviewed with patient? Yes  SHORT TERM GOALS=LONG TERM GOALS due to length of POC   LONG TERM GOALS: Target date: 07/30/2023  Pt will be independent with final HEP for improved strength, balance, transfers, endurance and gait. Baseline: pt independent with current HEP but additions ongoing (5/5) Goal status: IN PROGRESS  2.  Pt will improve 5 x STS to less than or equal to 25 seconds to demonstrate improved functional strength and transfer efficiency.  Baseline: 29.81 sec no UE (4/7), 16.35 sec no UE (5/5) Goal status: MET  3.  Pt will improve gait velocity to at least  2.0 ft/sec for improved gait efficiency and performance at mod I level  Baseline: 1.74 ft/sec mod I (4/7), 2.64 ft/sec mod I (5/5) Goal status: MET  4.  Pt will improve FGA to 22/30 for decreased fall risk  Baseline: 17/30 (4/7), 22/30 (5/5) Goal status: MET  5.  Patient will ambulate >/=1035ft during to demonstrate improved endurance.  Baseline: 743ft, 908 ft (5/5) Goal status: IN PROGRESS  NEW SHORT TERM GOALS=LONG TERM GOALS due to length of POC   NEW LONG TERM GOALS:  Target date: 08/25/2023  Pt will be independent with final HEP for improved strength, balance, transfers, endurance and gait. Baseline: pt independent with current HEP but additions ongoing (5/5) Goal status: IN PROGRESS  2.  Pt will improve 5 x STS to less than or equal to 15 seconds to demonstrate improved functional strength and transfer efficiency.  Baseline: 29.81 sec no UE (4/7), 16.35 sec no UE (5/5) Goal status: REVISED/UPGRADED  3.  Pt will improve gait velocity to at least 2.8 ft/sec for improved gait efficiency and performance at mod I level  Baseline: 1.74 ft/sec mod I (4/7), 2.64 ft/sec mod I (5/5) Goal status: REVISED/UPGRADED  4.  Pt will improve FGA to  26/30 for decreased fall risk  Baseline: 17/30 (4/7), 22/30 (5/5) Goal status: REVISED/UPGRADED  5.  Patient will ambulate >/=1061ft during to demonstrate improved endurance.  Baseline: 774ft, 908 ft (5/5) Goal status: IN PROGRESS     ASSESSMENT:  CLINICAL IMPRESSION: Emphasis of skilled PT session on continuing to work on BLE strengthening in efforts to address ongoing L knee pain. Encouraged pt to reach out to her PCP to see them earlier than end of July to try and get L knee imaging and possible referral to an orthopedic specialist due to ongoing pain that has not improved so far. She continues to have L knee pain with squats, when descending stairs, and in WBing positions. She continues to benefit from skilled PT services for functional strengthening to continue to work towards return to her PLOF. Continue POC.    OBJECTIVE IMPAIRMENTS: Abnormal gait, cardiopulmonary status limiting activity, decreased activity tolerance, decreased balance, decreased coordination, decreased endurance, decreased knowledge of condition, decreased mobility, difficulty walking, decreased strength, impaired perceived functional ability, impaired sensation, and pain.   ACTIVITY LIMITATIONS: carrying, lifting, bending, sitting, squatting, stairs, transfers, and bed mobility  PARTICIPATION LIMITATIONS: meal prep, cleaning, laundry, occupation, and yard work  PERSONAL FACTORS: 1-2 comorbidities:   diabetes mellitus, hypertension, hyperlipidemia are also affecting patient's functional outcome.   REHAB POTENTIAL: Excellent  CLINICAL DECISION MAKING: Stable/uncomplicated  EVALUATION COMPLEXITY: Low  PLAN:  PT FREQUENCY: 2x/week x 2 weeks then 1x/week for 2 weeks (recert)  PT DURATION: 4 weeks + 4 weeks (recert)  PLANNED INTERVENTIONS: 47829- PT Re-evaluation, 97110-Therapeutic exercises, 97530- Therapeutic activity, 97112- Neuromuscular re-education, 97535- Self Care, 56213- Manual therapy, [phone]-  Gait training, [phone]- Orthotic Fit/training, [phone]- Aquatic Therapy, [phone]- Electrical stimulation (manual), Patient/Family education, Balance training, Stair training, Taping, Dry Needling, Joint mobilization, DME instructions, Cryotherapy, and Moist heat  PLAN FOR NEXT SESSION: functional strengthening for BLE (sit to stands, step taps or step ups, lateral stepping, monster walks), work towards return to hiking and gardening, endurance, half kneel to tall kneel transition, quadruped, hiking outdoors, L knee strengthening, B hip strengthening, SLS, Bosu squats, SciFit level 3.5 to 4, can also continue to work on gardening tasks and getting up/down from the floor, how is L knee pain?   Azan Maneri, PT  Lorita Rosa, PT, DPT, CSRS   08/19/2023, 8:46 AM

## 2023-08-21 ENCOUNTER — Ambulatory Visit: Admitting: Physical Therapy

## 2023-08-21 DIAGNOSIS — M6281 Muscle weakness (generalized): Secondary | ICD-10-CM | POA: Diagnosis not present

## 2023-08-21 DIAGNOSIS — R2689 Other abnormalities of gait and mobility: Secondary | ICD-10-CM

## 2023-08-21 DIAGNOSIS — R278 Other lack of coordination: Secondary | ICD-10-CM

## 2023-08-21 DIAGNOSIS — R2681 Unsteadiness on feet: Secondary | ICD-10-CM

## 2023-08-21 NOTE — Therapy (Signed)
 OUTPATIENT PHYSICAL THERAPY NEURO TREATMENT   Patient Name: Becky Shaw MRN: [id number] DOB:[birth year], 53 y.o., female Today's Date: 08/21/2023   PCP: Claire Crick, MD REFERRING PROVIDER: Daryel Ensign, DO    END OF SESSION:   PT End of Session - 08/21/23 1104     Visit Number 15    Number of Visits 16   recert   Date for PT Re-Evaluation 09/08/23   recert, to allow for scheduling delays   Authorization Type [facility]    PT Start Time 1100    PT Stop Time 1145    PT Time Calculation (min) 45 min    Equipment Utilized During Treatment Gait belt    Activity Tolerance Patient tolerated treatment well    Behavior During Therapy [facility] for tasks assessed/performed                    Past Medical History:  Diagnosis Date   ALLERGIC RHINITIS 05/17/2007   GANGLION CYST, WRIST, RIGHT 05/31/2008   Hyperlipidemia    OVERACTIVE BLADDER 05/15/2007   Prediabetes    Past Surgical History:  Procedure Laterality Date   CHOLECYSTECTOMY     Patient Active Problem List   Diagnosis Date Noted   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   Neuropathy 04/19/2023   GBS (Guillain Barre syndrome) (HCC) 04/19/2023   Thyroid  nodule 04/17/2023   Postmenopausal 04/17/2023   Back pain 04/17/2023   History of stroke in adulthood 04/16/2023   Disturbance of skin sensation 04/16/2023   Elevated blood protein 04/16/2023   Paresthesias 04/14/2023   Travel advice encounter 09/16/2022   Menopausal state 09/16/2022   Borderline hypothyroidism 03/18/2022   Family history of stroke (cerebrovascular) 03/08/2022   Type 2 diabetes mellitus without complication, without long-term current use of insulin  (HCC) 12/07/2021   Dermatitis of both ear canals 08/29/2021   Hyperlipidemia associated with type 2 diabetes mellitus (HCC) 06/26/2020   Hives 10/18/2015   Essential hypertension 03/22/2015   Aphthous ulcer of mouth 12/16/2011   Health maintenance examination 12/07/2010   Allergic  rhinitis 05/17/2007    ONSET DATE: 06/23/2023 (referral date, symptom onset 04/11/2023)  REFERRING DIAG: G61.0 (ICD-10-CM) - GBS (Guillain Barre syndrome) (HCC) R26.81 (ICD-10-CM) - Unsteady gait M79.2 (ICD-10-CM) - Neuropathic pain  THERAPY DIAG:  Muscle weakness (generalized)  Unsteadiness on feet  Other abnormalities of gait and mobility  Other lack of coordination  Rationale for Evaluation and Treatment: Rehabilitation  SUBJECTIVE:  SUBJECTIVE STATEMENT: "Becky Shaw"  Pt reports that her back is about the same, L knee pain about the same, still getting electric shock sensation. Pt denies any falls or acute changes.  Pt continues to get sharp pain in her tailbone region when sitting for more than a few minutes, gets pain in her groin area if she tries to stand for too long.  Pt accompanied by: self  PERTINENT HISTORY:  PMH: diabetes mellitus, hypertension, hyperlipidemia  Per Dr. Lydia Sams note 06/11/23: Guillain-Barre syndrome manifesting with progressive paresthesias, generalized weakness, and bulbar weakness (Jan 2024), supported by CSF findings (protein > 600) and MRI lumbar spine showing diffuse nerve root enhancement. She was treated with IVIG and made significant improvements. Exam shows mild weakness in the legs and distal sensory loss. Reflexes are present at the knees and trace at the ankles (improved). NCS/EMG was reviewed and is normal indicating that she has a very good prognosis for recovery, but that it can take months for complete resolution of symptoms.  PAIN:  Are you having pain? Yes: NPRS scale: 3/10 Nerve sensation in toes and fingers Numbness in low back/sacrum   Yes: NPRS scale: 5/10 L knee Gets worse with deep squats, going down stairs, getting up/down from the  floor  PRECAUTIONS: Fall  PATIENT GOALS: "be able to do stairs normally, get my balance, be able to squat or sit on the ground, go hiking, get back to my normal life"  OBJECTIVE:  Note: Objective measures were completed at Evaluation unless otherwise noted.  DIAGNOSTIC FINDINGS:  NCS/EMG of the left side 05/30/2023: This is a normal study of the left upper and lower extremities.  In particular, there is no evidence of a large fiber sensorimotor polyneuropathy or cervical/lumbosacral radiculopathy.   MRI lumbar spine 04/20/2023: 1. Diffuse thickening and enhancement of the nerve roots of the cauda equina. This is consistent with the clinical diagnosis of Guillain-Barre syndrome. The differential diagnosis includes an acute presentation of CIDP. 2. No significant disc protrusion or stenosis in the lumbar spine.   MRI cervical spine 04/15/2023: 1. Normal MRI appearance of the cervical spinal cord. 2. Mild noncompressive disc bulging at C2-3 through C6-7 without significant stenosis or overt neural impingement.   MRI brain wo contrast 04/15/2023: 1. No acute intracranial abnormality. 2. Subcentimeter remote lacunar infarct at the posterior aspect of the right lentiform nucleus. 3. Otherwise normal brain MRI.                                                                                                                              TREATMENT:   TherEx SciFit multi-peaks level 4.5 for 8 minutes using BUE/BLEs for neural priming for reciprocal movement, dynamic cardiovascular warmup and increased amplitude of stepping. RPE of 6/10 following activity.  To work on core and hip strengthening and stability: Supine bridges x 10 reps Supine PPT with TA contract x 10 reps with 5 sec hold Supine bridge with PPT x 10 reps  with 5 sec hold Supine march with PPT x 10 reps with 5 sec hold  Added to HEP, see bolded below   TherAct To assess ongoing tailbone pain: Pt noted to have decreased spinal  mobility in R lumbar region as compared to L lumbar region Pt with R groin pain with hip IR, L groin pain with IR R groin pain and L groin pain with hip compression  Manual Therapy While patient in prone: PA glides of lumbar facet joints on R side as well as SI/pelvic distraction While patient in supine: R hip distraction and L hip distraction- feels stretch in hip as well as in her low back    PATIENT EDUCATION: Education details: continue HEP, added to HEP, encouraged her to reach out to PCP about ongoing L knee pain Person educated: Patient Education method: Explanation, Demonstration, and Handouts Education comprehension: verbalized understanding, returned demonstration, and needs further education  HOME EXERCISE PROGRAM: Access Code: [email or web address] URL: [email or web address] Date: 07/07/2023 Prepared by: Nickola Baron  Exercises - Standing Hip Extension with Counter Support  - 1 x daily - 7 x weekly - 3 sets - 10 reps - Standing March with Counter Support  - 1 x daily - 7 x weekly - 3 sets - 10 reps - Heel Raises with Counter Support  - 1 x daily - 7 x weekly - 3 sets - 10 reps - Sit to Stand  - 1 x daily - 7 x weekly - 3 sets - 5 reps - Step Up  - 1 x daily - 7 x weekly - 3 sets - 10 reps - Towel Scrunches  - 1 x daily - 7 x weekly - 3 sets - 10 reps - Standing Terminal Knee Extension with Resistance  - 1 x daily - 7 x weekly - 3 sets - 10 reps - Forward Step Down with Heel Tap and Counter Support  - 1 x daily - 7 x weekly - 3 sets - 10 reps - Forward Step Up with Counter Support  - 1 x daily - 7 x weekly - 3 sets - 10 reps  Verbally added 4/30: -resisted tall kneel mini-squats with blue theraband 3 x 10 reps  - Clamshell  - 1 x daily - 7 x weekly - 3 sets - 10 reps - Side Stepping with Resistance at Ankles and Counter Support  - 1 x daily - 7 x weekly - 3 sets - 10 reps - Forward and Backward Monster Walk with Resistance at Ankles and Counter Support  - 1 x  daily - 7 x weekly - 3 sets - 10 reps - Supine Knee Extension Strengthening  - 1 x daily - 7 x weekly - 3 sets - 10 reps - 5 sec hold - Supine Knee to Chest with Leg Straight  - 1 x daily - 7 x weekly - 3 sets - 10 reps - Seated Hamstring Curl with Anchored Resistance  - 1 x daily - 7 x weekly - 3 sets - 10 reps - Supine Posterior Pelvic Tilt  - 1 x daily - 7 x weekly - 1 sets - 1 reps - 5 sec hold - Supine Bridge  - 1 x daily - 7 x weekly - 3 sets - 10 reps - 5 sec hold - Supine March with Posterior Pelvic Tilt  - 1 x daily - 7 x weekly - 3 sets - 10 reps - 5 sec hold    GOALS: Goals reviewed with patient? Yes  SHORT TERM GOALS=LONG TERM GOALS due to length of POC   LONG TERM GOALS: Target date: 07/30/2023  Pt will be independent with final HEP for improved strength, balance, transfers, endurance and gait. Baseline: pt independent with current HEP but additions ongoing (5/5) Goal status: IN PROGRESS  2.  Pt will improve 5 x STS to less than or equal to 25 seconds to demonstrate improved functional strength and transfer efficiency.  Baseline: 29.81 sec no UE (4/7), 16.35 sec no UE (5/5) Goal status: MET  3.  Pt will improve gait velocity to at least 2.0 ft/sec for improved gait efficiency and performance at mod I level  Baseline: 1.74 ft/sec mod I (4/7), 2.64 ft/sec mod I (5/5) Goal status: MET  4.  Pt will improve FGA to 22/30 for decreased fall risk  Baseline: 17/30 (4/7), 22/30 (5/5) Goal status: MET  5.  Patient will ambulate >/=1061ft during to demonstrate improved endurance.  Baseline: 757ft, 908 ft (5/5) Goal status: IN PROGRESS  NEW SHORT TERM GOALS=LONG TERM GOALS due to length of POC   NEW LONG TERM GOALS:  Target date: 08/25/2023  Pt will be independent with final HEP for improved strength, balance, transfers, endurance and gait. Baseline: pt independent with current HEP but additions ongoing (5/5) Goal status: IN PROGRESS  2.  Pt will improve 5 x STS to  less than or equal to 15 seconds to demonstrate improved functional strength and transfer efficiency.  Baseline: 29.81 sec no UE (4/7), 16.35 sec no UE (5/5) Goal status: REVISED/UPGRADED  3.  Pt will improve gait velocity to at least 2.8 ft/sec for improved gait efficiency and performance at mod I level  Baseline: 1.74 ft/sec mod I (4/7), 2.64 ft/sec mod I (5/5) Goal status: REVISED/UPGRADED  4.  Pt will improve FGA to 26/30 for decreased fall risk  Baseline: 17/30 (4/7), 22/30 (5/5) Goal status: REVISED/UPGRADED  5.  Patient will ambulate >/=1013ft during to demonstrate improved endurance.  Baseline: 784ft, 908 ft (5/5) Goal status: IN PROGRESS     ASSESSMENT:  CLINICAL IMPRESSION: Emphasis of skilled PT session on assessing lumbar and SI joint ROM due to ongoing pain in tailbone region. Pt noted to have decreased mobility in her R lumbar spine region, performed manual therapy to address decreased mobility and ongoing pain. Pt also with onset of groin pain with B hip compression, relieved with hip distraction. Added core strengthening exercises to her HEP to continue to work on stabilization in this area as well. She continues to benefit from skilled PT services for functional strengthening to continue to work towards return to her PLOF. Continue POC.    OBJECTIVE IMPAIRMENTS: Abnormal gait, cardiopulmonary status limiting activity, decreased activity tolerance, decreased balance, decreased coordination, decreased endurance, decreased knowledge of condition, decreased mobility, difficulty walking, decreased strength, impaired perceived functional ability, impaired sensation, and pain.   ACTIVITY LIMITATIONS: carrying, lifting, bending, sitting, squatting, stairs, transfers, and bed mobility  PARTICIPATION LIMITATIONS: meal prep, cleaning, laundry, occupation, and yard work  PERSONAL FACTORS: 1-2 comorbidities:   diabetes mellitus, hypertension, hyperlipidemia are also affecting  patient's functional outcome.   REHAB POTENTIAL: Excellent  CLINICAL DECISION MAKING: Stable/uncomplicated  EVALUATION COMPLEXITY: Low  PLAN:  PT FREQUENCY: 2x/week x 2 weeks then 1x/week for 2 weeks (recert)  PT DURATION: 4 weeks + 4 weeks (recert)  PLANNED INTERVENTIONS: 32202- PT Re-evaluation, 97110-Therapeutic exercises, 97530- Therapeutic activity, [id number]- Neuromuscular re-education, 97535- Self Care, [id number]- Manual therapy, [id number]- Gait training, [phone]- Orthotic Fit/training, [id number]- Aquatic Therapy, [phone]-  Electrical stimulation (manual), Patient/Family education, Balance training, Stair training, Taping, Dry Needling, Joint mobilization, DME instructions, Cryotherapy, and Moist heat  PLAN FOR NEXT SESSION: functional strengthening for BLE (sit to stands, step taps or step ups, lateral stepping, monster walks), work towards return to hiking and gardening, endurance, half kneel to tall kneel transition, quadruped, hiking outdoors, L knee strengthening, B hip strengthening, SLS, Bosu squats, SciFit level 3.5 to 4, can also continue to work on gardening tasks and getting up/down from the floor, how is L knee pain?, hip distraction, recert to cover last scheduled visit and discuss PT POC   Lorita Rosa, PT Lorita Rosa, PT, DPT, CSRS   08/21/2023, 11:46 AM

## 2023-08-25 ENCOUNTER — Ambulatory Visit: Attending: Neurology | Admitting: Physical Therapy

## 2023-08-25 DIAGNOSIS — R2689 Other abnormalities of gait and mobility: Secondary | ICD-10-CM | POA: Insufficient documentation

## 2023-08-25 DIAGNOSIS — R2681 Unsteadiness on feet: Secondary | ICD-10-CM | POA: Diagnosis present

## 2023-08-25 DIAGNOSIS — M6281 Muscle weakness (generalized): Secondary | ICD-10-CM | POA: Insufficient documentation

## 2023-08-25 DIAGNOSIS — R278 Other lack of coordination: Secondary | ICD-10-CM | POA: Insufficient documentation

## 2023-08-25 NOTE — Therapy (Signed)
 OUTPATIENT PHYSICAL THERAPY NEURO TREATMENT - RECERT   Patient Name: Becky Shaw MRN: [id number] DOB:[birth year], 53 y.o., female Today's Date: 08/26/2023   PCP: Claire Crick, MD REFERRING PROVIDER: Daryel Ensign, DO    END OF SESSION:   PT End of Session - 08/25/23 1106     Visit Number 16    Number of Visits 20   recert   Date for PT Re-Evaluation 09/29/23   recert, to allow for scheduling delays   Authorization Type [facility]    PT Start Time 1104    PT Stop Time 1142    PT Time Calculation (min) 38 min    Equipment Utilized During Treatment Gait belt    Activity Tolerance Patient tolerated treatment well    Behavior During Therapy [facility] for tasks assessed/performed                     Past Medical History:  Diagnosis Date   ALLERGIC RHINITIS 05/17/2007   GANGLION CYST, WRIST, RIGHT 05/31/2008   Hyperlipidemia    OVERACTIVE BLADDER 05/15/2007   Prediabetes    Past Surgical History:  Procedure Laterality Date   CHOLECYSTECTOMY     Patient Active Problem List   Diagnosis Date Noted   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   Neuropathy 04/19/2023   GBS (Guillain Barre syndrome) (HCC) 04/19/2023   Thyroid  nodule 04/17/2023   Postmenopausal 04/17/2023   Back pain 04/17/2023   History of stroke in adulthood 04/16/2023   Disturbance of skin sensation 04/16/2023   Elevated blood protein 04/16/2023   Paresthesias 04/14/2023   Travel advice encounter 09/16/2022   Menopausal state 09/16/2022   Borderline hypothyroidism 03/18/2022   Family history of stroke (cerebrovascular) 03/08/2022   Type 2 diabetes mellitus without complication, without long-term current use of insulin  (HCC) 12/07/2021   Dermatitis of both ear canals 08/29/2021   Hyperlipidemia associated with type 2 diabetes mellitus (HCC) 06/26/2020   Hives 10/18/2015   Essential hypertension 03/22/2015   Aphthous ulcer of mouth 12/16/2011   Health maintenance examination 12/07/2010    Allergic rhinitis 05/17/2007    ONSET DATE: 06/23/2023 (referral date, symptom onset 04/11/2023)  REFERRING DIAG: G61.0 (ICD-10-CM) - GBS (Guillain Barre syndrome) (HCC) R26.81 (ICD-10-CM) - Unsteady gait M79.2 (ICD-10-CM) - Neuropathic pain  THERAPY DIAG:  Muscle weakness (generalized)  Unsteadiness on feet  Other abnormalities of gait and mobility  Other lack of coordination  Rationale for Evaluation and Treatment: Rehabilitation  SUBJECTIVE:  SUBJECTIVE STATEMENT: "[name]"  Pt felt more tired and sore over the weekend, didn't feel like doing much. Pt not sure why she is feeling that way, did about an hour of yard work. Pt reports that today she continues to feel tired, no energy. Her back and tailbone pain has gotten worse at well.  Pt has been working on her core exercises - could be why she has more core/back pain, also doing LTRs. Her L knee is doing about the same, feels the pain with deep squats and going up/down stairs, when standing up.  Pt with no questions over her HEP.  Pt accompanied by: self  PERTINENT HISTORY:  PMH: diabetes mellitus, hypertension, hyperlipidemia  Per Dr. Lydia Sams note 06/11/23: Guillain-Barre syndrome manifesting with progressive paresthesias, generalized weakness, and bulbar weakness (Jan 2024), supported by CSF findings (protein > 600) and MRI lumbar spine showing diffuse nerve root enhancement. She was treated with IVIG and made significant improvements. Exam shows mild weakness in the legs and distal sensory loss. Reflexes are present at the knees and trace at the ankles (improved). NCS/EMG was reviewed and is normal indicating that she has a very good prognosis for recovery, but that it can take months for complete resolution of symptoms.  PAIN:  Are you having  pain? Yes: NPRS scale: 5/10 Nerve sensation in toes and fingers Numbness in low back/sacrum   Yes: NPRS scale: 5/10 L knee Gets worse with deep squats, going down stairs, getting up/down from the floor  PRECAUTIONS: Fall  PATIENT GOALS: "be able to do stairs normally, get my balance, be able to squat or sit on the ground, go hiking, get back to my normal life"  OBJECTIVE:  Note: Objective measures were completed at Evaluation unless otherwise noted.  DIAGNOSTIC FINDINGS:  NCS/EMG of the left side 05/30/2023: This is a normal study of the left upper and lower extremities.  In particular, there is no evidence of a large fiber sensorimotor polyneuropathy or cervical/lumbosacral radiculopathy.   MRI lumbar spine 04/20/2023: 1. Diffuse thickening and enhancement of the nerve roots of the cauda equina. This is consistent with the clinical diagnosis of Guillain-Barre syndrome. The differential diagnosis includes an acute presentation of CIDP. 2. No significant disc protrusion or stenosis in the lumbar spine.   MRI cervical spine 04/15/2023: 1. Normal MRI appearance of the cervical spinal cord. 2. Mild noncompressive disc bulging at C2-3 through C6-7 without significant stenosis or overt neural impingement.   MRI brain wo contrast 04/15/2023: 1. No acute intracranial abnormality. 2. Subcentimeter remote lacunar infarct at the posterior aspect of the right lentiform nucleus. 3. Otherwise normal brain MRI.                                                                                                                              TREATMENT:   Physical Performance For LTG assessment:  [facility] PT Assessment - 08/25/23 1111       Ambulation/Gait   Gait  velocity 32.8 ft over 10.9 = 3.0 ft/sec      6 minute walk test results    Aerobic Endurance Distance Walked 1267    Endurance additional comments 6/10 RPE      Standardized Balance Assessment   Standardized Balance Assessment Timed Up and  Go Test;Five Times Sit to Stand    Five times sit to stand comments  25.5 sec   no UE     Functional Gait  Assessment   Gait assessed  Yes    Gait Level Surface Walks 20 ft in less than 7 sec but greater than 5.5 sec, uses assistive device, slower speed, mild gait deviations, or deviates 6-10 in outside of the 12 in walkway width.    Change in Gait Speed Makes only minor adjustments to walking speed, or accomplishes a change in speed with significant gait deviations, deviates 10-15 in outside the 12 in walkway width, or changes speed but loses balance but is able to recover and continue walking.    Gait with Horizontal Head Turns Performs head turns smoothly with no change in gait. Deviates no more than 6 in outside 12 in walkway width    Gait with Vertical Head Turns Performs head turns with no change in gait. Deviates no more than 6 in outside 12 in walkway width.    Gait and Pivot Turn Pivot turns safely in greater than 3 sec and stops with no loss of balance, or pivot turns safely within 3 sec and stops with mild imbalance, requires small steps to catch balance.    Step Over Obstacle Is able to step over one shoe box (4.5 in total height) without changing gait speed. No evidence of imbalance.    Gait with Narrow Base of Support Is able to ambulate for 10 steps heel to toe with no staggering.    Gait with Eyes Closed Walks 20 ft, no assistive devices, good speed, no evidence of imbalance, normal gait pattern, deviates no more than 6 in outside 12 in walkway width. Ambulates 20 ft in less than 7 sec.    Ambulating Backwards Walks 20 ft, no assistive devices, good speed, no evidence for imbalance, normal gait    Steps Two feet to a stair, must use rail.    Total Score 23    FGA comment: 23/30             Discussed PT POC and plan to recert for another 4 visits to continue to address back pain, L knee pain, and well as functional mobility impairments.   PATIENT EDUCATION: Education  details: continue HEP, results of OM and functional implications, PT POC with plan to add visits/recert Person educated: Patient Education method: Explanation and Demonstration Education comprehension: verbalized understanding, returned demonstration, and needs further education  HOME EXERCISE PROGRAM: Access Code: [email or web address] URL: [email or web address] Date: 07/07/2023 Prepared by: Nickola Baron  Exercises - Standing Hip Extension with Counter Support  - 1 x daily - 7 x weekly - 3 sets - 10 reps - Standing March with Counter Support  - 1 x daily - 7 x weekly - 3 sets - 10 reps - Heel Raises with Counter Support  - 1 x daily - 7 x weekly - 3 sets - 10 reps - Sit to Stand  - 1 x daily - 7 x weekly - 3 sets - 5 reps - Step Up  - 1 x daily - 7 x weekly - 3 sets - 10 reps - Towel Scrunches  -  1 x daily - 7 x weekly - 3 sets - 10 reps - Standing Terminal Knee Extension with Resistance  - 1 x daily - 7 x weekly - 3 sets - 10 reps - Forward Step Down with Heel Tap and Counter Support  - 1 x daily - 7 x weekly - 3 sets - 10 reps - Forward Step Up with Counter Support  - 1 x daily - 7 x weekly - 3 sets - 10 reps  Verbally added 4/30: -resisted tall kneel mini-squats with blue theraband 3 x 10 reps  - Clamshell  - 1 x daily - 7 x weekly - 3 sets - 10 reps - Side Stepping with Resistance at Ankles and Counter Support  - 1 x daily - 7 x weekly - 3 sets - 10 reps - Forward and Backward Monster Walk with Resistance at Ankles and Counter Support  - 1 x daily - 7 x weekly - 3 sets - 10 reps - Supine Knee Extension Strengthening  - 1 x daily - 7 x weekly - 3 sets - 10 reps - 5 sec hold - Supine Knee to Chest with Leg Straight  - 1 x daily - 7 x weekly - 3 sets - 10 reps - Seated Hamstring Curl with Anchored Resistance  - 1 x daily - 7 x weekly - 3 sets - 10 reps - Supine Posterior Pelvic Tilt  - 1 x daily - 7 x weekly - 1 sets - 1 reps - 5 sec hold - Supine Bridge  - 1 x daily - 7 x weekly  - 3 sets - 10 reps - 5 sec hold - Supine March with Posterior Pelvic Tilt  - 1 x daily - 7 x weekly - 3 sets - 10 reps - 5 sec hold    GOALS: Goals reviewed with patient? Yes  SHORT TERM GOALS=LONG TERM GOALS due to length of POC   LONG TERM GOALS: Target date: 07/30/2023  Pt will be independent with final HEP for improved strength, balance, transfers, endurance and gait. Baseline: pt independent with current HEP but additions ongoing (5/5) Goal status: IN PROGRESS  2.  Pt will improve 5 x STS to less than or equal to 25 seconds to demonstrate improved functional strength and transfer efficiency.  Baseline: 29.81 sec no UE (4/7), 16.35 sec no UE (5/5) Goal status: MET  3.  Pt will improve gait velocity to at least 2.0 ft/sec for improved gait efficiency and performance at mod I level  Baseline: 1.74 ft/sec mod I (4/7), 2.64 ft/sec mod I (5/5) Goal status: MET  4.  Pt will improve FGA to 22/30 for decreased fall risk  Baseline: 17/30 (4/7), 22/30 (5/5) Goal status: MET  5.  Patient will ambulate >/=1074ft during to demonstrate improved endurance.  Baseline: 783ft, 908 ft (5/5) Goal status: IN PROGRESS  NEW SHORT TERM GOALS=LONG TERM GOALS due to length of POC   NEW LONG TERM GOALS:  Target date: 08/25/2023  Pt will be independent with final HEP for improved strength, balance, transfers, endurance and gait. Baseline: pt independent with current HEP but additions ongoing (5/5) Goal status: MET  2.  Pt will improve 5 x STS to less than or equal to 15 seconds to demonstrate improved functional strength and transfer efficiency.  Baseline: 29.81 sec no UE (4/7), 16.35 sec no UE (5/5), 23.3 sec no UE (6/2) Goal status: NOT MET - knee pain  3.  Pt will improve gait velocity to  at least 2.8 ft/sec for improved gait efficiency and performance at mod I level  Baseline: 1.74 ft/sec mod I (4/7), 2.64 ft/sec mod I (5/5), 3.0 ft/sec mod I (6/2) Goal status: MET  4.  Pt will improve  FGA to 26/30 for decreased fall risk  Baseline: 17/30 (4/7), 22/30 (5/5), 23/30 (6/2) Goal status: IN PROGRESS  5.  Patient will ambulate >/=1084ft during to demonstrate improved endurance.  Baseline: 767ft, 908 ft (5/5), 1267 ft RPE 6/10 (6/2) Goal status: MET   NEW NEW SHORT TERM GOALS=LONG TERM GOALS due to length of POC   NEW NEW LONG TERM GOALS:  Target date: 09/24/2023   Pt will improve 5 x STS to less than or equal to 15 seconds to demonstrate improved functional strength and transfer efficiency.  Baseline: 29.81 sec no UE (4/7), 16.35 sec no UE (5/5), 23.3 sec no UE (6/2) Goal status: IN PROGRESS  2.  Pt will improve gait velocity to at least 3.25 ft/sec for improved gait efficiency and performance at mod I level  Baseline: 1.74 ft/sec mod I (4/7), 2.64 ft/sec mod I (5/5), 3.0 ft/sec mod I (6/2) Goal status: INITIAL  3.  Pt will improve FGA to 26/30 for decreased fall risk  Baseline: 17/30 (4/7), 22/30 (5/5), 23/30 (6/2) Goal status: IN PROGRESS     ASSESSMENT:  CLINICAL IMPRESSION: Emphasis of skilled PT session on assessing LTG and creating new LTG for recertification of PT services. Pt has met 3/5 LTG due to being independent with her HEP, increasing her gait speed, and exhibiting improved endurance. She did improve her score on the FGA though she did not quite meet her goal level. She did not meet her 5xSTS score as she scored worse on this due to ongoing L knee pain. She continues to be limited by her L knee pain, tailbone pain, and sensory impairments with ongoing impaired balance. She continues to benefit from skilled PT services for functional strengthening to continue to work towards return to her PLOF. Continue POC.    OBJECTIVE IMPAIRMENTS: Abnormal gait, cardiopulmonary status limiting activity, decreased activity tolerance, decreased balance, decreased coordination, decreased endurance, decreased knowledge of condition, decreased mobility, difficulty  walking, decreased strength, impaired perceived functional ability, impaired sensation, and pain.   ACTIVITY LIMITATIONS: carrying, lifting, bending, sitting, squatting, stairs, transfers, and bed mobility  PARTICIPATION LIMITATIONS: meal prep, cleaning, laundry, occupation, and yard work  PERSONAL FACTORS: 1-2 comorbidities:   diabetes mellitus, hypertension, hyperlipidemia are also affecting patient's functional outcome.   REHAB POTENTIAL: Excellent  CLINICAL DECISION MAKING: Stable/uncomplicated  EVALUATION COMPLEXITY: Low  PLAN:  PT FREQUENCY: 2x/week x 2 weeks then 1x/week for 2 weeks (recert) + 1x/week (recert)  PT DURATION: 4 weeks + 4 weeks (recert) + 4 weeks (recert)  PLANNED INTERVENTIONS: 40981- PT Re-evaluation, 97110-Therapeutic exercises, 97530- Therapeutic activity, 97112- Neuromuscular re-education, 97535- Self Care, [id number]- Manual therapy, [phone]- Gait training, [phone]- Orthotic Fit/training, [phone]- Aquatic Therapy, [phone]- Electrical stimulation (manual), Patient/Family education, Balance training, Stair training, Taping, Dry Needling, Joint mobilization, DME instructions, Cryotherapy, and Moist heat  PLAN FOR NEXT SESSION: functional strengthening for BLE (sit to stands, step taps or step ups, lateral stepping, monster walks), work towards return to hiking and gardening, endurance, half kneel to tall kneel transition, quadruped, hiking outdoors, L knee strengthening, B hip strengthening, SLS, Bosu squats, SciFit level 3.5 to 4, can also continue to work on gardening tasks and getting up/down from the floor, how is L knee pain and tailbone pain?, hip distraction, treadmill -  increase speed and add in incline   Lorita Rosa, PT Lorita Rosa, PT, DPT, CSRS   08/26/2023, 12:55 PM

## 2023-09-01 ENCOUNTER — Ambulatory Visit: Admitting: Physical Therapy

## 2023-09-01 DIAGNOSIS — M6281 Muscle weakness (generalized): Secondary | ICD-10-CM | POA: Diagnosis not present

## 2023-09-01 DIAGNOSIS — R278 Other lack of coordination: Secondary | ICD-10-CM

## 2023-09-01 DIAGNOSIS — R2681 Unsteadiness on feet: Secondary | ICD-10-CM

## 2023-09-01 DIAGNOSIS — R2689 Other abnormalities of gait and mobility: Secondary | ICD-10-CM

## 2023-09-01 NOTE — Therapy (Signed)
 OUTPATIENT PHYSICAL THERAPY NEURO TREATMENT   Patient Name: Becky Shaw MRN: [id number] DOB:1970-07-20, 53 y.o., female Today's Date: 09/01/2023   PCP: Claire Crick, MD REFERRING PROVIDER: Daryel Ensign, DO    END OF SESSION:   PT End of Session - 09/01/23 1148     Visit Number 17    Number of Visits 20   recert   Date for PT Re-Evaluation 09/29/23   recert, to allow for scheduling delays   Authorization Type [facility]    PT Start Time 1143    PT Stop Time 1227    PT Time Calculation (min) 44 min    Equipment Utilized During Treatment Gait belt    Activity Tolerance Patient tolerated treatment well    Behavior During Therapy [facility] for tasks assessed/performed                      Past Medical History:  Diagnosis Date   ALLERGIC RHINITIS 05/17/2007   GANGLION CYST, WRIST, RIGHT 05/31/2008   Hyperlipidemia    OVERACTIVE BLADDER 05/15/2007   Prediabetes    Past Surgical History:  Procedure Laterality Date   CHOLECYSTECTOMY     Patient Active Problem List   Diagnosis Date Noted   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   Neuropathy 04/19/2023   GBS (Guillain Barre syndrome) (HCC) 04/19/2023   Thyroid  nodule 04/17/2023   Postmenopausal 04/17/2023   Back pain 04/17/2023   History of stroke in adulthood 04/16/2023   Disturbance of skin sensation 04/16/2023   Elevated blood protein 04/16/2023   Paresthesias 04/14/2023   Travel advice encounter 09/16/2022   Menopausal state 09/16/2022   Borderline hypothyroidism 03/18/2022   Family history of stroke (cerebrovascular) 03/08/2022   Type 2 diabetes mellitus without complication, without long-term current use of insulin  (HCC) 12/07/2021   Dermatitis of both ear canals 08/29/2021   Hyperlipidemia associated with type 2 diabetes mellitus (HCC) 06/26/2020   Hives 10/18/2015   Essential hypertension 03/22/2015   Aphthous ulcer of mouth 12/16/2011   Health maintenance examination 12/07/2010   Allergic  rhinitis 05/17/2007    ONSET DATE: 06/23/2023 (referral date, symptom onset 04/11/2023)  REFERRING DIAG: G61.0 (ICD-10-CM) - GBS (Guillain Barre syndrome) (HCC) R26.81 (ICD-10-CM) - Unsteady gait M79.2 (ICD-10-CM) - Neuropathic pain  THERAPY DIAG:  Muscle weakness (generalized)  Unsteadiness on feet  Other abnormalities of gait and mobility  Other lack of coordination  Rationale for Evaluation and Treatment: Rehabilitation  SUBJECTIVE:  SUBJECTIVE STATEMENT: "[name]"  Pt and her church made egg rolls over the weekend, she was able to direct others in making them. She reports that her energy levels have improved since last visit.  She does report that it is hard for her to sit with her legs bent on the floor without having L groin/inner thigh pain and back pain. She does not have this same pain when she bends her R leg.  Pt accompanied by: self  PERTINENT HISTORY:  PMH: diabetes mellitus, hypertension, hyperlipidemia  Per Dr. Lydia Sams note 06/11/23: Guillain-Barre syndrome manifesting with progressive paresthesias, generalized weakness, and bulbar weakness (Jan 2024), supported by CSF findings (protein > 600) and MRI lumbar spine showing diffuse nerve root enhancement. She was treated with IVIG and made significant improvements. Exam shows mild weakness in the legs and distal sensory loss. Reflexes are present at the knees and trace at the ankles (improved). NCS/EMG was reviewed and is normal indicating that she has a very good prognosis for recovery, but that it can take months for complete resolution of symptoms.  PAIN:  Are you having pain? Yes: NPRS scale: 5/10 Nerve sensation in toes and fingers Numbness in low back/sacrum   Yes: NPRS scale: 5/10 L knee Gets worse with deep squats, going down  stairs, getting up/down from the floor  PRECAUTIONS: Fall  PATIENT GOALS: "be able to do stairs normally, get my balance, be able to squat or sit on the ground, go hiking, get back to my normal life"  OBJECTIVE:  Note: Objective measures were completed at Evaluation unless otherwise noted.  DIAGNOSTIC FINDINGS:  NCS/EMG of the left side 05/30/2023: This is a normal study of the left upper and lower extremities.  In particular, there is no evidence of a large fiber sensorimotor polyneuropathy or cervical/lumbosacral radiculopathy.   MRI lumbar spine 04/20/2023: 1. Diffuse thickening and enhancement of the nerve roots of the cauda equina. This is consistent with the clinical diagnosis of Guillain-Barre syndrome. The differential diagnosis includes an acute presentation of CIDP. 2. No significant disc protrusion or stenosis in the lumbar spine.   MRI cervical spine 04/15/2023: 1. Normal MRI appearance of the cervical spinal cord. 2. Mild noncompressive disc bulging at C2-3 through C6-7 without significant stenosis or overt neural impingement.   MRI brain wo contrast 04/15/2023: 1. No acute intracranial abnormality. 2. Subcentimeter remote lacunar infarct at the posterior aspect of the right lentiform nucleus. 3. Otherwise normal brain MRI.                                                                                                                              TREATMENT:   TherEx To address L hip adductor tightness: Supine hip adductor stretch 3 x 30 sec  Gait Gait on treadmill at 1.0 mph x 10 min with gradual increase to incline of 3%. RPE 6/10 following gait on treadmill.  NMR To work on functional hip strengthening while wearing B 4# ankle  weights: Lateral sidestepping over 4" hurdles 3 x 10 ft each direction Forwards gait over 4" hurdles 6 x 10 ft Alt L/R cone taps with gait 6 x 10 ft   PATIENT EDUCATION: Education details: continue HEP Person educated: Patient Education  method: Medical illustrator Education comprehension: verbalized understanding, returned demonstration, and needs further education  HOME EXERCISE PROGRAM: Access Code: [email or web address] URL: [email or web address] Date: 07/07/2023 Prepared by: Nickola Baron  Exercises - Standing Hip Extension with Counter Support  - 1 x daily - 7 x weekly - 3 sets - 10 reps - Standing March with Counter Support  - 1 x daily - 7 x weekly - 3 sets - 10 reps - Heel Raises with Counter Support  - 1 x daily - 7 x weekly - 3 sets - 10 reps - Sit to Stand  - 1 x daily - 7 x weekly - 3 sets - 5 reps - Step Up  - 1 x daily - 7 x weekly - 3 sets - 10 reps - Towel Scrunches  - 1 x daily - 7 x weekly - 3 sets - 10 reps - Standing Terminal Knee Extension with Resistance  - 1 x daily - 7 x weekly - 3 sets - 10 reps - Forward Step Down with Heel Tap and Counter Support  - 1 x daily - 7 x weekly - 3 sets - 10 reps - Forward Step Up with Counter Support  - 1 x daily - 7 x weekly - 3 sets - 10 reps  Verbally added 4/30: -resisted tall kneel mini-squats with blue theraband 3 x 10 reps  - Clamshell  - 1 x daily - 7 x weekly - 3 sets - 10 reps - Side Stepping with Resistance at Ankles and Counter Support  - 1 x daily - 7 x weekly - 3 sets - 10 reps - Forward and Backward Monster Walk with Resistance at Ankles and Counter Support  - 1 x daily - 7 x weekly - 3 sets - 10 reps - Supine Knee Extension Strengthening  - 1 x daily - 7 x weekly - 3 sets - 10 reps - 5 sec hold - Supine Knee to Chest with Leg Straight  - 1 x daily - 7 x weekly - 3 sets - 10 reps - Seated Hamstring Curl with Anchored Resistance  - 1 x daily - 7 x weekly - 3 sets - 10 reps - Supine Posterior Pelvic Tilt  - 1 x daily - 7 x weekly - 1 sets - 1 reps - 5 sec hold - Supine Bridge  - 1 x daily - 7 x weekly - 3 sets - 10 reps - 5 sec hold - Supine March with Posterior Pelvic Tilt  - 1 x daily - 7 x weekly - 3 sets - 10 reps - 5 sec  hold    GOALS: Goals reviewed with patient? Yes  SHORT TERM GOALS=LONG TERM GOALS due to length of POC   LONG TERM GOALS: Target date: 07/30/2023  Pt will be independent with final HEP for improved strength, balance, transfers, endurance and gait. Baseline: pt independent with current HEP but additions ongoing (5/5) Goal status: IN PROGRESS  2.  Pt will improve 5 x STS to less than or equal to 25 seconds to demonstrate improved functional strength and transfer efficiency.  Baseline: 29.81 sec no UE (4/7), 16.35 sec no UE (5/5) Goal status: MET  3.  Pt will improve gait velocity to at least  2.0 ft/sec for improved gait efficiency and performance at mod I level  Baseline: 1.74 ft/sec mod I (4/7), 2.64 ft/sec mod I (5/5) Goal status: MET  4.  Pt will improve FGA to 22/30 for decreased fall risk  Baseline: 17/30 (4/7), 22/30 (5/5) Goal status: MET  5.  Patient will ambulate >/=1074ft during to demonstrate improved endurance.  Baseline: 768ft, 908 ft (5/5) Goal status: IN PROGRESS  NEW SHORT TERM GOALS=LONG TERM GOALS due to length of POC   NEW LONG TERM GOALS:  Target date: 08/25/2023  Pt will be independent with final HEP for improved strength, balance, transfers, endurance and gait. Baseline: pt independent with current HEP but additions ongoing (5/5) Goal status: MET  2.  Pt will improve 5 x STS to less than or equal to 15 seconds to demonstrate improved functional strength and transfer efficiency.  Baseline: 29.81 sec no UE (4/7), 16.35 sec no UE (5/5), 23.3 sec no UE (6/2) Goal status: NOT MET - knee pain  3.  Pt will improve gait velocity to at least 2.8 ft/sec for improved gait efficiency and performance at mod I level  Baseline: 1.74 ft/sec mod I (4/7), 2.64 ft/sec mod I (5/5), 3.0 ft/sec mod I (6/2) Goal status: MET  4.  Pt will improve FGA to 26/30 for decreased fall risk  Baseline: 17/30 (4/7), 22/30 (5/5), 23/30 (6/2) Goal status: IN PROGRESS  5.  Patient  will ambulate >/=1079ft during to demonstrate improved endurance.  Baseline: 789ft, 908 ft (5/5), 1267 ft RPE 6/10 (6/2) Goal status: MET   NEW NEW SHORT TERM GOALS=LONG TERM GOALS due to length of POC   NEW NEW LONG TERM GOALS:  Target date: 09/24/2023   Pt will improve 5 x STS to less than or equal to 15 seconds to demonstrate improved functional strength and transfer efficiency.  Baseline: 29.81 sec no UE (4/7), 16.35 sec no UE (5/5), 23.3 sec no UE (6/2) Goal status: IN PROGRESS  2.  Pt will improve gait velocity to at least 3.25 ft/sec for improved gait efficiency and performance at mod I level  Baseline: 1.74 ft/sec mod I (4/7), 2.64 ft/sec mod I (5/5), 3.0 ft/sec mod I (6/2) Goal status: INITIAL  3.  Pt will improve FGA to 26/30 for decreased fall risk  Baseline: 17/30 (4/7), 22/30 (5/5), 23/30 (6/2) Goal status: IN PROGRESS     ASSESSMENT:  CLINICAL IMPRESSION: Emphasis of skilled PT session on addressing tightness in L hip adductor muscles with stretching, initiating gait training on treadmill for maintenance of gait speed and addition of incline for increased challenge, and working on functional hip strengthening. Pt continues to exhibit decreased LLE strength as compared to her RLE. She continues to benefit from skilled PT services for functional strengthening to continue to work towards return to her PLOF. Continue POC.    OBJECTIVE IMPAIRMENTS: Abnormal gait, cardiopulmonary status limiting activity, decreased activity tolerance, decreased balance, decreased coordination, decreased endurance, decreased knowledge of condition, decreased mobility, difficulty walking, decreased strength, impaired perceived functional ability, impaired sensation, and pain.   ACTIVITY LIMITATIONS: carrying, lifting, bending, sitting, squatting, stairs, transfers, and bed mobility  PARTICIPATION LIMITATIONS: meal prep, cleaning, laundry, occupation, and yard work  PERSONAL FACTORS:  1-2 comorbidities:   diabetes mellitus, hypertension, hyperlipidemia are also affecting patient's functional outcome.   REHAB POTENTIAL: Excellent  CLINICAL DECISION MAKING: Stable/uncomplicated  EVALUATION COMPLEXITY: Low  PLAN:  PT FREQUENCY: 2x/week x 2 weeks then 1x/week for 2 weeks (recert) + 1x/week (recert)  PT DURATION: 4 weeks + 4 weeks (recert) + 4 weeks (recert)  PLANNED INTERVENTIONS: 08657- PT Re-evaluation, 97110-Therapeutic exercises, 97530- Therapeutic activity, 97112- Neuromuscular re-education, [phone]- Self Care, 29528- Manual therapy, [phone]- Gait training, [phone]- Orthotic Fit/training, [phone]- Aquatic Therapy, [phone]- Electrical stimulation (manual), Patient/Family education, Balance training, Stair training, Taping, Dry Needling, Joint mobilization, DME instructions, Cryotherapy, and Moist heat  PLAN FOR NEXT SESSION: functional strengthening for BLE (sit to stands, step taps or step ups, lateral stepping, monster walks), work towards return to hiking and gardening, endurance, half kneel to tall kneel transition, quadruped, hiking outdoors, L knee strengthening, B hip strengthening, SLS, Bosu squats, SciFit level 3.5 to 4, can also continue to work on gardening tasks and getting up/down from the floor, how is L knee pain and tailbone pain?, hip distraction, treadmill - increase speed and add in incline   Lorita Rosa, PT Lorita Rosa, PT, DPT, CSRS   09/01/2023, 12:28 PM

## 2023-09-08 ENCOUNTER — Ambulatory Visit: Payer: Self-pay | Admitting: Physical Therapy

## 2023-09-08 ENCOUNTER — Other Ambulatory Visit: Payer: 59

## 2023-09-08 DIAGNOSIS — R278 Other lack of coordination: Secondary | ICD-10-CM

## 2023-09-08 DIAGNOSIS — R2689 Other abnormalities of gait and mobility: Secondary | ICD-10-CM

## 2023-09-08 DIAGNOSIS — M6281 Muscle weakness (generalized): Secondary | ICD-10-CM | POA: Diagnosis not present

## 2023-09-08 DIAGNOSIS — R2681 Unsteadiness on feet: Secondary | ICD-10-CM

## 2023-09-08 NOTE — Therapy (Signed)
 OUTPATIENT PHYSICAL THERAPY NEURO TREATMENT   Patient Name: Becky Shaw MRN: [id number] DOB:1970-11-10, 53 y.o., female Today's Date: 09/08/2023   PCP: Claire Crick, MD REFERRING PROVIDER: Daryel Ensign, DO    END OF SESSION:   PT End of Session - 09/08/23 0932     Visit Number 18    Number of Visits 20   recert   Date for PT Re-Evaluation 09/29/23   recert, to allow for scheduling delays   Authorization Type [facility]    PT Start Time 0930    PT Stop Time 1011    PT Time Calculation (min) 41 min    Equipment Utilized During Treatment Gait belt    Activity Tolerance Patient tolerated treatment well    Behavior During Therapy [facility] for tasks assessed/performed                    Past Medical History:  Diagnosis Date   ALLERGIC RHINITIS 05/17/2007   GANGLION CYST, WRIST, RIGHT 05/31/2008   Hyperlipidemia    OVERACTIVE BLADDER 05/15/2007   Prediabetes    Past Surgical History:  Procedure Laterality Date   CHOLECYSTECTOMY     Patient Active Problem List   Diagnosis Date Noted   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   Neuropathy 04/19/2023   GBS (Guillain Barre syndrome) (HCC) 04/19/2023   Thyroid  nodule 04/17/2023   Postmenopausal 04/17/2023   Back pain 04/17/2023   History of stroke in adulthood 04/16/2023   Disturbance of skin sensation 04/16/2023   Elevated blood protein 04/16/2023   Paresthesias 04/14/2023   Travel advice encounter 09/16/2022   Menopausal state 09/16/2022   Borderline hypothyroidism 03/18/2022   Family history of stroke (cerebrovascular) 03/08/2022   Type 2 diabetes mellitus without complication, without long-term current use of insulin  (HCC) 12/07/2021   Dermatitis of both ear canals 08/29/2021   Hyperlipidemia associated with type 2 diabetes mellitus (HCC) 06/26/2020   Hives 10/18/2015   Essential hypertension 03/22/2015   Aphthous ulcer of mouth 12/16/2011   Health maintenance examination 12/07/2010   Allergic  rhinitis 05/17/2007    ONSET DATE: 06/23/2023 (referral date, symptom onset 04/11/2023)  REFERRING DIAG: G61.0 (ICD-10-CM) - GBS (Guillain Barre syndrome) (HCC) R26.81 (ICD-10-CM) - Unsteady gait M79.2 (ICD-10-CM) - Neuropathic pain  THERAPY DIAG:  Muscle weakness (generalized)  Unsteadiness on feet  Other abnormalities of gait and mobility  Other lack of coordination  Rationale for Evaluation and Treatment: Rehabilitation  SUBJECTIVE:  SUBJECTIVE STATEMENT: Becky Shaw  Pt reports she is feeling about the same, has not tried anything new yet due to the increased heat/humidity and all of the rain. She feels like she has less tolerance for the heat/humidity since GBS. She also feels like she is getting the electric shock sensation more often now that weather is getting hotter. She is having trouble sleeping due to these sensations as well as getting hot/sweaty.  She does feel like the hip stretch from last visit is helping but she continues to have more tightness in her L hip as compared to her R hip.  Pt sees Dr. Lydia Sams on 7/1.   Pt accompanied by: self  PERTINENT HISTORY:  PMH: diabetes mellitus, hypertension, hyperlipidemia  Per Dr. Lydia Sams note 06/11/23: Guillain-Barre syndrome manifesting with progressive paresthesias, generalized weakness, and bulbar weakness (Jan 2024), supported by CSF findings (protein > 600) and MRI lumbar spine showing diffuse nerve root enhancement. She was treated with IVIG and made significant improvements. Exam shows mild weakness in the legs and distal sensory loss. Reflexes are present at the knees and trace at the ankles (improved). NCS/EMG was reviewed and is normal indicating that she has a very good prognosis for recovery, but that it can take months for complete  resolution of symptoms.  PAIN:  Are you having pain? Yes: NPRS scale: 5/10 Nerve sensation in toes and fingers Numbness in low back/sacrum   Yes: NPRS scale: 5/10 L knee Gets worse with deep squats, going down stairs, getting up/down from the floor  PRECAUTIONS: Fall  PATIENT GOALS: be able to do stairs normally, get my balance, be able to squat or sit on the ground, go hiking, get back to my normal life  OBJECTIVE:  Note: Objective measures were completed at Evaluation unless otherwise noted.  DIAGNOSTIC FINDINGS:  NCS/EMG of the left side 05/30/2023: This is a normal study of the left upper and lower extremities.  In particular, there is no evidence of a large fiber sensorimotor polyneuropathy or cervical/lumbosacral radiculopathy.   MRI lumbar spine 04/20/2023: 1. Diffuse thickening and enhancement of the nerve roots of the cauda equina. This is consistent with the clinical diagnosis of Guillain-Barre syndrome. The differential diagnosis includes an acute presentation of CIDP. 2. No significant disc protrusion or stenosis in the lumbar spine.   MRI cervical spine 04/15/2023: 1. Normal MRI appearance of the cervical spinal cord. 2. Mild noncompressive disc bulging at C2-3 through C6-7 without significant stenosis or overt neural impingement.   MRI brain wo contrast 04/15/2023: 1. No acute intracranial abnormality. 2. Subcentimeter remote lacunar infarct at the posterior aspect of the right lentiform nucleus. 3. Otherwise normal brain MRI.                                                                                                                              TREATMENT:   Gait Gait on treadmill at 1.0-1.2 mph x 15 min with gradual increase to incline of  3%. Pt ambulates x 0.29 miles. RPE 8/10 following gait on treadmill. Pt more fatigued by treadmill training this date and requires a seated rest break to recover.   NMR To work on functional LE strengthening and SLS  stability: Alt L/R cone taps while standing on blue airex 2 x 15 reps B Decreased stability while standing on LLE as task progresses SLS on blue airex with opp LE on cone performing ball toss 2 x 15 reps B More difficulty balancing on LLE as compared to RLE, has to catch herself with UE on back of chair to prevent LOB several times   PATIENT EDUCATION: Education details: continue HEP Person educated: Patient Education method: Medical illustrator Education comprehension: verbalized understanding, returned demonstration, and needs further education  HOME EXERCISE PROGRAM: Access Code: [email or web address] URL: [email or web address] Date: 07/07/2023 Prepared by: Nickola Baron  Exercises - Standing Hip Extension with Counter Support  - 1 x daily - 7 x weekly - 3 sets - 10 reps - Standing March with Counter Support  - 1 x daily - 7 x weekly - 3 sets - 10 reps - Heel Raises with Counter Support  - 1 x daily - 7 x weekly - 3 sets - 10 reps - Sit to Stand  - 1 x daily - 7 x weekly - 3 sets - 5 reps - Step Up  - 1 x daily - 7 x weekly - 3 sets - 10 reps - Towel Scrunches  - 1 x daily - 7 x weekly - 3 sets - 10 reps - Standing Terminal Knee Extension with Resistance  - 1 x daily - 7 x weekly - 3 sets - 10 reps - Forward Step Down with Heel Tap and Counter Support  - 1 x daily - 7 x weekly - 3 sets - 10 reps - Forward Step Up with Counter Support  - 1 x daily - 7 x weekly - 3 sets - 10 reps  Verbally added 4/30: -resisted tall kneel mini-squats with blue theraband 3 x 10 reps  - Clamshell  - 1 x daily - 7 x weekly - 3 sets - 10 reps - Side Stepping with Resistance at Ankles and Counter Support  - 1 x daily - 7 x weekly - 3 sets - 10 reps - Forward and Backward Monster Walk with Resistance at Ankles and Counter Support  - 1 x daily - 7 x weekly - 3 sets - 10 reps - Supine Knee Extension Strengthening  - 1 x daily - 7 x weekly - 3 sets - 10 reps - 5 sec hold - Supine Knee to  Chest with Leg Straight  - 1 x daily - 7 x weekly - 3 sets - 10 reps - Seated Hamstring Curl with Anchored Resistance  - 1 x daily - 7 x weekly - 3 sets - 10 reps - Supine Posterior Pelvic Tilt  - 1 x daily - 7 x weekly - 1 sets - 1 reps - 5 sec hold - Supine Bridge  - 1 x daily - 7 x weekly - 3 sets - 10 reps - 5 sec hold - Supine March with Posterior Pelvic Tilt  - 1 x daily - 7 x weekly - 3 sets - 10 reps - 5 sec hold    GOALS: Goals reviewed with patient? Yes  SHORT TERM GOALS=LONG TERM GOALS due to length of POC   LONG TERM GOALS: Target date: 07/30/2023  Pt will be independent with final HEP  for improved strength, balance, transfers, endurance and gait. Baseline: pt independent with current HEP but additions ongoing (5/5) Goal status: IN PROGRESS  2.  Pt will improve 5 x STS to less than or equal to 25 seconds to demonstrate improved functional strength and transfer efficiency.  Baseline: 29.81 sec no UE (4/7), 16.35 sec no UE (5/5) Goal status: MET  3.  Pt will improve gait velocity to at least 2.0 ft/sec for improved gait efficiency and performance at mod I level  Baseline: 1.74 ft/sec mod I (4/7), 2.64 ft/sec mod I (5/5) Goal status: MET  4.  Pt will improve FGA to 22/30 for decreased fall risk  Baseline: 17/30 (4/7), 22/30 (5/5) Goal status: MET  5.  Patient will ambulate >/=1075ft during to demonstrate improved endurance.  Baseline: 71ft, 908 ft (5/5) Goal status: IN PROGRESS  NEW SHORT TERM GOALS=LONG TERM GOALS due to length of POC   NEW LONG TERM GOALS:  Target date: 08/25/2023  Pt will be independent with final HEP for improved strength, balance, transfers, endurance and gait. Baseline: pt independent with current HEP but additions ongoing (5/5) Goal status: MET  2.  Pt will improve 5 x STS to less than or equal to 15 seconds to demonstrate improved functional strength and transfer efficiency.  Baseline: 29.81 sec no UE (4/7), 16.35 sec no UE (5/5), 23.3  sec no UE (6/2) Goal status: NOT MET - knee pain  3.  Pt will improve gait velocity to at least 2.8 ft/sec for improved gait efficiency and performance at mod I level  Baseline: 1.74 ft/sec mod I (4/7), 2.64 ft/sec mod I (5/5), 3.0 ft/sec mod I (6/2) Goal status: MET  4.  Pt will improve FGA to 26/30 for decreased fall risk  Baseline: 17/30 (4/7), 22/30 (5/5), 23/30 (6/2) Goal status: IN PROGRESS  5.  Patient will ambulate >/=1047ft during to demonstrate improved endurance.  Baseline: 739ft, 908 ft (5/5), 1267 ft RPE 6/10 (6/2) Goal status: MET   NEW NEW SHORT TERM GOALS=LONG TERM GOALS due to length of POC   NEW NEW LONG TERM GOALS:  Target date: 09/24/2023   Pt will improve 5 x STS to less than or equal to 15 seconds to demonstrate improved functional strength and transfer efficiency.  Baseline: 29.81 sec no UE (4/7), 16.35 sec no UE (5/5), 23.3 sec no UE (6/2) Goal status: IN PROGRESS  2.  Pt will improve gait velocity to at least 3.25 ft/sec for improved gait efficiency and performance at mod I level  Baseline: 1.74 ft/sec mod I (4/7), 2.64 ft/sec mod I (5/5), 3.0 ft/sec mod I (6/2) Goal status: INITIAL  3.  Pt will improve FGA to 26/30 for decreased fall risk  Baseline: 17/30 (4/7), 22/30 (5/5), 23/30 (6/2) Goal status: IN PROGRESS     ASSESSMENT:  CLINICAL IMPRESSION: Emphasis of skilled PT session on continuing to work on gait training/endurance on treadmill with increased speed and time this date as well as continuing to work on SLS stability. Pt with increased fatigue following treadmill training this date with seated rest break required to recover. Pt continues to exhibit decreased LLE strength as compared to her RLE with decreased balance noted with SLS on her LLE. She continues to benefit from skilled PT services for functional strengthening to continue to work towards return to her PLOF. Continue POC.    OBJECTIVE IMPAIRMENTS: Abnormal gait, cardiopulmonary  status limiting activity, decreased activity tolerance, decreased balance, decreased coordination, decreased endurance, decreased knowledge of  condition, decreased mobility, difficulty walking, decreased strength, impaired perceived functional ability, impaired sensation, and pain.   ACTIVITY LIMITATIONS: carrying, lifting, bending, sitting, squatting, stairs, transfers, and bed mobility  PARTICIPATION LIMITATIONS: meal prep, cleaning, laundry, occupation, and yard work  PERSONAL FACTORS: 1-2 comorbidities:   diabetes mellitus, hypertension, hyperlipidemia are also affecting patient's functional outcome.   REHAB POTENTIAL: Excellent  CLINICAL DECISION MAKING: Stable/uncomplicated  EVALUATION COMPLEXITY: Low  PLAN:  PT FREQUENCY: 2x/week x 2 weeks then 1x/week for 2 weeks (recert) + 1x/week (recert)  PT DURATION: 4 weeks + 4 weeks (recert) + 4 weeks (recert)  PLANNED INTERVENTIONS: 16109- PT Re-evaluation, 97110-Therapeutic exercises, 97530- Therapeutic activity, 97112- Neuromuscular re-education, 97535- Self Care, 60454- Manual therapy, [phone]- Gait training, [phone]- Orthotic Fit/training, [phone]- Aquatic Therapy, [phone]- Electrical stimulation (manual), Patient/Family education, Balance training, Stair training, Taping, Dry Needling, Joint mobilization, DME instructions, Cryotherapy, and Moist heat  PLAN FOR NEXT SESSION: functional strengthening for BLE (sit to stands, step taps or step ups, lateral stepping, monster walks), work towards return to hiking and gardening, endurance, half kneel to tall kneel transition, quadruped, hiking outdoors, L knee strengthening, B hip strengthening, SLS, Bosu squats, SciFit level 3.5 to 4, can also continue to work on gardening tasks and getting up/down from the floor, how is L knee pain and tailbone pain?, hip distraction, treadmill -  (1.2 mph with 3% incline)   Lorita Rosa, PT Lorita Rosa, PT, DPT, CSRS   09/08/2023, 10:12 AM

## 2023-09-12 ENCOUNTER — Encounter: Payer: 59 | Admitting: Family Medicine

## 2023-09-15 ENCOUNTER — Ambulatory Visit: Admitting: Neurology

## 2023-09-16 ENCOUNTER — Ambulatory Visit: Payer: Self-pay | Admitting: Physical Therapy

## 2023-09-16 DIAGNOSIS — R2681 Unsteadiness on feet: Secondary | ICD-10-CM

## 2023-09-16 DIAGNOSIS — M6281 Muscle weakness (generalized): Secondary | ICD-10-CM

## 2023-09-16 DIAGNOSIS — R278 Other lack of coordination: Secondary | ICD-10-CM

## 2023-09-16 DIAGNOSIS — R2689 Other abnormalities of gait and mobility: Secondary | ICD-10-CM

## 2023-09-16 NOTE — Therapy (Signed)
 OUTPATIENT PHYSICAL THERAPY NEURO TREATMENT   Patient Name: [facility] MRN: [id number] DOB:Jul 31, 1970, 53 y.o., female Today's Date: 09/16/2023   PCP: Rilla Baller, MD REFERRING PROVIDER: Tobie Tonita POUR, DO    END OF SESSION:   PT End of Session - 09/16/23 1101     Visit Number 19    Number of Visits 20   recert   Date for PT Re-Evaluation 09/29/23   recert, to allow for scheduling delays   Authorization Type [facility]    PT Start Time 1100    PT Stop Time 1143    PT Time Calculation (min) 43 min    Equipment Utilized During Treatment Gait belt    Activity Tolerance Patient tolerated treatment well    Behavior During Therapy [facility] for tasks assessed/performed                     Past Medical History:  Diagnosis Date   ALLERGIC RHINITIS 05/17/2007   GANGLION CYST, WRIST, RIGHT 05/31/2008   Hyperlipidemia    OVERACTIVE BLADDER 05/15/2007   Prediabetes    Past Surgical History:  Procedure Laterality Date   CHOLECYSTECTOMY     Patient Active Problem List   Diagnosis Date Noted   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   Neuropathy 04/19/2023   GBS (Guillain Barre syndrome) (HCC) 04/19/2023   Thyroid  nodule 04/17/2023   Postmenopausal 04/17/2023   Back pain 04/17/2023   History of stroke in adulthood 04/16/2023   Disturbance of skin sensation 04/16/2023   Elevated blood protein 04/16/2023   Paresthesias 04/14/2023   Travel advice encounter 09/16/2022   Menopausal state 09/16/2022   Borderline hypothyroidism 03/18/2022   Family history of stroke (cerebrovascular) 03/08/2022   Type 2 diabetes mellitus without complication, without long-term current use of insulin  (HCC) 12/07/2021   Dermatitis of both ear canals 08/29/2021   Hyperlipidemia associated with type 2 diabetes mellitus (HCC) 06/26/2020   Hives 10/18/2015   Essential hypertension 03/22/2015   Aphthous ulcer of mouth 12/16/2011   Health maintenance examination 12/07/2010   Allergic  rhinitis 05/17/2007    ONSET DATE: 06/23/2023 (referral date, symptom onset 04/11/2023)  REFERRING DIAG: G61.0 (ICD-10-CM) - GBS (Guillain Barre syndrome) (HCC) R26.81 (ICD-10-CM) - Unsteady gait M79.2 (ICD-10-CM) - Neuropathic pain  THERAPY DIAG:  Muscle weakness (generalized)  Unsteadiness on feet  Other abnormalities of gait and mobility  Other lack of coordination  Rationale for Evaluation and Treatment: Rehabilitation  SUBJECTIVE:  SUBJECTIVE STATEMENT: [name]  Pt reports she feeling more fatigued, has less energy due to the ongoing heat wave. She is finding it hard to sleep at night due to the heat. She continues to have ongoing tailbone pain and L knee pain. Things are not really improving at this point but also not getting worse.  Pt sees Dr. Tobie on 7/1.   Pt accompanied by: self  PERTINENT HISTORY:  PMH: diabetes mellitus, hypertension, hyperlipidemia  Per Dr. Tobie note 06/11/23: Guillain-Barre syndrome manifesting with progressive paresthesias, generalized weakness, and bulbar weakness (Jan 2024), supported by CSF findings (protein > 600) and MRI lumbar spine showing diffuse nerve root enhancement. She was treated with IVIG and made significant improvements. Exam shows mild weakness in the legs and distal sensory loss. Reflexes are present at the knees and trace at the ankles (improved). NCS/EMG was reviewed and is normal indicating that she has a very good prognosis for recovery, but that it can take months for complete resolution of symptoms.  PAIN:  Are you having pain? Yes: NPRS scale: 5/10 Nerve sensation in toes and fingers Numbness in low back/sacrum   Yes: NPRS scale: 5/10 L knee Gets worse with deep squats, going down stairs, getting up/down from the  floor  PRECAUTIONS: Fall  PATIENT GOALS: be able to do stairs normally, get my balance, be able to squat or sit on the ground, go hiking, get back to my normal life  OBJECTIVE:  Note: Objective measures were completed at Evaluation unless otherwise noted.  DIAGNOSTIC FINDINGS:  NCS/EMG of the left side 05/30/2023: This is a normal study of the left upper and lower extremities.  In particular, there is no evidence of a large fiber sensorimotor polyneuropathy or cervical/lumbosacral radiculopathy.   MRI lumbar spine 04/20/2023: 1. Diffuse thickening and enhancement of the nerve roots of the cauda equina. This is consistent with the clinical diagnosis of Guillain-Barre syndrome. The differential diagnosis includes an acute presentation of CIDP. 2. No significant disc protrusion or stenosis in the lumbar spine.   MRI cervical spine 04/15/2023: 1. Normal MRI appearance of the cervical spinal cord. 2. Mild noncompressive disc bulging at C2-3 through C6-7 without significant stenosis or overt neural impingement.   MRI brain wo contrast 04/15/2023: 1. No acute intracranial abnormality. 2. Subcentimeter remote lacunar infarct at the posterior aspect of the right lentiform nucleus. 3. Otherwise normal brain MRI.                                                                                                                              TREATMENT:   Gait Gait on treadmill at 1.2 mph x 15 min with gradual increase to incline of 4%. Pt ambulates x 0.30 miles. RPE 8/10 following gait on treadmill. Pt fatigued by treadmill training this date and requires a seated rest break to recover.   NMR To work on functional LE strengthening: Bosu squats in // bars with BUE support 3 x 10  reps Mild L knee pain At ballet bar but without UE support R/L hip half circles sweeps forwards/backwards 2 x 10 reps B More difficulty with LLE as compared to RLE Transtioned to staggered L hip sweeps forwards/backwards  to 3 vinyl dots for decreased strain on L hip x 10 reps    PATIENT EDUCATION: Education details: continue HEP, PT POC with plan to discuss POC next visit after she sees Dr. Tobie Person educated: Patient Education method: Explanation and Demonstration Education comprehension: verbalized understanding, returned demonstration, and needs further education  HOME EXERCISE PROGRAM: Access Code: [email or web address] URL: [email or web address] Date: 07/07/2023 Prepared by: Delon Pop  Exercises - Standing Hip Extension with Counter Support  - 1 x daily - 7 x weekly - 3 sets - 10 reps - Standing March with Counter Support  - 1 x daily - 7 x weekly - 3 sets - 10 reps - Heel Raises with Counter Support  - 1 x daily - 7 x weekly - 3 sets - 10 reps - Sit to Stand  - 1 x daily - 7 x weekly - 3 sets - 5 reps - Step Up  - 1 x daily - 7 x weekly - 3 sets - 10 reps - Towel Scrunches  - 1 x daily - 7 x weekly - 3 sets - 10 reps - Standing Terminal Knee Extension with Resistance  - 1 x daily - 7 x weekly - 3 sets - 10 reps - Forward Step Down with Heel Tap and Counter Support  - 1 x daily - 7 x weekly - 3 sets - 10 reps - Forward Step Up with Counter Support  - 1 x daily - 7 x weekly - 3 sets - 10 reps  Verbally added 4/30: -resisted tall kneel mini-squats with blue theraband 3 x 10 reps  - Clamshell  - 1 x daily - 7 x weekly - 3 sets - 10 reps - Side Stepping with Resistance at Ankles and Counter Support  - 1 x daily - 7 x weekly - 3 sets - 10 reps - Forward and Backward Monster Walk with Resistance at Ankles and Counter Support  - 1 x daily - 7 x weekly - 3 sets - 10 reps - Supine Knee Extension Strengthening  - 1 x daily - 7 x weekly - 3 sets - 10 reps - 5 sec hold - Supine Knee to Chest with Leg Straight  - 1 x daily - 7 x weekly - 3 sets - 10 reps - Seated Hamstring Curl with Anchored Resistance  - 1 x daily - 7 x weekly - 3 sets - 10 reps - Supine Posterior Pelvic Tilt  - 1 x daily - 7 x  weekly - 1 sets - 1 reps - 5 sec hold - Supine Bridge  - 1 x daily - 7 x weekly - 3 sets - 10 reps - 5 sec hold - Supine March with Posterior Pelvic Tilt  - 1 x daily - 7 x weekly - 3 sets - 10 reps - 5 sec hold    GOALS: Goals reviewed with patient? Yes  SHORT TERM GOALS=LONG TERM GOALS due to length of POC   LONG TERM GOALS: Target date: 07/30/2023  Pt will be independent with final HEP for improved strength, balance, transfers, endurance and gait. Baseline: pt independent with current HEP but additions ongoing (5/5) Goal status: IN PROGRESS  2.  Pt will improve 5 x STS to less than or equal to 25  seconds to demonstrate improved functional strength and transfer efficiency.  Baseline: 29.81 sec no UE (4/7), 16.35 sec no UE (5/5) Goal status: MET  3.  Pt will improve gait velocity to at least 2.0 ft/sec for improved gait efficiency and performance at mod I level  Baseline: 1.74 ft/sec mod I (4/7), 2.64 ft/sec mod I (5/5) Goal status: MET  4.  Pt will improve FGA to 22/30 for decreased fall risk  Baseline: 17/30 (4/7), 22/30 (5/5) Goal status: MET  5.  Patient will ambulate >/=1049ft during to demonstrate improved endurance.  Baseline: 766ft, 908 ft (5/5) Goal status: IN PROGRESS  NEW SHORT TERM GOALS=LONG TERM GOALS due to length of POC   NEW LONG TERM GOALS:  Target date: 08/25/2023  Pt will be independent with final HEP for improved strength, balance, transfers, endurance and gait. Baseline: pt independent with current HEP but additions ongoing (5/5) Goal status: MET  2.  Pt will improve 5 x STS to less than or equal to 15 seconds to demonstrate improved functional strength and transfer efficiency.  Baseline: 29.81 sec no UE (4/7), 16.35 sec no UE (5/5), 23.3 sec no UE (6/2) Goal status: NOT MET - knee pain  3.  Pt will improve gait velocity to at least 2.8 ft/sec for improved gait efficiency and performance at mod I level  Baseline: 1.74 ft/sec mod I (4/7), 2.64  ft/sec mod I (5/5), 3.0 ft/sec mod I (6/2) Goal status: MET  4.  Pt will improve FGA to 26/30 for decreased fall risk  Baseline: 17/30 (4/7), 22/30 (5/5), 23/30 (6/2) Goal status: IN PROGRESS  5.  Patient will ambulate >/=1037ft during to demonstrate improved endurance.  Baseline: 775ft, 908 ft (5/5), 1267 ft RPE 6/10 (6/2) Goal status: MET   NEW NEW SHORT TERM GOALS=LONG TERM GOALS due to length of POC   NEW NEW LONG TERM GOALS:  Target date: 09/24/2023   Pt will improve 5 x STS to less than or equal to 15 seconds to demonstrate improved functional strength and transfer efficiency.  Baseline: 29.81 sec no UE (4/7), 16.35 sec no UE (5/5), 23.3 sec no UE (6/2) Goal status: IN PROGRESS  2.  Pt will improve gait velocity to at least 3.25 ft/sec for improved gait efficiency and performance at mod I level  Baseline: 1.74 ft/sec mod I (4/7), 2.64 ft/sec mod I (5/5), 3.0 ft/sec mod I (6/2) Goal status: INITIAL  3.  Pt will improve FGA to 26/30 for decreased fall risk  Baseline: 17/30 (4/7), 22/30 (5/5), 23/30 (6/2) Goal status: IN PROGRESS     ASSESSMENT:  CLINICAL IMPRESSION: Emphasis of skilled PT session on continuing to work on gait training/endurance on treadmill with increased speed, time, and incline this date as well as continuing to work on functional LE strengthening. Pt with ongoing fatigue following treadmill training this date with seated rest break required to recover. Pt continues to exhibit decreased LLE strength as compared to her RLE with decreased tolerance for strengthening exercises with her LLE as compared to her RLE. She continues to benefit from skilled PT services for functional strengthening to continue to work towards return to her PLOF. Continue POC.    OBJECTIVE IMPAIRMENTS: Abnormal gait, cardiopulmonary status limiting activity, decreased activity tolerance, decreased balance, decreased coordination, decreased endurance, decreased knowledge of  condition, decreased mobility, difficulty walking, decreased strength, impaired perceived functional ability, impaired sensation, and pain.   ACTIVITY LIMITATIONS: carrying, lifting, bending, sitting, squatting, stairs, transfers, and bed mobility  PARTICIPATION  LIMITATIONS: meal prep, cleaning, laundry, occupation, and yard work  PERSONAL FACTORS: 1-2 comorbidities:   diabetes mellitus, hypertension, hyperlipidemia are also affecting patient's functional outcome.   REHAB POTENTIAL: Excellent  CLINICAL DECISION MAKING: Stable/uncomplicated  EVALUATION COMPLEXITY: Low  PLAN:  PT FREQUENCY: 2x/week x 2 weeks then 1x/week for 2 weeks (recert) + 1x/week (recert)  PT DURATION: 4 weeks + 4 weeks (recert) + 4 weeks (recert)  PLANNED INTERVENTIONS: 02835- PT Re-evaluation, 97110-Therapeutic exercises, 97530- Therapeutic activity, 97112- Neuromuscular re-education, 97535- Self Care, 02859- Manual therapy, [phone]- Gait training, [phone]- Orthotic Fit/training, [phone]- Aquatic Therapy, [phone]- Electrical stimulation (manual), Patient/Family education, Balance training, Stair training, Taping, Dry Needling, Joint mobilization, DME instructions, Cryotherapy, and Moist heat  PLAN FOR NEXT SESSION: 20th PN, d/c? functional strengthening for BLE (sit to stands, step taps or step ups, lateral stepping, monster walks), work towards return to hiking and gardening, endurance, half kneel to tall kneel transition, quadruped, hiking outdoors, L knee strengthening, B hip strengthening, SLS, Bosu squats, SciFit level 3.5 to 4, can also continue to work on gardening tasks and getting up/down from the floor, how is L knee pain and tailbone pain?, hip distraction, treadmill -  (1.2 mph with 3% incline)   Waddell Southgate, PT Waddell Southgate, PT, DPT, CSRS   09/16/2023, 11:43 AM

## 2023-09-23 ENCOUNTER — Encounter: Payer: Self-pay | Admitting: Neurology

## 2023-09-23 ENCOUNTER — Ambulatory Visit: Admitting: Neurology

## 2023-09-23 VITALS — BP 132/90 | HR 89 | Ht 61.0 in | Wt 143.0 lb

## 2023-09-23 DIAGNOSIS — G610 Guillain-Barre syndrome: Secondary | ICD-10-CM | POA: Diagnosis not present

## 2023-09-23 DIAGNOSIS — M792 Neuralgia and neuritis, unspecified: Secondary | ICD-10-CM

## 2023-09-23 DIAGNOSIS — R2681 Unsteadiness on feet: Secondary | ICD-10-CM

## 2023-09-23 MED ORDER — DULOXETINE HCL 30 MG PO CPEP: 30.0000 mg | ORAL_CAPSULE | Freq: Every day | ORAL | 3 refills | Status: DC

## 2023-09-23 NOTE — Progress Notes (Signed)
 Follow-up Visit   Date: 09/23/2023    Becky Shaw MRN: [id number] DOB: [birth year]    Becky Shaw is a 53 y.o. right-handed female with diabetes mellitus, hypertension, hyperlipidemia returning to the clinic for follow-up of Guillain-Barre syndrome.  The patient was accompanied to the clinic by self.  IMPRESSION/PLAN: Guillain-Barre syndrome (03/2023) manifesting with manifesting with progressive paresthesias, generalized weakness, and bulbar weakness supported by CSF findings (protein > 600) and diffuse nerve root enhancement in MRI.  She was treated with IVIG and made significant improvements.   Exam shows mild distal weakness in the feet, gait unsteadiness with reduced reflexes in the legs.  She has ongoing sensory loss, imbalance, and weakness which should continue to improve with time.    - Start duloxetine 30mg  at bedtime.  OK to titrate as needed - Stop gabapentin  (weight gain)  - Continue physical therapy for balance training  Return to clinic in 6 months    --------------------------------------------- History of present illness: Starting on January 17th, she began having numbness/tingling of the toes and over the next few days, she began having more intense symptoms in the hands, mouth, tongue, and back.  She was also having generalized weakness.  She went to the ER and was recommended out-patient follow-up with neurology.  A few days later, she became so weak that she was unable to walk, which prompted her to go to [facility].     She was evaluated by neurology who had high clinical suspicion for GBS and CSF testing confirmed the presence of albuminocytologic dissociation (CSF protein > 600), MRI lumbar spine with nerve root enhancement.  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  She was treated with IVIG 2g/kg over 5 days.  She has been more aware of temperature and tingling is less.  She continues to have diffuse numbness and weakness.  She also has facial  weakness with incomplete eyeclosure on the right and is unable to smile. She is walking with a walker.     She works as a Research scientist (medical) at [employer].  She lives with husband and 31 year old son.  She does not smoke or drink alcohol.   UPDATE 06/11/2023:  She is here for follow-up visit.  She has completed home PT/OT and able to walk unassisted, but still is cautious because of imbalance.  She is not able to walk as fast as she previously could.  Her strength is improving.  She is able to swallow better and arms are stronger.  She tried to go to her office to work but noticed that typing and opening envelopes creates increased sensitivity of the fingertips and tingling.  She continues to have numbness in her back and feet.   UPDATE 09/23/2023:  She is here for follow-up visit.  Overall, she is doing well but has noticed a few additional symptoms.  She continues to have numbness involving the tailbone region. She had burning sensation involving the heels.  She takes gabapentin  600mg  at bedtime. She had left knee pain upon standing.  She has episodic shooting pain involving the right leg into the feet.  She had night time excessive sweating which occurs several times per night.  She also reports that she does not get any bubbles when shampooing her hair and it feels sticky.     Medications:  Current Outpatient Medications on File Prior to Visit  Medication Sig Dispense Refill   amLODipine  (NORVASC ) 2.5 MG tablet Take 1 tablet (2.5 mg total) by mouth daily.  90 tablet 4   aspirin  EC 81 MG tablet Take 1 tablet (81 mg total) by mouth daily. Swallow whole.     atorvastatin  (LIPITOR) 10 MG tablet Take 1 tablet (10 mg total) by mouth every Monday, Wednesday, and Friday.     gabapentin  (NEURONTIN ) 300 MG capsule Take 2 capsules (600 mg total) by mouth at bedtime.     metFORMIN  (GLUCOPHAGE -XR) 500 MG 24 hr tablet Take 1 tablet (500 mg total) by mouth daily with breakfast. 90 tablet 1   No current  facility-administered medications on file prior to visit.    Allergies:  Allergies  Allergen Reactions   Cetirizine Hcl     REACTION: insomnia    Vital Signs:  BP (!) 132/90   Pulse 89   Ht 5' 1 (1.549 m)   Wt 143 lb (64.9 kg)   SpO2 98%   BMI 27.02 kg/m   Neurological Exam: MENTAL STATUS including orientation to time, place, person, recent and remote memory, attention span and concentration, language, and fund of knowledge is normal.  Speech is not dysarthric.  CRANIAL NERVES:  No visual field defects.  Pupils equal round and reactive to light.  Normal conjugate, extra-ocular eye movements in all directions of gaze.  No ptosis. Mild periorbital edema.  Face is symmetric, mild facial weakness with smile.    MOTOR:  No atrophy, fasciculations or abnormal movements.  No pronator drift.   Upper Extremity:  Right  Left  Deltoid  5/5   5/5   Biceps  5/5   5/5   Triceps  5/5   5/5   Wrist extensors  5/5   5/5   Wrist flexors  5/5   5/5   Finger extensors  5/5   5/5   Finger flexors  5/5   5/5   Dorsal interossei  5/5   5/5   Abductor pollicis  5/5   5/5   Tone (Ashworth scale)  0  0   Lower Extremity:  Right  Left  Hip flexors  5/5   5/5   Knee flexors  5/5   5/5   Knee extensors  5/5   5/5   Dorsiflexors  5-/5   5-/5   Plantarflexors  5-/5   5-/5   Toe extensors  5-/5   5-/5   Toe flexors  5-/5   5-/5   Tone (Ashworth scale)  0  0   MSRs:                                           Right        Left brachioradialis 2+  2+  biceps 2+  2+  triceps 2+  2+  patellar 2+  2+  ankle jerk tr  tr   SENSORY:  Intact to vibration throughout.  Reduced temperature at the feet.  COORDINATION/GAIT:   Gait mildly wide-based and stable, unassisted, slow.    Data: NCS/EMG of the left side 05/30/2023: This is a normal study of the left upper and lower extremities.  In particular, there is no evidence of a large fiber sensorimotor polyneuropathy or cervical/lumbosacral  radiculopathy.   MRI lumbar spine 04/20/2023: 1. Diffuse thickening and enhancement of the nerve roots of the cauda equina. This is consistent with the clinical diagnosis of Guillain-Barre syndrome. The differential diagnosis includes an acute presentation of CIDP. 2. No significant disc protrusion  or stenosis in the lumbar spine.   MRI cervical spine 04/15/2023: 1. Normal MRI appearance of the cervical spinal cord. 2. Mild noncompressive disc bulging at C2-3 through C6-7 without significant stenosis or overt neural impingement.   MRI brain wo contrast 04/15/2023: 1. No acute intracranial abnormality. 2. Subcentimeter remote lacunar infarct at the posterior aspect of the right lentiform nucleus. 3. Otherwise normal brain MRI.   CSF 04/19/2023:  R19 R9 P>600 G144 encephalitis panel neg, IgG index - unable to be calculated   Thank you for allowing me to participate in patient's care.  If I can answer any additional questions, I would be pleased to do so.    Sincerely,    Disaya Walt K. Tobie, DO

## 2023-09-23 NOTE — Patient Instructions (Addendum)
 Start duloxetine 30mg  daily.  Please send a MyChart message in 1 month to determine if we need to increase the dose.   Reduce gabapentin  to 300mg  at bedtime x 3 days, then stop.  Continue PT

## 2023-09-24 ENCOUNTER — Ambulatory Visit: Payer: Self-pay | Attending: Neurology | Admitting: Physical Therapy

## 2023-09-24 DIAGNOSIS — R2689 Other abnormalities of gait and mobility: Secondary | ICD-10-CM | POA: Insufficient documentation

## 2023-09-24 DIAGNOSIS — M6281 Muscle weakness (generalized): Secondary | ICD-10-CM | POA: Insufficient documentation

## 2023-09-24 DIAGNOSIS — R2681 Unsteadiness on feet: Secondary | ICD-10-CM | POA: Insufficient documentation

## 2023-09-24 DIAGNOSIS — R278 Other lack of coordination: Secondary | ICD-10-CM | POA: Insufficient documentation

## 2023-09-24 NOTE — Therapy (Signed)
 OUTPATIENT PHYSICAL THERAPY NEURO TREATMENT - 20th VISIT PROGRESS NOTE and DISCHARGE NOTE   Patient Name: Becky Shaw MRN: [id number] DOB:[birth year], 53 y.o., female Today's Date: 09/24/2023   PCP: Rilla Baller, MD REFERRING PROVIDER: Patel, Donika K, DO  Physical Therapy Progress Note   Dates of Reporting Period: 08/04/2023 - 09/24/2023  See Note below for Objective Data and Assessment of Progress/Goals.  Thank you for the referral of this patient. Waddell Southgate, PT, DPT, CSRS  PHYSICAL THERAPY DISCHARGE SUMMARY  Visits from Start of Care: 20  Current functional level related to goals / functional outcomes: Mod I   Remaining deficits: Ongoing decreased functional strength in BLE, higher level balance impairments, impaired endurance, L knee pain and tailbone pain, and decreased gait speed   Education / Equipment: Handout for final HEP   Patient agrees to discharge. Patient goals were not met. Patient is being discharged due to maximized rehab potential.       END OF SESSION:   PT End of Session - 09/24/23 1101     Visit Number 20    Number of Visits 20   recert   Date for PT Re-Evaluation 09/29/23   recert, to allow for scheduling delays   Authorization Type [facility]    PT Start Time 1100    PT Stop Time 1123    PT Time Calculation (min) 23 min    Equipment Utilized During Treatment Gait belt    Activity Tolerance Patient tolerated treatment well    Behavior During Therapy [facility] for tasks assessed/performed                      Past Medical History:  Diagnosis Date   ALLERGIC RHINITIS 05/17/2007   GANGLION CYST, WRIST, RIGHT 05/31/2008   Hyperlipidemia    OVERACTIVE BLADDER 05/15/2007   Prediabetes    Past Surgical History:  Procedure Laterality Date   CHOLECYSTECTOMY     Patient Active Problem List   Diagnosis Date Noted   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   Neuropathy 04/19/2023   GBS (Guillain Barre syndrome) (HCC)  04/19/2023   Thyroid  nodule 04/17/2023   Postmenopausal 04/17/2023   Back pain 04/17/2023   History of stroke in adulthood 04/16/2023   Disturbance of skin sensation 04/16/2023   Elevated blood protein 04/16/2023   Paresthesias 04/14/2023   Travel advice encounter 09/16/2022   Menopausal state 09/16/2022   Borderline hypothyroidism 03/18/2022   Family history of stroke (cerebrovascular) 03/08/2022   Type 2 diabetes mellitus without complication, without long-term current use of insulin  (HCC) 12/07/2021   Dermatitis of both ear canals 08/29/2021   Hyperlipidemia associated with type 2 diabetes mellitus (HCC) 06/26/2020   Hives 10/18/2015   Essential hypertension 03/22/2015   Aphthous ulcer of mouth 12/16/2011   Health maintenance examination 12/07/2010   Allergic rhinitis 05/17/2007    ONSET DATE: 06/23/2023 (referral date, symptom onset 04/11/2023)  REFERRING DIAG: G61.0 (ICD-10-CM) - GBS (Guillain Barre syndrome) (HCC) R26.81 (ICD-10-CM) - Unsteady gait M79.2 (ICD-10-CM) - Neuropathic pain  THERAPY DIAG:  Muscle weakness (generalized)  Unsteadiness on feet  Other abnormalities of gait and mobility  Other lack of coordination  Rationale for Evaluation and Treatment: Rehabilitation  SUBJECTIVE:  SUBJECTIVE STATEMENT: Becky Shaw  Pt did have her follow-up appointment with Dr. Tobie yesterday. She told her about her L knee pain, not getting bubbles when she is washing her hair/not feeling like her hair gets clean, her near fall, that she continues to sweat more at night and is having trouble sleeping, and about her ongoing electric shock pain in her legs and her tailbone pain. Pt also reports that she gained weight from being on gabapentin , is going to switch to another medication for nerve pain. Pt  reports that Dr. Tobie also encouraged her to continue with PT.  Pt reports that over the past week she has continued to feel about the same, no acute changes.  Pt accompanied by: self  PERTINENT HISTORY:  PMH: diabetes mellitus, hypertension, hyperlipidemia  Per Dr. Tobie note 06/11/23: Guillain-Barre syndrome manifesting with progressive paresthesias, generalized weakness, and bulbar weakness (Jan 2024), supported by CSF findings (protein > 600) and MRI lumbar spine showing diffuse nerve root enhancement. She was treated with IVIG and made significant improvements. Exam shows mild weakness in the legs and distal sensory loss. Reflexes are present at the knees and trace at the ankles (improved). NCS/EMG was reviewed and is normal indicating that she has a very good prognosis for recovery, but that it can take months for complete resolution of symptoms.  PAIN:  Are you having pain? Yes: NPRS scale: 5/10 Nerve sensation in toes and fingers Numbness in low back/sacrum   Yes: NPRS scale: 5/10 L knee Gets worse with deep squats, going down stairs, getting up/down from the floor  PRECAUTIONS: Fall  PATIENT GOALS: be able to do stairs normally, get my balance, be able to squat or sit on the ground, go hiking, get back to my normal life  OBJECTIVE:  Note: Objective measures were completed at Evaluation unless otherwise noted.  DIAGNOSTIC FINDINGS:  NCS/EMG of the left side 05/30/2023: This is a normal study of the left upper and lower extremities.  In particular, there is no evidence of a large fiber sensorimotor polyneuropathy or cervical/lumbosacral radiculopathy.   MRI lumbar spine 04/20/2023: 1. Diffuse thickening and enhancement of the nerve roots of the cauda equina. This is consistent with the clinical diagnosis of Guillain-Barre syndrome. The differential diagnosis includes an acute presentation of CIDP. 2. No significant disc protrusion or stenosis in the lumbar spine.   MRI  cervical spine 04/15/2023: 1. Normal MRI appearance of the cervical spinal cord. 2. Mild noncompressive disc bulging at C2-3 through C6-7 without significant stenosis or overt neural impingement.   MRI brain wo contrast 04/15/2023: 1. No acute intracranial abnormality. 2. Subcentimeter remote lacunar infarct at the posterior aspect of the right lentiform nucleus. 3. Otherwise normal brain MRI.                                                                                                                              TREATMENT:   Physical Performance For LTG assessment:  [facility]  PT Assessment - 09/24/23 1115       Ambulation/Gait   Gait velocity 32.8 ft over 16 sec = 2.01 ft/sec      Standardized Balance Assessment   Standardized Balance Assessment Five Times Sit to Stand    Five times sit to stand comments  16 sec   no UE     Functional Gait  Assessment   Gait assessed  Yes    Gait Level Surface Walks 20 ft, slow speed, abnormal gait pattern, evidence for imbalance or deviates 10-15 in outside of the 12 in walkway width. Requires more than 7 sec to ambulate 20 ft.    Change in Gait Speed Able to change speed, demonstrates mild gait deviations, deviates 6-10 in outside of the 12 in walkway width, or no gait deviations, unable to achieve a major change in velocity, or uses a change in velocity, or uses an assistive device.    Gait with Horizontal Head Turns Performs head turns smoothly with no change in gait. Deviates no more than 6 in outside 12 in walkway width    Gait with Vertical Head Turns Performs head turns with no change in gait. Deviates no more than 6 in outside 12 in walkway width.    Gait and Pivot Turn Pivot turns safely in greater than 3 sec and stops with no loss of balance, or pivot turns safely within 3 sec and stops with mild imbalance, requires small steps to catch balance.    Step Over Obstacle Is able to step over one shoe box (4.5 in total height) but must slow down and  adjust steps to clear box safely. May require verbal cueing.    Gait with Narrow Base of Support Is able to ambulate for 10 steps heel to toe with no staggering.    Gait with Eyes Closed Walks 20 ft, uses assistive device, slower speed, mild gait deviations, deviates 6-10 in outside 12 in walkway width. Ambulates 20 ft in less than 9 sec but greater than 7 sec.    Ambulating Backwards Walks 20 ft, no assistive devices, good speed, no evidence for imbalance, normal gait    Steps Alternating feet, must use rail.    Total Score 22    FGA comment: 22/30         Self-Care/Home Management Dicussed plateau regarding functional outcome measures as well as her progress in therapy over the past few weeks. Continued to education patient on recovery process from GBS and that nerves are slow to heal. Encouraged patient to continue working on her prescribed HEP as well as walking and that she can return to PT in about 3 months if needed. Pt agreeable to d/c this session and continue to work on exercise independently at home.    PATIENT EDUCATION: Education details: continue HEP, PT POC with plan to d/c this visit, results of OM and functional implications, recovery process for GBS Person educated: Patient Education method: Explanation and Demonstration Education comprehension: verbalized understanding and returned demonstration  HOME EXERCISE PROGRAM: Access Code: [email or web address] URL: [email or web address] Date: 07/07/2023 Prepared by: Delon Pop  Exercises - Standing Hip Extension with Counter Support  - 1 x daily - 7 x weekly - 3 sets - 10 reps - Standing March with Counter Support  - 1 x daily - 7 x weekly - 3 sets - 10 reps - Heel Raises with Counter Support  - 1 x daily - 7 x weekly - 3 sets - 10 reps - Sit to Stand  -  1 x daily - 7 x weekly - 3 sets - 5 reps - Step Up  - 1 x daily - 7 x weekly - 3 sets - 10 reps - Towel Scrunches  - 1 x daily - 7 x weekly - 3 sets - 10 reps -  Standing Terminal Knee Extension with Resistance  - 1 x daily - 7 x weekly - 3 sets - 10 reps - Forward Step Down with Heel Tap and Counter Support  - 1 x daily - 7 x weekly - 3 sets - 10 reps - Forward Step Up with Counter Support  - 1 x daily - 7 x weekly - 3 sets - 10 reps  Verbally added 4/30: -resisted tall kneel mini-squats with blue theraband 3 x 10 reps  - Clamshell  - 1 x daily - 7 x weekly - 3 sets - 10 reps - Side Stepping with Resistance at Ankles and Counter Support  - 1 x daily - 7 x weekly - 3 sets - 10 reps - Forward and Backward Monster Walk with Resistance at Ankles and Counter Support  - 1 x daily - 7 x weekly - 3 sets - 10 reps - Supine Knee Extension Strengthening  - 1 x daily - 7 x weekly - 3 sets - 10 reps - 5 sec hold - Supine Knee to Chest with Leg Straight  - 1 x daily - 7 x weekly - 3 sets - 10 reps - Seated Hamstring Curl with Anchored Resistance  - 1 x daily - 7 x weekly - 3 sets - 10 reps - Supine Posterior Pelvic Tilt  - 1 x daily - 7 x weekly - 1 sets - 1 reps - 5 sec hold - Supine Bridge  - 1 x daily - 7 x weekly - 3 sets - 10 reps - 5 sec hold - Supine March with Posterior Pelvic Tilt  - 1 x daily - 7 x weekly - 3 sets - 10 reps - 5 sec hold    GOALS: Goals reviewed with patient? Yes  SHORT TERM GOALS=LONG TERM GOALS due to length of POC   LONG TERM GOALS: Target date: 07/30/2023  Pt will be independent with final HEP for improved strength, balance, transfers, endurance and gait. Baseline: pt independent with current HEP but additions ongoing (5/5) Goal status: IN PROGRESS  2.  Pt will improve 5 x STS to less than or equal to 25 seconds to demonstrate improved functional strength and transfer efficiency.  Baseline: 29.81 sec no UE (4/7), 16.35 sec no UE (5/5) Goal status: MET  3.  Pt will improve gait velocity to at least 2.0 ft/sec for improved gait efficiency and performance at mod I level  Baseline: 1.74 ft/sec mod I (4/7), 2.64 ft/sec mod I  (5/5) Goal status: MET  4.  Pt will improve FGA to 22/30 for decreased fall risk  Baseline: 17/30 (4/7), 22/30 (5/5) Goal status: MET  5.  Patient will ambulate >/=102ft during to demonstrate improved endurance.  Baseline: 71ft, 908 ft (5/5) Goal status: IN PROGRESS  NEW SHORT TERM GOALS=LONG TERM GOALS due to length of POC   NEW LONG TERM GOALS:  Target date: 08/25/2023  Pt will be independent with final HEP for improved strength, balance, transfers, endurance and gait. Baseline: pt independent with current HEP but additions ongoing (5/5) Goal status: MET  2.  Pt will improve 5 x STS to less than or equal to 15 seconds to demonstrate improved functional  strength and transfer efficiency.  Baseline: 29.81 sec no UE (4/7), 16.35 sec no UE (5/5), 23.3 sec no UE (6/2) Goal status: NOT MET - knee pain  3.  Pt will improve gait velocity to at least 2.8 ft/sec for improved gait efficiency and performance at mod I level  Baseline: 1.74 ft/sec mod I (4/7), 2.64 ft/sec mod I (5/5), 3.0 ft/sec mod I (6/2) Goal status: MET  4.  Pt will improve FGA to 26/30 for decreased fall risk  Baseline: 17/30 (4/7), 22/30 (5/5), 23/30 (6/2) Goal status: IN PROGRESS  5.  Patient will ambulate >/=1042ft during to demonstrate improved endurance.  Baseline: 779ft, 908 ft (5/5), 1267 ft RPE 6/10 (6/2) Goal status: MET   NEW NEW SHORT TERM GOALS=LONG TERM GOALS due to length of POC   NEW NEW LONG TERM GOALS:  Target date: 09/24/2023   Pt will improve 5 x STS to less than or equal to 15 seconds to demonstrate improved functional strength and transfer efficiency.  Baseline: 29.81 sec no UE (4/7), 16.35 sec no UE (5/5), 23.3 sec no UE (6/2), 16 sec no UE (7/2) Goal status: NOT MET  2.  Pt will improve gait velocity to at least 3.25 ft/sec for improved gait efficiency and performance at mod I level  Baseline: 1.74 ft/sec mod I (4/7), 2.64 ft/sec mod I (5/5), 3.0 ft/sec mod I (6/2), 2.0 ft/sec  mod I (7/2) Goal status: NOT MET  3.  Pt will improve FGA to 26/30 for decreased fall risk  Baseline: 17/30 (4/7), 22/30 (5/5), 23/30 (6/2), 22/30 (7/2) Goal status: NOT MET     ASSESSMENT:  CLINICAL IMPRESSION: Emphasis of skilled PT session on assessing LTG and discussing plan moving forwards regarding therapy and recovery. Pt met 0/3 LTG as she has plateaued with her progress and does not show significant improvement or change in her functional outcome measures as compared to last assessment about a month ago. Pt also does not feel that she has noticed any significant changes in her function and her progress over the past few weeks. She is agreeable to d/c at this time and continue independently with her HEP. She understands that she can obtain a new referral and return to PT in a few months if needed.    OBJECTIVE IMPAIRMENTS: Abnormal gait, cardiopulmonary status limiting activity, decreased activity tolerance, decreased balance, decreased coordination, decreased endurance, decreased knowledge of condition, decreased mobility, difficulty walking, decreased strength, impaired perceived functional ability, impaired sensation, and pain.   ACTIVITY LIMITATIONS: carrying, lifting, bending, sitting, squatting, stairs, transfers, and bed mobility  PARTICIPATION LIMITATIONS: meal prep, cleaning, laundry, occupation, and yard work  PERSONAL FACTORS: 1-2 comorbidities:   diabetes mellitus, hypertension, hyperlipidemia are also affecting patient's functional outcome.   REHAB POTENTIAL: Excellent  CLINICAL DECISION MAKING: Stable/uncomplicated  EVALUATION COMPLEXITY: Low  PLAN: discharge from PT   Waddell Southgate, PT Waddell Southgate, PT, DPT, CSRS   09/24/2023, 11:25 AM

## 2023-10-09 ENCOUNTER — Encounter: Payer: Self-pay | Admitting: Neurology

## 2023-10-10 ENCOUNTER — Encounter: Payer: Self-pay | Admitting: Advanced Practice Midwife

## 2023-10-12 ENCOUNTER — Other Ambulatory Visit: Payer: Self-pay | Admitting: Family Medicine

## 2023-10-12 DIAGNOSIS — E039 Hypothyroidism, unspecified: Secondary | ICD-10-CM

## 2023-10-12 DIAGNOSIS — E119 Type 2 diabetes mellitus without complications: Secondary | ICD-10-CM

## 2023-10-12 DIAGNOSIS — E1169 Type 2 diabetes mellitus with other specified complication: Secondary | ICD-10-CM

## 2023-10-15 ENCOUNTER — Other Ambulatory Visit (INDEPENDENT_AMBULATORY_CARE_PROVIDER_SITE_OTHER)

## 2023-10-15 DIAGNOSIS — E119 Type 2 diabetes mellitus without complications: Secondary | ICD-10-CM

## 2023-10-15 DIAGNOSIS — E1169 Type 2 diabetes mellitus with other specified complication: Secondary | ICD-10-CM | POA: Diagnosis not present

## 2023-10-15 DIAGNOSIS — E785 Hyperlipidemia, unspecified: Secondary | ICD-10-CM | POA: Diagnosis not present

## 2023-10-15 DIAGNOSIS — E039 Hypothyroidism, unspecified: Secondary | ICD-10-CM | POA: Diagnosis not present

## 2023-10-15 LAB — LIPID PANEL
Cholesterol: 181 mg/dL (ref 0–200)
HDL: 45.8 mg/dL (ref >39.00)
LDL Cholesterol: 96 mg/dL (ref 0–99)
NonHDL: 135.49
Total CHOL/HDL Ratio: 4
Triglycerides: 197 mg/dL — ABNORMAL HIGH (ref 0.0–149.0)
VLDL: 39.4 mg/dL (ref 0.0–40.0)

## 2023-10-15 LAB — T4, FREE: Free T4: 0.77 ng/dL (ref 0.60–1.60)

## 2023-10-15 LAB — COMPREHENSIVE METABOLIC PANEL WITH GFR
ALT: 28 U/L (ref 0–35)
AST: 18 U/L (ref 0–37)
Albumin: 4.6 g/dL (ref 3.5–5.2)
Alkaline Phosphatase: 90 U/L (ref 39–117)
BUN: 13 mg/dL (ref 6–23)
CO2: 29 meq/L (ref 19–32)
Calcium: 9.7 mg/dL (ref 8.4–10.5)
Chloride: 103 meq/L (ref 96–112)
Creatinine, Ser: 0.63 mg/dL (ref 0.40–1.20)
GFR: 102.01 mL/min (ref >60.00)
Glucose, Bld: 110 mg/dL — ABNORMAL HIGH (ref 70–99)
Potassium: 4.3 meq/L (ref 3.5–5.1)
Sodium: 140 meq/L (ref 135–145)
Total Bilirubin: 1.3 mg/dL — ABNORMAL HIGH (ref 0.2–1.2)
Total Protein: 7.8 g/dL (ref 6.0–8.3)

## 2023-10-15 LAB — MICROALBUMIN / CREATININE URINE RATIO
Creatinine,U: 54.6 mg/dL
Microalb Creat Ratio: 38.2 mg/g — ABNORMAL HIGH (ref 0.0–30.0)
Microalb, Ur: 2.1 mg/dL — ABNORMAL HIGH (ref 0.0–1.9)

## 2023-10-15 LAB — TSH: TSH: 1.86 u[IU]/mL (ref 0.35–5.50)

## 2023-10-15 LAB — VITAMIN B12: Vitamin B-12: 533 pg/mL (ref 211–911)

## 2023-10-15 LAB — HEMOGLOBIN A1C: Hgb A1c MFr Bld: 6.9 % — ABNORMAL HIGH (ref 4.6–6.5)

## 2023-10-16 ENCOUNTER — Other Ambulatory Visit: Payer: Self-pay | Admitting: Neurology

## 2023-10-16 ENCOUNTER — Ambulatory Visit: Payer: Self-pay | Admitting: Family Medicine

## 2023-10-16 NOTE — Telephone Encounter (Signed)
 Message sent to patient to clarify.

## 2023-10-22 ENCOUNTER — Ambulatory Visit (INDEPENDENT_AMBULATORY_CARE_PROVIDER_SITE_OTHER): Admitting: Family Medicine

## 2023-10-22 ENCOUNTER — Encounter: Payer: Self-pay | Admitting: Family Medicine

## 2023-10-22 VITALS — BP 130/88 | HR 79 | Temp 98.4°F | Ht 62.0 in | Wt 140.1 lb

## 2023-10-22 DIAGNOSIS — I10 Essential (primary) hypertension: Secondary | ICD-10-CM

## 2023-10-22 DIAGNOSIS — E119 Type 2 diabetes mellitus without complications: Secondary | ICD-10-CM

## 2023-10-22 DIAGNOSIS — G610 Guillain-Barre syndrome: Secondary | ICD-10-CM | POA: Diagnosis not present

## 2023-10-22 DIAGNOSIS — Z0001 Encounter for general adult medical examination with abnormal findings: Secondary | ICD-10-CM

## 2023-10-22 DIAGNOSIS — E785 Hyperlipidemia, unspecified: Secondary | ICD-10-CM

## 2023-10-22 DIAGNOSIS — Z8673 Personal history of transient ischemic attack (TIA), and cerebral infarction without residual deficits: Secondary | ICD-10-CM

## 2023-10-22 DIAGNOSIS — Z1211 Encounter for screening for malignant neoplasm of colon: Secondary | ICD-10-CM

## 2023-10-22 DIAGNOSIS — E1169 Type 2 diabetes mellitus with other specified complication: Secondary | ICD-10-CM

## 2023-10-22 DIAGNOSIS — E039 Hypothyroidism, unspecified: Secondary | ICD-10-CM

## 2023-10-22 DIAGNOSIS — Z7984 Long term (current) use of oral hypoglycemic drugs: Secondary | ICD-10-CM

## 2023-10-22 MED ORDER — LOSARTAN POTASSIUM 25 MG PO TABS: 25.0000 mg | ORAL_TABLET | Freq: Every day | ORAL | 3 refills | Status: DC

## 2023-10-22 NOTE — Assessment & Plan Note (Signed)
 Preventative protocols reviewed and updated unless pt declined. Discussed healthy diet and lifestyle.

## 2023-10-22 NOTE — Patient Instructions (Addendum)
 Pass by lab to pick up stool kit.  Call to schedule mammogram at your convenience: [facility] [phone].  Ok to stay off metformin  for now. Stop amlodipine , in its place start losartan  25mg  daily sent to pharmacy.  Return in 1-2 weeks for lab visit only  We will refer you to diabetes classes.  Good to see you today  Return in 4-6 months for diabetes follow up visit

## 2023-10-22 NOTE — Progress Notes (Unsigned)
 Ph: [phone] Fax: [phone]   Patient ID: Becky Shaw, female    DOB: 07/30/1970, 53 y.o.   MRN: [id number]  This visit was conducted in person.  BP 130/88   Pulse 79   Temp 98.4 F (36.9 C) (Oral)   Ht 5' 2 (1.575 m)   Wt 140 lb 2 oz (63.6 kg)   SpO2 99%   BMI 25.63 kg/m   BP Readings from Last 3 Encounters:  10/22/23 130/88  09/23/23 (!) 132/90  06/13/23 116/82   CC: CPE Subjective:   HPI: Torra Pala is a 53 y.o. female presenting on 10/22/2023 for Annual Exam   Requests handicap placard application renewal.   GBS 03/2023 treated with IVIG with significant improvement but with persistent mild distal weakness, gait unsteadiness. She continues to feel paresthesias of fingers and toes, shock feeling to lower legs,  as well as sacral numbness with sitting. Followed by neurology Dr Tobie. Most recently started duloxetine  30mg  nightly to replace gabapentin  due to weight gain - caused headaches /dizziness so stopped this. Restarted gabapentin  600mg  at night PRN. Rec continue PT - however outpatient PT ended - last session was 09/24/2023.  Remains out of work due to difficulty with tingling. Working with attorney on SSDI application.  DM - off metformin  XR 500mg  daily.   Preventative: Colon cancer screening - yearly iFOB Well woman exam - always normal, latest normal pap 06/2020 - discussed q3-5 yrs - rpt next year Mammogram 09/2021 - returned for diagnostic mammo/US  bilateral - Birads2, rpt 1 yr - # provided to reschedule  Lung cancer screening - not eligible  DEXA scan - not due Flu shot - declined COVID shot - Moderna x3 Td 2011, Tdap 08/2022 Pneumonia shot - discussed  Shingrix - discussed Advanced directive discussion -  Seat belt use discussed Sunscreen use discussed. No changing moles on skin. 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 Interim medical history since our last visit reviewed. Allergies and medications reviewed and updated. Outpatient Medications Prior to Visit  Medication Sig Dispense Refill   aspirin  EC 81 MG tablet Take 1 tablet (81 mg total) by mouth daily. Swallow whole.     gabapentin  (NEURONTIN ) 300 MG capsule Take 1-2 capsules (300-600 mg total) by mouth at bedtime.     amLODipine  (NORVASC ) 2.5 MG tablet Take 1 tablet (2.5 mg total) by mouth daily. 90 tablet 4   atorvastatin  (LIPITOR) 10 MG tablet Take 1 tablet (10 mg total) by mouth every Monday, Wednesday, and Friday.     DULoxetine  (CYMBALTA ) 30 MG capsule Take 1 capsule (30 mg total) by mouth daily. 30 capsule 3   gabapentin  (NEURONTIN ) 100 MG capsule Take 100 mg by mouth 3 (three) times daily.     metFORMIN  (GLUCOPHAGE -XR) 500 MG 24 hr tablet Take 1 tablet (500 mg total) by mouth daily with breakfast. (Patient not taking: Reported on 10/22/2023) 90 tablet 1   No facility-administered medications prior to visit.     Per HPI unless specifically indicated in ROS section  below Review of Systems  Constitutional:  Negative for activity change, appetite change, chills, fatigue, fever and unexpected weight change.  HENT:  Negative for hearing loss.   Eyes:  Negative for visual disturbance.  Respiratory:  Negative for cough, chest tightness, shortness of breath and wheezing.   Cardiovascular:  Negative for chest pain, palpitations and leg swelling.  Gastrointestinal:  Negative for abdominal distention, abdominal pain, blood in stool, constipation, diarrhea, nausea and vomiting.  Genitourinary:  Negative for difficulty urinating and  hematuria.  Musculoskeletal:  Positive for back pain. Negative for arthralgias, myalgias and neck pain.  Skin:  Negative for rash.  Neurological:  Positive for numbness and headaches (with duloxetine ). Negative for dizziness, seizures and syncope.  Hematological:  Negative for adenopathy. Does not bruise/bleed easily.  Psychiatric/Behavioral:  Negative for dysphoric mood. The patient is not nervous/anxious.     Objective:  BP 130/88   Pulse 79   Temp 98.4 F (36.9 C) (Oral)   Ht 5' 2 (1.575 m)   Wt 140 lb 2 oz (63.6 kg)   SpO2 99%   BMI 25.63 kg/m   Wt Readings from Last 3 Encounters:  10/22/23 140 lb 2 oz (63.6 kg)  09/23/23 143 lb (64.9 kg)  06/13/23 132 lb 6 oz (60 kg)      Physical Exam Vitals and nursing note reviewed.  Constitutional:      Appearance: Normal appearance. She is not ill-appearing.  HENT:     Head: Normocephalic and atraumatic.     Right Ear: Tympanic membrane, ear canal and external ear normal. There is no impacted cerumen.     Left Ear: Tympanic membrane, ear canal and external ear normal. There is no impacted cerumen.     Mouth/Throat:     Mouth: Mucous membranes are moist.     Pharynx: Oropharynx is clear. No oropharyngeal exudate or posterior oropharyngeal erythema.  Eyes:     General:        Right eye: No discharge.        Left eye: No discharge.     Extraocular Movements: Extraocular movements intact.     Conjunctiva/sclera: Conjunctivae normal.     Pupils: Pupils are equal, round, and reactive to light.  Neck:     Thyroid : No thyroid  mass or thyromegaly.  Cardiovascular:     Rate and Rhythm: Normal rate and regular rhythm.     Pulses: Normal pulses.     Heart sounds: Normal heart sounds. No murmur heard. Pulmonary:     Effort: Pulmonary effort is normal. No respiratory distress.     Breath sounds: Normal breath sounds. No wheezing, rhonchi or rales.  Abdominal:     General: Bowel sounds are normal. There is no distension.      Palpations: Abdomen is soft. There is no mass.     Tenderness: There is no abdominal tenderness. There is no guarding or rebound.     Hernia: No hernia is present.  Musculoskeletal:     Cervical back: Normal range of motion and neck supple. No rigidity.     Right lower leg: No edema.     Left lower leg: No edema.  Lymphadenopathy:     Cervical: No cervical adenopathy.  Skin:    General: Skin is warm and dry.     Findings: No rash.  Neurological:     General: No focal deficit present.     Mental Status: She is alert. Mental status is at baseline.  Psychiatric:  Mood and Affect: Mood normal.        Behavior: Behavior normal.       Results for orders placed or performed in visit on 10/15/23  T4, free   Collection Time: 10/15/23  8:29 AM  Result Value Ref Range   Free T4 0.77 0.60 - 1.60 ng/dL  TSH   Collection Time: 10/15/23  8:29 AM  Result Value Ref Range   TSH 1.86 0.35 - 5.50 uIU/mL  Vitamin B12   Collection Time: 10/15/23  8:29 AM  Result Value Ref Range   Vitamin B-12 533 211 - 911 pg/mL  Microalbumin / creatinine urine ratio   Collection Time: 10/15/23  8:29 AM  Result Value Ref Range   Microalb, Ur 2.1 (H) 0.0 - 1.9 mg/dL   Creatinine,U 45.3 mg/dL   Microalb Creat Ratio 38.2 (H) 0.0 - 30.0 mg/g  Hemoglobin A1c   Collection Time: 10/15/23  8:29 AM  Result Value Ref Range   Hgb A1c MFr Bld 6.9 (H) 4.6 - 6.5 %  Comprehensive metabolic panel with GFR   Collection Time: 10/15/23  8:29 AM  Result Value Ref Range   Sodium 140 135 - 145 mEq/L   Potassium 4.3 3.5 - 5.1 mEq/L   Chloride 103 96 - 112 mEq/L   CO2 29 19 - 32 mEq/L   Glucose, Bld 110 (H) 70 - 99 mg/dL   BUN 13 6 - 23 mg/dL   Creatinine, Ser 9.36 0.40 - 1.20 mg/dL   Total Bilirubin 1.3 (H) 0.2 - 1.2 mg/dL   Alkaline Phosphatase 90 39 - 117 U/L   AST 18 0 - 37 U/L   ALT 28 0 - 35 U/L   Total Protein 7.8 6.0 - 8.3 g/dL   Albumin 4.6 3.5 - 5.2 g/dL   GFR 897.98 >39.99 mL/min   Calcium  9.7 8.4 -  10.5 mg/dL  Lipid panel   Collection Time: 10/15/23  8:29 AM  Result Value Ref Range   Cholesterol 181 0 - 200 mg/dL   Triglycerides 802.9 (H) 0.0 - 149.0 mg/dL   HDL 54.19 >60.99 mg/dL   VLDL 60.5 0.0 - 59.9 mg/dL   LDL Cholesterol 96 0 - 99 mg/dL   Total CHOL/HDL Ratio 4    NonHDL 135.49     Assessment & Plan:   Problem List Items Addressed This Visit     Encounter for general adult medical examination with abnormal findings - Primary (Chronic)   Preventative protocols reviewed and updated unless pt declined. Discussed healthy diet and lifestyle.       Essential hypertension   Chronic, stable. Due to microalbuminuria noted and DM hx, will stop amlodipine  and in its place start losartan  25mg  daily, recheck Cr in 1-2 wks      Relevant Medications   losartan  (COZAAR ) 25 MG tablet   atorvastatin  (LIPITOR) 10 MG tablet (Start on 10/24/2023)   Other Relevant Orders   Basic metabolic panel with GFR   Hyperlipidemia associated with type 2 diabetes mellitus (HCC)   Continue atorvastatin  10mg  MWF. Latest LDL 96.  Minimal goal LDL <100 in diabetic, ideally <70 in h/o stroke.  Markedly high Lp(a) levels.  The 10-year ASCVD risk score (Arnett DK, et al., 2019) is: 4.3%   Values used to calculate the score:     Age: 45 years     Clincally relevant sex: Female     Is Non-Hispanic African American: No     Diabetic: Yes     Tobacco smoker: No  Systolic Blood Pressure: 130 mmHg     Is BP treated: Yes     HDL Cholesterol: 45.8 mg/dL     Total Cholesterol: 181 mg/dL       Relevant Medications   losartan  (COZAAR ) 25 MG tablet   atorvastatin  (LIPITOR) 10 MG tablet (Start on 10/24/2023)   Other Relevant Orders   Ambulatory referral to diabetic education   Type 2 diabetes mellitus without complication, without long-term current use of insulin  (HCC)   Chronic, improved off metformin  - will remain off.  Will refer to diabetes education.  RTC 4-6 mo DM f/u visit       Relevant  Medications   losartan  (COZAAR ) 25 MG tablet   atorvastatin  (LIPITOR) 10 MG tablet (Start on 10/24/2023)   Other Relevant Orders   Ambulatory referral to diabetic education   Borderline hypothyroidism   TFTs stable, latest TSH 1.8.       History of stroke in adulthood   Incidentally noted on brain MRI 03/2023 - subcentimeter remote lacunar infarct at the posterior aspect of the right lentiform nucleus. Continue aspirin , atorvastatin .      GBS (Guillain Barre syndrome) (HCC)   Appreciate neurology care - continue gabapentin  600mg  nightly  Did not tolerate cymbalta  - headaches/dizziness.  Handicap placard application renewed for 6 months      Relevant Medications   gabapentin  (NEURONTIN ) 300 MG capsule   Other Visit Diagnoses       Special screening for malignant neoplasms, colon       Relevant Orders   Fecal occult blood, imunochemical        Meds ordered this encounter  Medications   losartan  (COZAAR ) 25 MG tablet    Sig: Take 1 tablet (25 mg total) by mouth daily.    Dispense:  90 tablet    Refill:  3    To replace amlodipine    atorvastatin  (LIPITOR) 10 MG tablet    Sig: Take 1 tablet (10 mg total) by mouth every Monday, Wednesday, and Friday.    Dispense:  40 tablet    Refill:  3    Orders Placed This Encounter  Procedures   Fecal occult blood, imunochemical    Standing Status:   Future    Number of Occurrences:   1    Expiration Date:   10/21/2024   Basic metabolic panel with GFR    Standing Status:   Future    Expiration Date:   10/21/2024   Ambulatory referral to diabetic education    Referral Priority:   Routine    Referral Type:   Consultation    Referral Reason:   Specialty Services Required    Number of Visits Requested:   1    Patient Instructions  Pass by lab to pick up stool kit.  Call to schedule mammogram at your convenience: [facility] [phone].  Ok to stay off metformin  for now. Stop amlodipine , in its place start  losartan  25mg  daily sent to pharmacy.  Return in 1-2 weeks for lab visit only  We will refer you to diabetes classes.  Good to see you today  Return in 4-6 months for diabetes follow up visit  Follow up plan: Return in about 6 months (around 04/23/2024) for follow up visit.  Anton Blas, MD

## 2023-10-23 ENCOUNTER — Other Ambulatory Visit (INDEPENDENT_AMBULATORY_CARE_PROVIDER_SITE_OTHER): Payer: Self-pay

## 2023-10-23 DIAGNOSIS — Z1211 Encounter for screening for malignant neoplasm of colon: Secondary | ICD-10-CM | POA: Diagnosis not present

## 2023-10-23 MED ORDER — ATORVASTATIN CALCIUM 10 MG PO TABS: 10.0000 mg | ORAL_TABLET | ORAL | 3 refills | Status: AC

## 2023-10-23 NOTE — Assessment & Plan Note (Addendum)
 Appreciate neurology care - continue gabapentin  600mg  nightly  Did not tolerate cymbalta  - headaches/dizziness.  Handicap placard application renewed for 6 months

## 2023-10-23 NOTE — Assessment & Plan Note (Signed)
 Chronic, improved off metformin  - will remain off.  Will refer to diabetes education.  RTC 4-6 mo DM f/u visit

## 2023-10-23 NOTE — Assessment & Plan Note (Addendum)
 Continue atorvastatin  10mg  MWF. Latest LDL 96.  Minimal goal LDL <100 in diabetic, ideally <70 in h/o stroke.  Markedly high Lp(a) levels.  The 10-year ASCVD risk score (Arnett DK, et al., 2019) is: 4.3%   Values used to calculate the score:     Age: 53 years     Clincally relevant sex: Female     Is Non-Hispanic African American: No     Diabetic: Yes     Tobacco smoker: No     Systolic Blood Pressure: 130 mmHg     Is BP treated: Yes     HDL Cholesterol: 45.8 mg/dL     Total Cholesterol: 181 mg/dL

## 2023-10-23 NOTE — Assessment & Plan Note (Signed)
 Chronic, stable. Due to microalbuminuria noted and DM hx, will stop amlodipine  and in its place start losartan  25mg  daily, recheck Cr in 1-2 wks

## 2023-10-23 NOTE — Assessment & Plan Note (Addendum)
 Incidentally noted on brain MRI 03/2023 - subcentimeter remote lacunar infarct at the posterior aspect of the right lentiform nucleus. Continue aspirin , atorvastatin .

## 2023-10-23 NOTE — Assessment & Plan Note (Signed)
 TFTs stable, latest TSH 1.8.

## 2023-10-24 LAB — FECAL OCCULT BLOOD, IMMUNOCHEMICAL: Fecal Occult Bld: NEGATIVE

## 2023-10-25 ENCOUNTER — Ambulatory Visit: Payer: Self-pay | Admitting: Family Medicine

## 2023-10-27 ENCOUNTER — Other Ambulatory Visit: Payer: Self-pay | Admitting: Family Medicine

## 2023-10-29 ENCOUNTER — Other Ambulatory Visit (INDEPENDENT_AMBULATORY_CARE_PROVIDER_SITE_OTHER)

## 2023-10-29 DIAGNOSIS — I10 Essential (primary) hypertension: Secondary | ICD-10-CM

## 2023-10-29 LAB — BASIC METABOLIC PANEL WITH GFR
BUN: 15 mg/dL (ref 6–23)
CO2: 30 meq/L (ref 19–32)
Calcium: 9.7 mg/dL (ref 8.4–10.5)
Chloride: 103 meq/L (ref 96–112)
Creatinine, Ser: 0.62 mg/dL (ref 0.40–1.20)
GFR: 102.37 mL/min (ref >60.00)
Glucose, Bld: 108 mg/dL — ABNORMAL HIGH (ref 70–99)
Potassium: 3.9 meq/L (ref 3.5–5.1)
Sodium: 140 meq/L (ref 135–145)

## 2023-11-03 MED ORDER — GABAPENTIN 300 MG PO CAPS: 300.0000 mg | ORAL_CAPSULE | Freq: Every day | ORAL | 1 refills | Status: DC

## 2023-11-03 NOTE — Addendum Note (Signed)
 Addended by: Emary Zalar K on: 11/03/2023 10:11 AM   Modules accepted: Orders

## 2023-11-16 ENCOUNTER — Other Ambulatory Visit: Payer: Self-pay | Admitting: Family Medicine

## 2023-12-16 ENCOUNTER — Ambulatory Visit: Payer: Self-pay

## 2023-12-16 NOTE — Telephone Encounter (Signed)
 FYI Only or Action Required?: FYI only for provider.  Patient was last seen in primary care on 10/22/2023 by Rilla Baller, MD.  Called Nurse Triage reporting Hypertension.  Symptoms began a week ago.  Interventions attempted: Prescription medications: as prescribed, no missed doses.  Symptoms are: unchanged.  Triage Disposition: See Physician Within 24 Hours  Patient/caregiver understands and will follow disposition?: Yes   Copied from CRM #[id number]. Topic: Clinical - Red Word Triage >> Dec 16, 2023  3:34 PM Taleah C wrote: Red Word that prompted transfer to Nurse Triage: high bp for 2 weeks, reading today was 167/100 and 145/102. Reason for Disposition  Systolic BP >= 180 OR Diastolic >= 110  Answer Assessment - Initial Assessment Questions Additional info: Patient has been experiencing elevated blood pressure consistently for two weeks, she has not missed any medication doses, and reports no change to her diet. She is leaving out of country soon and requesting appointment to evaluated and treat. Scheduled next available acute visit in office with alternate provider, pcp has no appointments until October.    1. BLOOD PRESSURE: What is your blood pressure? Did you take at least two measurements 5 minutes apart?     167/100, 145/102 2. ONSET: When did you take your blood pressure?     today 3. HOW: How did you take your blood pressure? (e.g., automatic home BP monitor, visiting nurse)     Home monitor 4. HISTORY: Do you have a history of high blood pressure?     yes 5. MEDICINES: Are you taking any medicines for blood pressure? Have you missed any doses recently?     As prescribed  6. OTHER SYMPTOMS: Do you have any symptoms? (e.g., blurred vision, chest pain, difficulty breathing, headache, weakness)     Denies. No changes to diet.  Protocols used: Blood Pressure - High-A-AH

## 2023-12-16 NOTE — Telephone Encounter (Signed)
 Appreciate Becky Shaw seeing this nice patient. Anticiapte will need losartan  dose titration.

## 2023-12-17 ENCOUNTER — Ambulatory Visit: Admitting: General Practice

## 2023-12-17 ENCOUNTER — Encounter: Payer: Self-pay | Admitting: General Practice

## 2023-12-17 VITALS — BP 128/100 | HR 75 | Temp 97.7°F | Ht 62.0 in | Wt 145.0 lb

## 2023-12-17 DIAGNOSIS — I10 Essential (primary) hypertension: Secondary | ICD-10-CM | POA: Diagnosis not present

## 2023-12-17 MED ORDER — LOSARTAN POTASSIUM 25 MG PO TABS: 50.0000 mg | ORAL_TABLET | Freq: Every day | ORAL | Status: DC

## 2023-12-17 NOTE — Patient Instructions (Addendum)
 Increase losartan  to 50 mg once daily. You have the 25 mg tablets at home, so please take 2 tablets daily.   Start monitoring your blood pressure daily, around the same time of day, for the next 2 weeks.  Ensure that you have rested for 30 minutes prior to checking your blood pressure.   Record your readings and send it via mychart or drop it off.   F/u 4 weeks with PCP.   It was a pleasure meeting you!

## 2023-12-17 NOTE — Assessment & Plan Note (Signed)
 Slightly uncontrolled.  Elevated on blood readings in the office today.  Neuro exam stable. No red flags.   Increase Losartan  to 50 mg once daily. She still has a lot of the 25 mg tablets at home. Asked patient to take two tablets daily.  Patient is asked to monitor BP at home or work, several times per month and return with written values in two weeks.   F/u in office in 4 weeks.

## 2023-12-17 NOTE — Progress Notes (Signed)
 Established Patient Office Visit  Subjective   Patient ID: Becky Shaw, female    DOB: [birth year]  Age: 53 y.o. MRN: [id number]  Chief Complaint  Patient presents with   Hypertension    Patients BP has been elevated; currently taking losartan  25 mg tabs daily. Dr. KANDICE changed from amlodipine  last month. Patient hadn't really been checking he BP much after med change until recently and noticed it was high.     Hypertension Pertinent negatives include no blurred vision, chest pain, headaches or shortness of breath.    Becky Shaw is a 53 year old female, patient of Anton Blas MD, with past medical history of HTN, HLD, type 2 DM, presents today for an acute visit.   HTN: diagnosed many years ago. She has been previously managed on Amlodipine  5 mg once daily but was switched to Losartan  25 mg once daily on 10/22/23 for kidney protection.   She has been experiencing lightheadedness yesterday, which is when she checked her BP at home. The home BP readings have been in the mid 145-160's / 100's range yesterday and today. She has been taking Losartan  25 mg once daily without missing any doses.   She denies any blurred vision, chest pain, shortness of breath, headaches or unilateral weakness.   Patient Active Problem List   Diagnosis Date Noted   GBS (Guillain Barre syndrome) 04/19/2023   Thyroid  nodule 04/17/2023   Postmenopausal 04/17/2023   History of stroke in adulthood 04/16/2023   Disturbance of skin sensation 04/16/2023   Elevated blood protein 04/16/2023   Travel advice encounter 09/16/2022   Menopausal state 09/16/2022   Borderline hypothyroidism 03/18/2022   Family history of stroke (cerebrovascular) 03/08/2022   Type 2 diabetes mellitus without complication, without long-term current use of insulin  (HCC) 12/07/2021   Dermatitis of both ear canals 08/29/2021   Hyperlipidemia associated with type 2 diabetes mellitus (HCC) 06/26/2020   Essential hypertension 03/22/2015    Encounter for general adult medical examination with abnormal findings 12/07/2010   Allergic rhinitis 05/17/2007   Past Medical History:  Diagnosis Date   ALLERGIC RHINITIS 05/17/2007   GANGLION CYST, WRIST, RIGHT 05/31/2008   Hyperlipidemia    OVERACTIVE BLADDER 05/15/2007   Prediabetes    Past Surgical History:  Procedure Laterality Date   CHOLECYSTECTOMY           12/17/2023    9:44 AM 10/22/2023   11:32 AM 06/13/2023   11:25 AM  Depression screen PHQ 2/9  Decreased Interest 1 1 0  Down, Depressed, Hopeless 1 1 1   PHQ - 2 Score 2 2 1   Altered sleeping 2 1 1   Tired, decreased energy 0 1 1  Change in appetite 0 1 0  Feeling bad or failure about yourself  0 0 0  Trouble concentrating 0 0 0  Moving slowly or fidgety/restless 2 2 0  Suicidal thoughts 0 0 0  PHQ-9 Score 6 7 3   Difficult doing work/chores Not difficult at all Somewhat difficult Not difficult at all       12/17/2023    9:45 AM 10/22/2023   11:32 AM 06/13/2023   11:26 AM 05/02/2023   11:34 AM  GAD 7 : Generalized Anxiety Score  Nervous, Anxious, on Edge 0 0 0 0  Control/stop worrying 2 1 0 0  Worry too much - different things 2 1 0 0  Trouble relaxing 2 1 1  0  Restless 1 1 1  0  Easily annoyed or irritable 3 1 0 0  Afraid - awful might happen 0 0 0 0  Total GAD 7 Score 10 5 2  0  Anxiety Difficulty Somewhat difficult Not difficult at all        Review of Systems  Constitutional:  Negative for chills and fever.  Eyes:  Negative for blurred vision.  Respiratory:  Negative for shortness of breath.   Cardiovascular:  Negative for chest pain.  Gastrointestinal:  Negative for abdominal pain, constipation, diarrhea, heartburn, nausea and vomiting.  Genitourinary:  Negative for dysuria, frequency and urgency.  Neurological:  Negative for dizziness and headaches.       Lightheadedness  Endo/Heme/Allergies:  Negative for polydipsia.  Psychiatric/Behavioral:  Negative for depression and suicidal ideas. The  patient is not nervous/anxious.       Objective:     BP (!) 128/100 (BP Location: Left Arm, Cuff Size: Normal)   Pulse 75   Temp 97.7 F (36.5 C) (Temporal)   Ht 5' 2 (1.575 m)   Wt 145 lb (65.8 kg)   SpO2 97%   BMI 26.52 kg/m  BP Readings from Last 3 Encounters:  12/17/23 (!) 128/100  10/22/23 130/88  09/23/23 (!) 132/90   Wt Readings from Last 3 Encounters:  12/17/23 145 lb (65.8 kg)  10/22/23 140 lb 2 oz (63.6 kg)  09/23/23 143 lb (64.9 kg)      Physical Exam Vitals and nursing note reviewed.  Constitutional:      Appearance: Normal appearance.  Cardiovascular:     Rate and Rhythm: Normal rate and regular rhythm.     Pulses: Normal pulses.     Heart sounds: Normal heart sounds.  Pulmonary:     Effort: Pulmonary effort is normal.     Breath sounds: Normal breath sounds.  Skin:    General: Skin is warm.  Neurological:     General: No focal deficit present.     Mental Status: She is alert and oriented to person, place, and time.     Cranial Nerves: Cranial nerves 2-12 are intact.     Motor: Motor function is intact.     Coordination: Coordination is intact.     Gait: Gait is intact.  Psychiatric:        Mood and Affect: Mood normal.        Behavior: Behavior normal.        Thought Content: Thought content normal.        Judgment: Judgment normal.      No results found for any visits on 12/17/23.     The 10-year ASCVD risk score (Arnett DK, et al., 2019) is: 4.2%    Assessment & Plan:  Essential hypertension Assessment & Plan: Slightly uncontrolled.  Elevated on blood readings in the office today.  Neuro exam stable. No red flags.   Increase Losartan  to 50 mg once daily. She still has a lot of the 25 mg tablets at home. Asked patient to take two tablets daily.  Patient is asked to monitor BP at home or work, several times per month and return with written values in two weeks.   F/u in office in 4 weeks.  Orders: -     Losartan  Potassium;  Take 2 tablets (50 mg total) by mouth daily.     Return in about 4 weeks (around 01/14/2024) for HTN.    Carrol Aurora, NP

## 2023-12-22 ENCOUNTER — Encounter: Payer: Self-pay | Admitting: Family Medicine

## 2023-12-22 ENCOUNTER — Ambulatory Visit: Payer: Self-pay

## 2023-12-22 DIAGNOSIS — I10 Essential (primary) hypertension: Secondary | ICD-10-CM

## 2023-12-22 NOTE — Telephone Encounter (Signed)
 This RN attempted to contact patient for triage. No answer, voicemail left requesting return call to clinic.   Copied from CRM #[id number]. Topic: Clinical - Red Word Triage >> Dec 22, 2023  9:12 AM Becky Shaw wrote: [facility] that prompted transfer to Nurse Triage: Pt states that she came in to the clinic last week due to experiencing high bp and was given amLODIPine  Besylate 2.5 mg Oral Daily to treat and a chart to track her bp, pt states that new med has not improved her bp and has been increasing over the last few days, highest being this morning at 141/108.

## 2023-12-23 ENCOUNTER — Ambulatory Visit: Payer: Self-pay

## 2023-12-23 MED ORDER — AMLODIPINE BESYLATE 2.5 MG PO TABS: 2.5000 mg | ORAL_TABLET | Freq: Every day | ORAL | 1 refills | Status: DC

## 2023-12-23 NOTE — Telephone Encounter (Signed)
 Tried to call pt - straight to voicemail - will try again later.  Currently should be on losartan  25mg  2 tablets daily.  She's not taking amlodipine  that I'm aware of.

## 2023-12-23 NOTE — Telephone Encounter (Signed)
 Change losartan  to 25mg  bid Restart amlodipine  2.5mg  daily.

## 2023-12-23 NOTE — Telephone Encounter (Signed)
 Tried to call pt twice, unable to reach her. Sent message via mychart.

## 2023-12-23 NOTE — Telephone Encounter (Signed)
 Patient calling to report high blood pressure readings. Endorses increased stress due to recent death of father. Patient was increase to losartan  50 MG on 12/17/2023. Asking for any additional recommendations and if she needs to be seen sooner than 01/14/2024. Asking for a follow up call  FYI Only or Action Required?: Action required by provider: update on patient condition.  Patient was last seen in primary care on 12/17/2023 by Vincente Shivers, NP.  Called Nurse Triage reporting Hypertension.  Symptoms began several days ago.  Interventions attempted: Prescription medications: losartan  and Rest, hydration, or home remedies.  Symptoms are: unchanged.  Triage Disposition: See PCP When Office is Open (Within 3 Days)  Patient/caregiver understands and will follow disposition?: No, wishes to speak with PCP  Copied from CRM #[id number]. Topic: Clinical - Red Word Triage >> Dec 23, 2023  8:32 AM Becky Shaw wrote: Red Word that prompted transfer to Nurse Triage: Patient called in to return called to [facility] regarding High BP symptoms Reason for Disposition  Systolic BP >= 160 OR Diastolic >= 100  Answer Assessment - Initial Assessment Questions Patient called back today with concerns about blood pressure. Patient reports having increases in stress due to recent death of father. Patient educated on blood pressure medication and what to watch for. Patient is encouraged to call back with any other concerns. Patient is due for an appointment with provider in a couple of weeks. Patient would like recommendations from provider if she is needed to be seen sooner. Patient is asking for a call back.   1. BLOOD PRESSURE: What is your blood pressure? Did you take at least two measurements 5 minutes apart?     157/108  137/97 (today)   141/108 (yesterday)  147/105 (while on the phone with RN) 2. ONSET: When did you take your blood pressure?     Has been taking her blood pressure every morning 3. HOW: How  did you take your blood pressure? (e.g., automatic home BP monitor, visiting nurse)     Automatic home BP 4. HISTORY: Do you have a history of high blood pressure?     yes 5. MEDICINES: Are you taking any medicines for blood pressure? Have you missed any doses recently?     losartan  6. OTHER SYMPTOMS: Do you have any symptoms? (e.g., blurred vision, chest pain, difficulty breathing, headache, weakness)     Light headache in the morning-4 out of 10 in pain-headache goes away after taking blood pressure medication.  Protocols used: Blood Pressure - High-A-AH

## 2023-12-29 NOTE — Progress Notes (Signed)
 Diabetes Self-Management Education  Visit Type: First/Initial  Appt. Start Time: 0855 Appt. End Time: 0955  01/05/2024  [name], identified by name and date of birth, is a 53 y.o. female with a diagnosis of Diabetes: Type 2.   ASSESSMENT  Patient is here today alone. Patient would like to learn what to eat for diabetes. Patient lives with her husband and her does shopping and cooking.  Pt reports she is out of work at this time. Pt reports she notices poor sleep created rises in fasting blood sugar upon waking. Pt  reports recent changes include selecting brown rice versus white rice and and walking for the past two months. Pt reports her father passed away last month and Pt denies thoughts of hurting herself. All Pt's questions were answered during this encounter.   History includes:   Past Medical History:  Diagnosis Date   ALLERGIC RHINITIS 05/17/2007   GANGLION CYST, WRIST, RIGHT 05/31/2008   Hyperlipidemia    OVERACTIVE BLADDER 05/15/2007   Prediabetes     Medications include:   Current Outpatient Medications:    amLODipine  (NORVASC ) 5 MG tablet, Take 1 tablet (5 mg total) by mouth daily., Disp: 30 tablet, Rfl: 1   aspirin  EC 81 MG tablet, Take 1 tablet (81 mg total) by mouth daily. Swallow whole., Disp: , Rfl:    atorvastatin  (LIPITOR) 10 MG tablet, Take 1 tablet (10 mg total) by mouth every Monday, Wednesday, and Friday., Disp: 40 tablet, Rfl: 3   gabapentin  (NEURONTIN ) 300 MG capsule, Take 1 capsule (300 mg total) by mouth at bedtime., Disp: 90 capsule, Rfl: 1   losartan  (COZAAR ) 50 MG tablet, Take 1 tablet (50 mg total) by mouth in the morning and at bedtime., Disp: 60 tablet, Rfl: 1   Labs noted:   Lab Results  Component Value Date   HGBA1C 6.9 (H) 10/15/2023   Lab Results  Component Value Date   CHOL 181 10/15/2023   HDL 45.80 10/15/2023   LDLCALC 96 10/15/2023   TRIG 197.0 (H) 10/15/2023   CHOLHDL 4 10/15/2023   Wt Readings from Last 3 Encounters:   01/05/24 142 lb 12.8 oz (64.8 kg)  12/17/23 145 lb (65.8 kg)  10/22/23 140 lb 2 oz (63.6 kg)   Weight 142 lb 12.8 oz (64.8 kg). Body mass index is 26.12 kg/m.  Individualized Plan for Diabetes Self-Management Training:   Learning Objective:  Patient will have a greater understanding of diabetes self-management. Patient education plan is to attend individual and/or group sessions per assessed needs and concerns.  Plan:   Patient Instructions  Aim for balanced meals and snacks; avoid skipping meals and/or snacks  Expected Outcomes:  Demonstrated interest in learning. Expect positive outcomes  Education material provided: ADA - How to Thrive: A Guide for Your Journey with Diabetes, My Plate, Snack sheet, and Diabetes Resources, Building a heart Healthy Plate  If problems or questions, patient to contact team via:  Phone  Future DSME appointment: 3-4 months

## 2023-12-31 ENCOUNTER — Telehealth: Payer: Self-pay | Admitting: Family Medicine

## 2023-12-31 NOTE — Telephone Encounter (Signed)
Placed BP log in Dr. G's box.  

## 2023-12-31 NOTE — Telephone Encounter (Signed)
 Patient came by and dropped off her bp chart.

## 2024-01-01 MED ORDER — AMLODIPINE BESYLATE 5 MG PO TABS
5.0000 mg | ORAL_TABLET | Freq: Every day | ORAL | 1 refills | Status: DC
Start: 2024-01-01 — End: 2024-01-14

## 2024-01-01 MED ORDER — LOSARTAN POTASSIUM 50 MG PO TABS: 50.0000 mg | ORAL_TABLET | Freq: Two times a day (BID) | ORAL | 1 refills | Status: DC

## 2024-01-01 NOTE — Telephone Encounter (Addendum)
 Replied via mychart. BPs reviewed - staying 130-140/90-100s, HR 70-80s.   Plz notify: Just to verify you're taking losartan  50mg  twice daily and amlodipine  2.5mg  daily? Your blood pressures are staying elevated despite this.  I recommend increasing amlodipine  to 5mg  daily in addition to the losartan  50mg  twice daily.  I have sent in new prescriptions for losartan  50mg  twice daily and amlodipine  5mg  daily to your pharmacy. How are you feeling overall?  Keep appointment later this month. Let me know if any questions.

## 2024-01-01 NOTE — Addendum Note (Signed)
 Addended by: RILLA BALLER on: 01/01/2024 12:50 PM   Modules accepted: Orders

## 2024-01-02 NOTE — Telephone Encounter (Signed)
 Called patient reviewed all information and repeated back to me. Will call if any questions.  She will pick up medication today.  She has reviewed my chart message and sent message back. Per patient. Feeling good overall

## 2024-01-05 ENCOUNTER — Encounter: Attending: Family Medicine | Admitting: Dietician

## 2024-01-05 VITALS — Wt 142.8 lb

## 2024-01-05 DIAGNOSIS — Z6826 Body mass index (BMI) 26.0-26.9, adult: Secondary | ICD-10-CM | POA: Diagnosis not present

## 2024-01-05 DIAGNOSIS — E785 Hyperlipidemia, unspecified: Secondary | ICD-10-CM | POA: Insufficient documentation

## 2024-01-05 DIAGNOSIS — Z713 Dietary counseling and surveillance: Secondary | ICD-10-CM | POA: Insufficient documentation

## 2024-01-05 DIAGNOSIS — E119 Type 2 diabetes mellitus without complications: Secondary | ICD-10-CM

## 2024-01-05 DIAGNOSIS — E1169 Type 2 diabetes mellitus with other specified complication: Secondary | ICD-10-CM | POA: Diagnosis present

## 2024-01-05 NOTE — Patient Instructions (Signed)
 Aim for balanced meals and snacks; avoid skipping meals and/or snacks

## 2024-01-09 NOTE — Addendum Note (Signed)
 Addended by: RILLA BALLER on: 01/09/2024 05:36 PM   Modules accepted: Orders

## 2024-01-14 ENCOUNTER — Ambulatory Visit: Admitting: Family Medicine

## 2024-01-14 ENCOUNTER — Encounter: Payer: Self-pay | Admitting: Family Medicine

## 2024-01-14 VITALS — BP 134/100 | HR 85 | Temp 98.6°F | Ht 64.0 in | Wt 143.5 lb

## 2024-01-14 DIAGNOSIS — G610 Guillain-Barre syndrome: Secondary | ICD-10-CM

## 2024-01-14 DIAGNOSIS — I10 Essential (primary) hypertension: Secondary | ICD-10-CM | POA: Diagnosis not present

## 2024-01-14 DIAGNOSIS — E119 Type 2 diabetes mellitus without complications: Secondary | ICD-10-CM | POA: Diagnosis not present

## 2024-01-14 MED ORDER — GABAPENTIN 100 MG PO CAPS: 100.0000 mg | ORAL_CAPSULE | Freq: Two times a day (BID) | ORAL | 3 refills | Status: AC | PRN

## 2024-01-14 MED ORDER — LOSARTAN POTASSIUM 50 MG PO TABS: 50.0000 mg | ORAL_TABLET | Freq: Every day | ORAL | 1 refills | Status: DC

## 2024-01-14 MED ORDER — LOSARTAN POTASSIUM 100 MG PO TABS
100.0000 mg | ORAL_TABLET | Freq: Every day | ORAL | 1 refills | Status: AC
Start: 2024-01-14 — End: ?

## 2024-01-14 MED ORDER — AMLODIPINE BESYLATE 5 MG PO TABS: 5.0000 mg | ORAL_TABLET | Freq: Every day | ORAL | 1 refills | Status: AC

## 2024-01-14 NOTE — Progress Notes (Signed)
 Ph: [phone] Fax: [phone]   Patient ID: Becky Shaw, female    DOB: 05/14/1970, 53 y.o.   MRN: [id number]  This visit was conducted in person.  BP (!) 134/100 (BP Location: Right Arm, Cuff Size: Large)   Pulse 85   Temp 98.6 F (37 C) (Oral)   Ht 5' 4 (1.626 m)   Wt 143 lb 8 oz (65.1 kg)   SpO2 97%   BMI 24.63 kg/m    CC: 4 wk HTN f/u visit  Subjective:   HPI: Becky Shaw is a 53 y.o. female presenting on 01/14/2024 for Medical Management of Chronic Issues (Pt here for 4 wk HTN f/u)   HTN - Compliant with current antihypertensive regimen of losartan  50mg  daily, amlodipine  5mg  daily. Does check blood pressures at home: see below, last 2 days 122/92, 127/91. No low blood pressure readings or symptoms of dizziness/syncope. Denies vision changes, CP/tightness, SOB, leg swelling.  Headaches have improved.     DM - diet controlled. Previously on metformin  XR 500mg  daily. Is regularly checking sugars: fasting low 100s. Saw nutritionist yesterday.  Lab Results  Component Value Date   HGBA1C 6.9 (H) 10/15/2023   GBS 03/2023 treated with IVIG followed by [facility] neurology Dr Tobie. Currently on gabapentin  300mg  nightly. Cymbalta  caused headaches, weight gain. Notes ongoing paresthesias and pain to right lateral toes as well as R sole with prolonged walking/standing. Also notes ongoing tailbone pain with prolonged sitting past 5-10 minutes.   Notes some sleep maintenance insomnia.      Relevant past medical, surgical, family and social history reviewed and updated as indicated. Interim medical history since our last visit reviewed. Allergies and medications reviewed and updated. Outpatient Medications Prior to Visit  Medication Sig Dispense Refill   aspirin  EC 81 MG tablet Take 1 tablet (81 mg total) by mouth daily. Swallow whole.     atorvastatin  (LIPITOR) 10 MG tablet Take 1 tablet (10 mg total) by mouth every Monday, Wednesday, and Friday. 40 tablet 3   gabapentin   (NEURONTIN ) 300 MG capsule Take 1 capsule (300 mg total) by mouth at bedtime. 90 capsule 1   amLODipine  (NORVASC ) 5 MG tablet Take 1 tablet (5 mg total) by mouth daily. 30 tablet 1   losartan  (COZAAR ) 50 MG tablet Take 1 tablet (50 mg total) by mouth daily.     No facility-administered medications prior to visit.     Per HPI unless specifically indicated in ROS section below Review of Systems  Objective:  BP (!) 134/100 (BP Location: Right Arm, Cuff Size: Large)   Pulse 85   Temp 98.6 F (37 C) (Oral)   Ht 5' 4 (1.626 m)   Wt 143 lb 8 oz (65.1 kg)   SpO2 97%   BMI 24.63 kg/m   Wt Readings from Last 3 Encounters:  01/14/24 143 lb 8 oz (65.1 kg)  01/05/24 142 lb 12.8 oz (64.8 kg)  12/17/23 145 lb (65.8 kg)      Physical Exam Vitals and nursing note reviewed.  Constitutional:      Appearance: Normal appearance. She is not ill-appearing.  HENT:     Head: Normocephalic and atraumatic.     Mouth/Throat:     Mouth: Mucous membranes are moist.     Pharynx: Oropharynx is clear. No oropharyngeal exudate or posterior oropharyngeal erythema.  Eyes:     Extraocular Movements: Extraocular movements intact.     Conjunctiva/sclera: Conjunctivae normal.     Pupils: Pupils are equal, round, and  reactive to light.  Cardiovascular:     Rate and Rhythm: Normal rate and regular rhythm.     Pulses: Normal pulses.     Heart sounds: Normal heart sounds. No murmur heard. Pulmonary:     Effort: Pulmonary effort is normal. No respiratory distress.     Breath sounds: Normal breath sounds. No wheezing or rhonchi.  Musculoskeletal:     Cervical back: Normal range of motion and neck supple. No rigidity.     Right lower leg: No edema.     Left lower leg: No edema.  Lymphadenopathy:     Cervical: No cervical adenopathy.  Skin:    General: Skin is warm and dry.     Findings: No rash.  Neurological:     Mental Status: She is alert.  Psychiatric:        Mood and Affect: Mood normal.         Behavior: Behavior normal.       Results for orders placed or performed in visit on 10/29/23  Basic metabolic panel with GFR   Collection Time: 10/29/23  9:23 AM  Result Value Ref Range   Sodium 140 135 - 145 mEq/L   Potassium 3.9 3.5 - 5.1 mEq/L   Chloride 103 96 - 112 mEq/L   CO2 30 19 - 32 mEq/L   Glucose, Bld 108 (H) 70 - 99 mg/dL   BUN 15 6 - 23 mg/dL   Creatinine, Ser 9.37 0.40 - 1.20 mg/dL   GFR 897.62 >39.99 mL/min   Calcium  9.7 8.4 - 10.5 mg/dL   Lab Results  Component Value Date   TSH 1.86 10/15/2023    Lab Results  Component Value Date   WBC 14.1 (H) 04/20/2023   HGB 16.6 (H) 04/20/2023   HCT 45.6 04/20/2023   MCV 82.5 04/20/2023   PLT 250 04/20/2023    Lab Results  Component Value Date   BILIRUBINUR NEGATIVE 04/19/2023   PROTEINUR 30 (A) 04/19/2023   UROBILINOGEN 0.2 05/01/2016   LEUKOCYTESUR NEGATIVE 04/19/2023   Lab Results  Component Value Date   CHOL 181 10/15/2023   HDL 45.80 10/15/2023   LDLCALC 96 10/15/2023   TRIG 197.0 (H) 10/15/2023   CHOLHDL 4 10/15/2023   Assessment & Plan:   Problem List Items Addressed This Visit     Essential hypertension - Primary   BP remaining elevated despite 2 drug regimen. She already limits salt/sodium in diet. DASH diet previously provided.  Will continue losartan  titration to 100mg  daily in am, continue amlodipine  5mg  nighlty. RTC 1 wk BMP, RTC 6 wks HTN f/u visit.      Relevant Medications   amLODipine  (NORVASC ) 5 MG tablet   losartan  (COZAAR ) 100 MG tablet   Other Relevant Orders   Basic metabolic panel with GFR   Type 2 diabetes mellitus without complication, without long-term current use of insulin  (HCC)   Chronic, diet controlled. Monitoring fasting sugars which are remaining in prediabetes range. Too soon to rpt A1c - will update next visit       Relevant Medications   losartan  (COZAAR ) 100 MG tablet   GBS (Guillain Barre syndrome)   Appreciate neuro care. Keep neuro f/u 03/2024 Notes  ongoing electrical shock sensation to distal extremities intermittently at day or night.  Already on gabapentin  300mg  nightly, am dosing caused excess sedation. Will try gabapentin  100mg  capsulss for day use (100-200mg  bid prn).       Relevant Medications   gabapentin  (NEURONTIN ) 100 MG capsule  Meds ordered this encounter  Medications   gabapentin  (NEURONTIN ) 100 MG capsule    Sig: Take 1 capsule (100 mg total) by mouth 2 (two) times daily as needed (nerve pain).    Dispense:  60 capsule    Refill:  3   amLODipine  (NORVASC ) 5 MG tablet    Sig: Take 1 tablet (5 mg total) by mouth at bedtime.    Dispense:  90 tablet    Refill:  1   DISCONTD: losartan  (COZAAR ) 50 MG tablet    Sig: Take 1 tablet (50 mg total) by mouth daily.    Dispense:  90 tablet    Refill:  1   losartan  (COZAAR ) 100 MG tablet    Sig: Take 1 tablet (100 mg total) by mouth daily.    Dispense:  90 tablet    Refill:  1    Use this higher dose    Orders Placed This Encounter  Procedures   Basic metabolic panel with GFR    Standing Status:   Future    Expiration Date:   01/13/2025    Patient Instructions  BP is staying a bit too high, especially bottom number.  Increase losartan  to 100mg  in the morning and continue amlodipine  5mg  at night time.  Return for previously scheduled appointment in 6 weeks. Schedule lab visit for 7-10 days after starting new losartan  dose Ok to try low dose gabapentin  capsules 100-200mg  in the morning up to twice daily, continue 300mg  capsule at bedtime.   Follow up plan: Return if symptoms worsen or fail to improve.  Anton Blas, MD

## 2024-01-14 NOTE — Assessment & Plan Note (Addendum)
 Appreciate neuro care. Keep neuro f/u 03/2024 Notes ongoing electrical shock sensation to distal extremities intermittently at day or night.  Already on gabapentin  300mg  nightly, am dosing caused excess sedation. Will try gabapentin  100mg  capsulss for day use (100-200mg  bid prn).

## 2024-01-14 NOTE — Assessment & Plan Note (Signed)
 Chronic, diet controlled. Monitoring fasting sugars which are remaining in prediabetes range. Too soon to rpt A1c - will update next visit

## 2024-01-14 NOTE — Patient Instructions (Addendum)
 BP is staying a bit too high, especially bottom number.  Increase losartan  to 100mg  in the morning and continue amlodipine  5mg  at night time.  Return for previously scheduled appointment in 6 weeks. Schedule lab visit for 7-10 days after starting new losartan  dose Ok to try low dose gabapentin  capsules 100-200mg  in the morning up to twice daily, continue 300mg  capsule at bedtime.

## 2024-01-14 NOTE — Assessment & Plan Note (Signed)
 BP remaining elevated despite 2 drug regimen. She already limits salt/sodium in diet. DASH diet previously provided.  Will continue losartan  titration to 100mg  daily in am, continue amlodipine  5mg  nighlty. RTC 1 wk BMP, RTC 6 wks HTN f/u visit.

## 2024-01-22 ENCOUNTER — Encounter (HOSPITAL_COMMUNITY): Payer: Self-pay

## 2024-01-22 ENCOUNTER — Ambulatory Visit (HOSPITAL_COMMUNITY)
Admission: EM | Admit: 2024-01-22 | Discharge: 2024-01-22 | Disposition: A | Discharge status: Home / Self Care | Attending: Family Medicine | Admitting: Family Medicine

## 2024-01-22 DIAGNOSIS — N3001 Acute cystitis with hematuria: Secondary | ICD-10-CM | POA: Insufficient documentation

## 2024-01-22 LAB — POCT URINALYSIS DIP (MANUAL ENTRY)
Glucose, UA: NEGATIVE mg/dL
Ketones, POC UA: NEGATIVE mg/dL
Nitrite, UA: NEGATIVE
Protein Ur, POC: >=300 mg/dL — AB
Spec Grav, UA: 1.015 (ref 1.010–1.025)
Urobilinogen, UA: 1 U/dL
pH, UA: 6 (ref 5.0–8.0)

## 2024-01-22 MED ORDER — PHENAZOPYRIDINE HCL 100 MG PO TABS: 100.0000 mg | ORAL_TABLET | Freq: Three times a day (TID) | ORAL | 0 refills | Status: DC | PRN

## 2024-01-22 MED ORDER — CIPROFLOXACIN HCL 500 MG PO TABS: 500.0000 mg | ORAL_TABLET | Freq: Two times a day (BID) | ORAL | 0 refills | Status: DC

## 2024-01-22 NOTE — ED Triage Notes (Signed)
 Pt c/o blood in urine since 3pm. States now having burning on urination with urgency/frequency. Denies taken any meds.

## 2024-01-22 NOTE — Discharge Instructions (Addendum)
 There is a large amount of white blood cells in red blood cells on your urinalysis.  This is consistent with most likely a urinary tract infection causing your symptoms.  Take Cipro 500 mg--1 tablet 2 times daily for 7 days  Take Pyridium/phenazopyridine 100 mg--1 tablet 3 times daily as needed for urinary pain.  This medication usually makes the urine orange  Drink plenty of fluids  Follow-up with your primary care

## 2024-01-22 NOTE — ED Provider Notes (Signed)
 [facility]    CSN: [id number] Arrival date & time: 01/22/24  1916      History   Chief Complaint Chief Complaint  Patient presents with   Urinary Tract Infection    HPI Becky Shaw is a 53 y.o. female.    Urinary Tract Infection Here for dysuria and hematuria and urinary frequency.  This afternoon she began having blood in her urine.  Then quickly after that she began having dysuria and urinary frequency.  No fever or chills and no nausea or vomiting.  She is allergic to cetirizine  She does not have periods anymore.  Last eGFR was 102 earlier this year   Past Medical History:  Diagnosis Date   ALLERGIC RHINITIS 05/17/2007   GANGLION CYST, WRIST, RIGHT 05/31/2008   Hyperlipidemia    OVERACTIVE BLADDER 05/15/2007   Prediabetes     Patient Active Problem List   Diagnosis Date Noted   GBS (Guillain Barre syndrome) 04/19/2023   Thyroid  nodule 04/17/2023   Postmenopausal 04/17/2023   History of stroke in adulthood 04/16/2023   Disturbance of skin sensation 04/16/2023   Elevated blood protein 04/16/2023   Travel advice encounter 09/16/2022   Borderline hypothyroidism 03/18/2022   Family history of stroke (cerebrovascular) 03/08/2022   Type 2 diabetes mellitus without complication, without long-term current use of insulin  (HCC) 12/07/2021   Dermatitis of both ear canals 08/29/2021   Hyperlipidemia associated with type 2 diabetes mellitus (HCC) 06/26/2020   Essential hypertension 03/22/2015   Encounter for general adult medical examination with abnormal findings 12/07/2010   Allergic rhinitis 05/17/2007    Past Surgical History:  Procedure Laterality Date   CHOLECYSTECTOMY      OB History   No obstetric history on file.      Home Medications    Prior to Admission medications   Medication Sig Start Date End Date Taking? Authorizing Provider  ciprofloxacin (CIPRO) 500 MG tablet Take 1 tablet (500 mg total) by mouth 2 (two) times daily  for 7 days. 01/22/24 01/29/24 Yes Lawyer Washabaugh K, MD  phenazopyridine (PYRIDIUM) 100 MG tablet Take 1 tablet (100 mg total) by mouth 3 (three) times daily as needed (urinary pain). 01/22/24  Yes Vonna Sharlet POUR, MD  amLODipine  (NORVASC ) 5 MG tablet Take 1 tablet (5 mg total) by mouth at bedtime. 01/14/24   Rilla Baller, MD  aspirin  EC 81 MG tablet Take 1 tablet (81 mg total) by mouth daily. Swallow whole. 04/16/23   Rilla Baller, MD  atorvastatin  (LIPITOR) 10 MG tablet Take 1 tablet (10 mg total) by mouth every Monday, Wednesday, and Friday. 10/24/23   Rilla Baller, MD  gabapentin  (NEURONTIN ) 100 MG capsule Take 1 capsule (100 mg total) by mouth 2 (two) times daily as needed (nerve pain). 01/14/24   Rilla Baller, MD  gabapentin  (NEURONTIN ) 300 MG capsule Take 1 capsule (300 mg total) by mouth at bedtime. 11/03/23   Patel, Donika K, DO  losartan  (COZAAR ) 100 MG tablet Take 1 tablet (100 mg total) by mouth daily. 01/14/24   Rilla Baller, MD    Family History Family History  Problem Relation Age of Onset   Heart Problems Mother    Stroke Father 24   Hypertension Father    Diabetes Father    Hypertension Sister    Hypertension Brother    Stroke Brother 48   Transient ischemic attack Brother 59   Stroke Brother 59       massive   Hypertension Brother    Hypertension  Brother     Social History Social History   Tobacco Use   Smoking status: Never   Smokeless tobacco: Never  Vaping Use   Vaping status: Never Used  Substance Use Topics   Alcohol use: Not Currently   Drug use: No     Allergies   Cetirizine hcl   Review of Systems Review of Systems   Physical Exam Triage Vital Signs ED Triage Vitals [01/22/24 1951]  Encounter Vitals Group     BP (!) 151/96     Girls Systolic BP Percentile      Girls Diastolic BP Percentile      Boys Systolic BP Percentile      Boys Diastolic BP Percentile      Pulse Rate 99     Resp 18     Temp 98.2 F (36.8  C)     Temp Source Oral     SpO2 97 %     Weight      Height      Head Circumference      Peak Flow      Pain Score 0     Pain Loc      Pain Education      Exclude from Growth Chart    No data found.  Updated Vital Signs BP (!) 151/96 (BP Location: Left Arm)   Pulse 99   Temp 98.2 F (36.8 C) (Oral)   Resp 18   SpO2 97%   Visual Acuity Right Eye Distance:   Left Eye Distance:   Bilateral Distance:    Right Eye Near:   Left Eye Near:    Bilateral Near:     Physical Exam Vitals reviewed.  Constitutional:      General: She is not in acute distress.    Appearance: She is not ill-appearing, toxic-appearing or diaphoretic.  HENT:     Mouth/Throat:     Mouth: Mucous membranes are moist.  Cardiovascular:     Rate and Rhythm: Normal rate and regular rhythm.     Heart sounds: No murmur heard. Pulmonary:     Effort: Pulmonary effort is normal.     Breath sounds: Normal breath sounds.  Abdominal:     Palpations: Abdomen is soft.     Tenderness: There is no abdominal tenderness. There is no right CVA tenderness or left CVA tenderness.  Skin:    Coloration: Skin is not pale.  Neurological:     General: No focal deficit present.     Mental Status: She is alert and oriented to person, place, and time.  Psychiatric:        Behavior: Behavior normal.      UC Treatments / Results  Labs (all labs ordered are listed, but only abnormal results are displayed) Labs Reviewed  POCT URINALYSIS DIP (MANUAL ENTRY) - Abnormal; Notable for the following components:      Result Value   Color, UA red (*)    Clarity, UA turbid (*)    Bilirubin, UA small (*)    Blood, UA large (*)    Protein Ur, POC >=300 (*)    Leukocytes, UA Large (3+) (*)    All other components within normal limits  URINE CULTURE    EKG   Radiology No results found.  Procedures Procedures (including critical care time)  Medications Ordered in UC Medications - No data to display  Initial  Impression / Assessment and Plan / UC Course  I have reviewed the triage vital signs  and the nursing notes.  Pertinent labs & imaging results that were available during my care of the patient were reviewed by me and considered in my medical decision making (see chart for details).   There is a large amount of blood in the large amount of leukocytes though no nitrites.  Her urine was obviously bloody  Urine culture is sent and staff will notify her if her antibiotic needs to be changed  Cipro sent in to treat the cystitis with hematuria and Pyridium was sent in for the symptoms. Final Clinical Impressions(s) / UC Diagnoses   Final diagnoses:  Acute cystitis with hematuria     Discharge Instructions      There is a large amount of white blood cells in red blood cells on your urinalysis.  This is consistent with most likely a urinary tract infection causing your symptoms.  Take Cipro 500 mg--1 tablet 2 times daily for 7 days  Take Pyridium/phenazopyridine 100 mg--1 tablet 3 times daily as needed for urinary pain.  This medication usually makes the urine orange  Drink plenty of fluids  Follow-up with your primary care     ED Prescriptions     Medication Sig Dispense Auth. Provider   ciprofloxacin (CIPRO) 500 MG tablet Take 1 tablet (500 mg total) by mouth 2 (two) times daily for 7 days. 14 tablet Deantae Shackleton K, MD   phenazopyridine (PYRIDIUM) 100 MG tablet Take 1 tablet (100 mg total) by mouth 3 (three) times daily as needed (urinary pain). 10 tablet Vonna Eliya Bubar K, MD      PDMP not reviewed this encounter.   Vonna Sharlet POUR, MD 01/22/24 [phone]

## 2024-01-23 LAB — URINE CULTURE: Culture: NO GROWTH

## 2024-01-26 ENCOUNTER — Ambulatory Visit: Payer: Self-pay

## 2024-01-26 ENCOUNTER — Other Ambulatory Visit (INDEPENDENT_AMBULATORY_CARE_PROVIDER_SITE_OTHER)

## 2024-01-26 ENCOUNTER — Ambulatory Visit (HOSPITAL_COMMUNITY): Payer: Self-pay

## 2024-01-26 DIAGNOSIS — I10 Essential (primary) hypertension: Secondary | ICD-10-CM | POA: Diagnosis not present

## 2024-01-26 LAB — BASIC METABOLIC PANEL WITH GFR
BUN: 19 mg/dL (ref 6–23)
CO2: 28 meq/L (ref 19–32)
Calcium: 9.3 mg/dL (ref 8.4–10.5)
Chloride: 104 meq/L (ref 96–112)
Creatinine, Ser: 0.74 mg/dL (ref 0.40–1.20)
GFR: 92.85 mL/min (ref >60.00)
Glucose, Bld: 133 mg/dL — ABNORMAL HIGH (ref 70–99)
Potassium: 4 meq/L (ref 3.5–5.1)
Sodium: 140 meq/L (ref 135–145)

## 2024-01-26 NOTE — Telephone Encounter (Signed)
 FYI Only or Action Required?: FYI only for provider: appointment scheduled on 01/28/2024.  Patient was last seen in primary care on 01/14/2024 by Rilla Baller, MD.  Called Nurse Triage reporting Hematuria.  Symptoms began Thursday.  Interventions attempted: Other: went to urgent care: received UTI: then called today stated no UTI & to stop medications.  Symptoms are: stable.  Triage Disposition: see PCP within 3 days  Patient/caregiver understands and will follow disposition?: Yes     Copied from CRM #[id number]. Topic: Clinical - Red Word Triage >> Jan 26, 2024  1:36 PM Ashley R wrote: Red Word that prompted transfer to Nurse Triage: Blood in urine, urgent care last week, not UTI Reason for Disposition  Pain or burning with passing urine    Next available in office 01/28/2024: pt currently not having any s/s  Answer Assessment - Initial Assessment Questions 1. COLOR of URINE: Describe the color of the urine.  (e.g., tea-colored, pink, red, bloody) Do you have blood clots in your urine? (e.g., none, pea, grape, small coin)     Blood in urine with clots 2. ONSET: When did the bleeding start?      Thursday 3. EPISODES: How many times has there been blood in the urine? or How many times today?     unsure 4. PAIN with URINATION: Is there any pain with passing your urine? If Yes, ask: How bad is the pain?  (Scale 1-10; or mild, moderate, severe)     Was having pain in urine - started ABX: no pain 5. FEVER: Do you have a fever? If Yes, ask: What is your temperature, how was it measured, and when did it start?     no 6. ASSOCIATED SYMPTOMS: Are you passing urine more frequently than usual?     Was urinating frequently but none now 7. OTHER SYMPTOMS: Do you have any other symptoms? (e.g., back/flank pain, abdomen pain, vomiting)     no 8. PREGNANCY: Is there any chance you are pregnant? When was your last menstrual period?     na  Protocols used: Urine -  Blood In-A-AH

## 2024-01-26 NOTE — Telephone Encounter (Signed)
 Please offer appt tomorrow 4:30pm with me for blood in urine.

## 2024-01-27 ENCOUNTER — Ambulatory Visit: Admitting: Family Medicine

## 2024-01-27 ENCOUNTER — Ambulatory Visit: Payer: Self-pay | Admitting: Family Medicine

## 2024-01-27 ENCOUNTER — Encounter: Payer: Self-pay | Admitting: Family Medicine

## 2024-01-27 VITALS — BP 122/80 | HR 79 | Temp 98.4°F | Ht 64.0 in | Wt 145.4 lb

## 2024-01-27 DIAGNOSIS — E119 Type 2 diabetes mellitus without complications: Secondary | ICD-10-CM

## 2024-01-27 DIAGNOSIS — I10 Essential (primary) hypertension: Secondary | ICD-10-CM | POA: Diagnosis not present

## 2024-01-27 DIAGNOSIS — G610 Guillain-Barre syndrome: Secondary | ICD-10-CM

## 2024-01-27 DIAGNOSIS — R310 Gross hematuria: Secondary | ICD-10-CM | POA: Diagnosis not present

## 2024-01-27 LAB — POC URINALSYSI DIPSTICK (AUTOMATED)
Bilirubin, UA: NEGATIVE
Blood, UA: NEGATIVE
Glucose, UA: NEGATIVE
Ketones, UA: NEGATIVE
Leukocytes, UA: NEGATIVE
Nitrite, UA: NEGATIVE
Protein, UA: NEGATIVE
Spec Grav, UA: 1.015 (ref 1.010–1.025)
Urobilinogen, UA: 0.2 U/dL
pH, UA: 6 (ref 5.0–8.0)

## 2024-01-27 NOTE — Telephone Encounter (Signed)
 Called patient she will be in here this today at 4:15.

## 2024-01-27 NOTE — Progress Notes (Signed)
 Ph: [phone] Fax: [phone]   Patient ID: Becky Shaw, female    DOB: 11/19/1970, 53 y.o.   MRN: [id number]  This visit was conducted in person.  BP 122/80   Pulse 79   Temp 98.4 F (36.9 C) (Oral)   Ht 5' 4 (1.626 m)   Wt 145 lb 6 oz (65.9 kg)   SpO2 98%   BMI 24.95 kg/m    Chief Complaint  Patient presents with   Hematuria    Pt CC blood in the urine and painful urination.    Subjective:   Discussed the use of AI scribe software for clinical note transcription with the patient, who gave verbal consent to proceed.  History of Present Illness   Becky Shaw is a 53 year old female who presents with blood in the urine.  Increased urinary frequency began last Thursday at 3:00 PM, followed by pink discoloration of urine by 6:00 PM, progressing to visible blood clots by 6:45 PM. Dysuria, chills, and lower abdominal/pelvic pain frequency and dysuria accompanied these symptoms. She was treated at an urgent care facility with antibiotics for a presumed urinary tract infection. Despite some symptom improvement, a urine culture was negative, leading to discontinuation of the antibiotics. A similar, less severe episode of hematuria occurred in April of this year, resolving quickly. She has no history of nephrolithiasis, flank pain, or family history of bladder or kidney issues. She is currently on a seven-day antibiotic course, having completed five days, and has finished taking Pyridium for bladder spasms. No current pain or visible blood in the urine is reported since stopping the medication. She was contacted by urgent care to stop antibiotics after urine culture returned negative for infection.          Relevant past medical, surgical, family and social history reviewed and updated as indicated. Interim medical history since our last visit reviewed. Allergies and medications reviewed and updated. Outpatient Medications Prior to Visit  Medication Sig Dispense Refill    amLODipine  (NORVASC ) 5 MG tablet Take 1 tablet (5 mg total) by mouth at bedtime. 90 tablet 1   aspirin  EC 81 MG tablet Take 1 tablet (81 mg total) by mouth daily. Swallow whole.     atorvastatin  (LIPITOR) 10 MG tablet Take 1 tablet (10 mg total) by mouth every Monday, Wednesday, and Friday. 40 tablet 3   gabapentin  (NEURONTIN ) 100 MG capsule Take 1 capsule (100 mg total) by mouth 2 (two) times daily as needed (nerve pain). 60 capsule 3   gabapentin  (NEURONTIN ) 300 MG capsule Take 1 capsule (300 mg total) by mouth at bedtime. 90 capsule 1   losartan  (COZAAR ) 100 MG tablet Take 1 tablet (100 mg total) by mouth daily. 90 tablet 1   phenazopyridine (PYRIDIUM) 100 MG tablet Take 1 tablet (100 mg total) by mouth 3 (three) times daily as needed (urinary pain). 10 tablet 0   ciprofloxacin (CIPRO) 500 MG tablet Take 1 tablet (500 mg total) by mouth 2 (two) times daily for 7 days. (Patient not taking: Reported on 01/27/2024) 14 tablet 0   No facility-administered medications prior to visit.    Past Medical History:  Diagnosis Date   ALLERGIC RHINITIS 05/17/2007   Essential hypertension 03/22/2015   GANGLION CYST, WRIST, RIGHT 05/31/2008   GBS (Guillain Barre syndrome) 04/19/2023   Sees [facility] neur Dr Tobie  Sp IVIG  Did not tolerate cymbalta  - headaches/dizziness 2025     Hyperlipidemia    OVERACTIVE BLADDER 05/15/2007  Prediabetes    Type 2 diabetes mellitus without complication, without long-term current use of insulin  (HCC) 12/07/2021   Past Surgical History:  Procedure Laterality Date   CHOLECYSTECTOMY      Family History  Problem Relation Age of Onset   Heart Problems Mother    Stroke Father 99   Hypertension Father    Diabetes Father    Hypertension Sister    Hypertension Brother    Stroke Brother 48   Transient ischemic attack Brother 58   Stroke Brother 59       massive   Hypertension Brother    Hypertension Brother    Per HPI unless specifically indicated in ROS section  below Review of Systems  Objective:  BP 122/80   Pulse 79   Temp 98.4 F (36.9 C) (Oral)   Ht 5' 4 (1.626 m)   Wt 145 lb 6 oz (65.9 kg)   SpO2 98%   BMI 24.95 kg/m   Wt Readings from Last 3 Encounters:  01/27/24 145 lb 6 oz (65.9 kg)  01/14/24 143 lb 8 oz (65.1 kg)  01/05/24 142 lb 12.8 oz (64.8 kg)    Physical Exam Vitals and nursing note reviewed.  Constitutional:      Appearance: Normal appearance. She is not ill-appearing.  HENT:     Head: Normocephalic and atraumatic.     Mouth/Throat:     Mouth: Mucous membranes are moist.     Pharynx: Oropharynx is clear. No oropharyngeal exudate or posterior oropharyngeal erythema.  Eyes:     Extraocular Movements: Extraocular movements intact.     Pupils: Pupils are equal, round, and reactive to light.  Cardiovascular:     Rate and Rhythm: Normal rate and regular rhythm.     Pulses: Normal pulses.     Heart sounds: Normal heart sounds. No murmur heard. Pulmonary:     Effort: Pulmonary effort is normal. No respiratory distress.     Breath sounds: Normal breath sounds. No wheezing, rhonchi or rales.  Abdominal:     General: Bowel sounds are normal. There is no distension.     Palpations: Abdomen is soft. There is no mass.     Tenderness: There is no abdominal tenderness. There is no right CVA tenderness, left CVA tenderness, guarding or rebound.     Hernia: No hernia is present.  Musculoskeletal:     Right lower leg: No edema.     Left lower leg: No edema.  Skin:    General: Skin is warm and dry.     Findings: No rash.  Neurological:     Mental Status: She is alert.  Psychiatric:        Mood and Affect: Mood normal.        Behavior: Behavior normal.       Results        Results for orders placed or performed in visit on 01/27/24  POCT Urinalysis Dipstick (Automated)   Collection Time: 01/27/24  4:26 PM  Result Value Ref Range   Color, UA yellow    Clarity, UA clear    Glucose, UA Negative Negative   Bilirubin,  UA negative    Ketones, UA negative    Spec Grav, UA 1.015 1.010 - 1.025   Blood, UA negative    pH, UA 6.0 5.0 - 8.0   Protein, UA Negative Negative   Urobilinogen, UA 0.2 0.2 or 1.0 E.U./dL   Nitrite, UA Negative    Leukocytes, UA Negative Negative  Assessment & Plan:    Gross hematuria Gross hematuria with clots, dysuria, and increased frequency resolved post-antibiotics. Negative urine culture points against infection. Will need to rule out other kidney or intravesicular pathology  - Order CT abdomen and pelvis with and without contrast. - Refer to urology for consideration of cystoscopy. - Provided educational handout on hematuria. - Advised increased water intake post-CT scan.      Problem List Items Addressed This Visit     Essential hypertension   Chronic, better control on current regimen - continue losartan  100mg  daily and amlodipine  5mg  nightly.       Type 2 diabetes mellitus without complication, without long-term current use of insulin  (HCC)   GBS (Guillain Barre syndrome)   Gross hematuria - Primary   See above      Relevant Orders   POCT Urinalysis Dipstick (Automated) (Completed)   Ambulatory referral to Urology   CT ABDOMEN PELVIS W WO CONTRAST     No orders of the defined types were placed in this encounter.   Orders Placed This Encounter  Procedures   CT ABDOMEN PELVIS W WO CONTRAST    Standing Status:   Future    Expiration Date:   01/26/2025    If indicated for the ordered procedure, I authorize the administration of contrast media per Radiology protocol:   Yes    Does the patient have a contrast media/X-ray dye allergy?:   No    Is patient pregnant?:   No    Preferred imaging location?:   [facility]    If indicated for the ordered procedure, I authorize the administration of oral contrast media per Radiology protocol:   Yes   Ambulatory referral to Urology    Referral Priority:   Routine    Referral Type:   Consultation    Referral  Reason:   Specialty Services Required    Requested Specialty:   Urology    Number of Visits Requested:   1   POCT Urinalysis Dipstick (Automated)    Patient Instructions  I will order CT abdomen/pelvis with and without contrast and refer you to urology for further evaluation of visible blood in the urine.  Call centralized scheduling at [facility] to schedule appointment: [phone]    VISIT SUMMARY: You came in today because you noticed blood in your urine, along with increased frequency of urination, discomfort, and chills. You were previously treated with antibiotics for a suspected urinary tract infection, but your urine culture came back negative. Your symptoms have improved since stopping the medication, and you currently have no pain or visible blood in your urine.  YOUR PLAN: -GROSS HEMATURIA: Gross hematuria means there is visible blood in your urine. This can be caused by infections, structural abnormalities, or other issues. We will perform a CT scan of your abdomen and pelvis with and without contrast to get a better look at your urinary tract. You will also be referred to a urologist for a cystoscopy, which is a procedure to look inside your bladder. Please make sure to drink plenty of water after your CT scan. An educational handout on hematuria has been provided to you.  INSTRUCTIONS: Please follow up with the urologist for the cystoscopy. Drink plenty of water after your CT scan. If you notice any new symptoms or if your condition worsens, contact our office immediately.  Follow up plan: Return if symptoms worsen or fail to improve.  Anton Blas, MD

## 2024-01-27 NOTE — Assessment & Plan Note (Signed)
 Chronic, better control on current regimen - continue losartan  100mg  daily and amlodipine  5mg  nightly.

## 2024-01-27 NOTE — Assessment & Plan Note (Signed)
 See above

## 2024-01-27 NOTE — Patient Instructions (Addendum)
 I will order CT abdomen/pelvis with and without contrast and refer you to urology for further evaluation of visible blood in the urine.  Call centralized scheduling at [facility] to schedule appointment: [phone]    VISIT SUMMARY: You came in today because you noticed blood in your urine, along with increased frequency of urination, discomfort, and chills. You were previously treated with antibiotics for a suspected urinary tract infection, but your urine culture came back negative. Your symptoms have improved since stopping the medication, and you currently have no pain or visible blood in your urine.  YOUR PLAN: -GROSS HEMATURIA: Gross hematuria means there is visible blood in your urine. This can be caused by infections, structural abnormalities, or other issues. We will perform a CT scan of your abdomen and pelvis with and without contrast to get a better look at your urinary tract. You will also be referred to a urologist for a cystoscopy, which is a procedure to look inside your bladder. Please make sure to drink plenty of water after your CT scan. An educational handout on hematuria has been provided to you.  INSTRUCTIONS: Please follow up with the urologist for the cystoscopy. Drink plenty of water after your CT scan. If you notice any new symptoms or if your condition worsens, contact our office immediately.

## 2024-01-28 ENCOUNTER — Ambulatory Visit: Admitting: Family Medicine

## 2024-02-05 ENCOUNTER — Ambulatory Visit
Admission: RE | Admit: 2024-02-05 | Discharge: 2024-02-05 | Disposition: A | Discharge status: Home / Self Care | Source: Ambulatory Visit | Attending: Family Medicine | Admitting: Family Medicine

## 2024-02-05 DIAGNOSIS — R310 Gross hematuria: Secondary | ICD-10-CM | POA: Insufficient documentation

## 2024-02-05 MED ORDER — SODIUM CHLORIDE 0.9 % IV SOLN: INTRAVENOUS | Status: DC

## 2024-02-05 MED ORDER — IOHEXOL 300 MG/ML  SOLN
100.0000 mL | Freq: Once | INTRAMUSCULAR | Status: AC | PRN
  Administered 2024-02-05: 100 mL via INTRAVENOUS

## 2024-02-09 ENCOUNTER — Ambulatory Visit: Payer: Self-pay | Admitting: Family Medicine

## 2024-02-17 ENCOUNTER — Encounter: Payer: Self-pay | Admitting: Urology

## 2024-02-17 ENCOUNTER — Ambulatory Visit (INDEPENDENT_AMBULATORY_CARE_PROVIDER_SITE_OTHER): Admitting: Urology

## 2024-02-17 VITALS — BP 120/77 | HR 84 | Ht 61.0 in | Wt 140.0 lb

## 2024-02-17 DIAGNOSIS — R310 Gross hematuria: Secondary | ICD-10-CM | POA: Diagnosis not present

## 2024-02-17 DIAGNOSIS — R8271 Bacteriuria: Secondary | ICD-10-CM

## 2024-02-17 LAB — URINALYSIS, COMPLETE
Bilirubin, UA: NEGATIVE
Glucose, UA: NEGATIVE
Ketones, UA: NEGATIVE
Nitrite, UA: NEGATIVE
Protein,UA: NEGATIVE
RBC, UA: NEGATIVE
Specific Gravity, UA: 1.01 (ref 1.005–1.030)
Urobilinogen, Ur: 0.2 mg/dL (ref 0.2–1.0)
pH, UA: 6 (ref 5.0–7.5)

## 2024-02-17 LAB — MICROSCOPIC EXAMINATION

## 2024-02-17 NOTE — Patient Instructions (Signed)

## 2024-02-17 NOTE — Progress Notes (Signed)
 02/17/2024 10:32 AM   Becky Shaw 1970/05/14 [id number]  Referring provider: Rilla Baller, MD [address] [place],  [place] [address]  Chief Complaint  Patient presents with   Hematuria    HPI: Becky Shaw is a 53 y.o. female referred for evaluation of gross hematuria.  [facility] visit 01/22/2024 complaining of total gross painless hematuria associated with dysuria and urinary frequency.  She described her urine as very bloody with clots. Dipstick urinalysis showed large blood and urine culture was negative. 1 previous episode of hematuria April 2025 No baseline bothersome lower urinary tract symptoms and no complaints today Dr. Rilla ordered a CT urogram which showed no upper tract abnormalities   PMH: Past Medical History:  Diagnosis Date   ALLERGIC RHINITIS 05/17/2007   Essential hypertension 03/22/2015   GANGLION CYST, WRIST, RIGHT 05/31/2008   GBS (Guillain Barre syndrome) 04/19/2023   Sees [facility] neur Dr Tobie  Sp IVIG  Did not tolerate cymbalta  - headaches/dizziness 2025     Hyperlipidemia    OVERACTIVE BLADDER 05/15/2007   Prediabetes    Type 2 diabetes mellitus without complication, without long-term current use of insulin  (HCC) 12/07/2021    Surgical History: Past Surgical History:  Procedure Laterality Date   CHOLECYSTECTOMY      Home Medications:  Allergies as of 02/17/2024       Reactions   Cetirizine Hcl    REACTION: insomnia        Medication List        Accurate as of February 17, 2024 10:32 AM. If you have any questions, ask your nurse or doctor.          amLODipine  5 MG tablet Commonly known as: NORVASC  Take 1 tablet (5 mg total) by mouth at bedtime.   aspirin  EC 81 MG tablet Take 1 tablet (81 mg total) by mouth daily. Swallow whole.   atorvastatin  10 MG tablet Commonly known as: LIPITOR Take 1 tablet (10 mg total) by mouth every Monday, Wednesday, and Friday.   gabapentin  300 MG  capsule Commonly known as: NEURONTIN  Take 1 capsule (300 mg total) by mouth at bedtime.   gabapentin  100 MG capsule Commonly known as: NEURONTIN  Take 1 capsule (100 mg total) by mouth 2 (two) times daily as needed (nerve pain).   losartan  100 MG tablet Commonly known as: COZAAR  Take 1 tablet (100 mg total) by mouth daily.        Allergies:  Allergies  Allergen Reactions   Cetirizine Hcl     REACTION: insomnia    Family History: Family History  Problem Relation Age of Onset   Heart Problems Mother    Stroke Father 66   Hypertension Father    Diabetes Father    Hypertension Sister    Hypertension Brother    Stroke Brother 48   Transient ischemic attack Brother 58   Stroke Brother 81       massive   Hypertension Brother    Hypertension Brother     Social History:  reports that she has never smoked. She has never used smokeless tobacco. She reports that she does not currently use alcohol. She reports that she does not use drugs.   Physical Exam: BP 120/77   Pulse 84   Ht 5' 1 (1.549 m)   Wt 140 lb (63.5 kg)   BMI 26.45 kg/m   Constitutional:  Alert, No acute distress. HEENT: [place] AT Respiratory: Normal respiratory effort, no increased work of breathing. Psychiatric:  Normal mood and affect.  Laboratory Data:  Urinalysis Dipstick trace leukocytes Microscopy 11-30 WBC/3-10 RBC   Pertinent Imaging: CT images were personally reviewed and interpreted   Assessment & Plan:    1. Gross hematuria  AUA hematuria risk stratification: High Negative CT urogram We discussed cystoscopy is recommended for lower tract evaluation.  The procedure was discussed and she desires to schedule.  2.  Asymptomatic bacteriuria Treatment of asymptomatic bacteriuria is not recommended Urine culture was ordered in the event she develops symptoms   Glendia JAYSON Barba, MD  [facility] [address], [address] [place], [place] [address] [phone]

## 2024-02-20 LAB — CULTURE, URINE COMPREHENSIVE

## 2024-03-02 ENCOUNTER — Ambulatory Visit
Admission: RE | Admit: 2024-03-02 | Discharge: 2024-03-02 | Disposition: A | Discharge status: Home / Self Care | Source: Ambulatory Visit | Attending: Family Medicine | Admitting: Family Medicine

## 2024-03-02 ENCOUNTER — Ambulatory Visit: Admitting: Family Medicine

## 2024-03-02 ENCOUNTER — Encounter: Payer: Self-pay | Admitting: Family Medicine

## 2024-03-02 VITALS — BP 125/92 | HR 68 | Temp 98.0°F | Ht 62.8 in | Wt 147.0 lb

## 2024-03-02 DIAGNOSIS — E1129 Type 2 diabetes mellitus with other diabetic kidney complication: Secondary | ICD-10-CM

## 2024-03-02 DIAGNOSIS — R0989 Other specified symptoms and signs involving the circulatory and respiratory systems: Secondary | ICD-10-CM | POA: Insufficient documentation

## 2024-03-02 DIAGNOSIS — I10 Essential (primary) hypertension: Secondary | ICD-10-CM

## 2024-03-02 DIAGNOSIS — E119 Type 2 diabetes mellitus without complications: Secondary | ICD-10-CM

## 2024-03-02 LAB — POCT GLYCOSYLATED HEMOGLOBIN (HGB A1C): Hemoglobin A1C: 6.8 % — AB (ref 4.0–5.6)

## 2024-03-02 NOTE — Progress Notes (Signed)
 Ph: [phone] Fax: [phone]   Patient ID: Becky Shaw, female    DOB: 03-31-1970, 53 y.o.   MRN: [id number]  This visit was conducted in person.  BP (!) 125/92 (BP Location: Left Arm, Patient Position: Sitting, Cuff Size: Normal) Comment: pts device  Pulse 68   Temp 98 F (36.7 C)   Ht 5' 2.8 (1.595 m)   Wt 147 lb (66.7 kg)   SpO2 98%   BMI 26.21 kg/m   BP Readings from Last 3 Encounters:  03/02/24 (!) 125/92  02/17/24 120/77  01/27/24 122/80   Repeat with office cuff: 150/100 Repeat with home cuff: 145/106  CC: f/u visit  Subjective:   HPI: Becky Shaw is a 53 y.o. female presenting on 03/02/2024 for Medical Management of Chronic Issues (No acute concerns, pt reports trying to avoid avoid sugars and carbs pt reports limiting rice to 1 - 1/2 a cup everyday,  then also exercise  by walking inside the house/)   LMP age 72yo  Hematuria - saw urology with reassuring CT urogram pending cystoscopy 03/24/2024.   She did have a late night last night - taking sister to ER, she arrived home at 2am.   Ongoing paresthesias, shin pain, electric shock sensation in evenings due to GBS. Ongoing burning discomfort to lower back. Continues gabapentin  100mg  daily with 600mg  nightly. She continues disability application process  HTN - Compliant with current antihypertensive regimen of amlodipine  5mg  nightly, losartan  100mg  daily. Does check blood pressures at home: 106-120s/80ss. No low blood pressure readings or symptoms of dizziness/syncope. Denies HA, vision changes, CP/tightness, SOB, leg swelling.   DM - does regularly check sugars: 93-113. Compliant with antihyperglycemic regimen which includes: diet controlled. Denies low sugars or hypoglycemic symptoms. Denies paresthesias, blurry vision. Last diabetic eye exam 02/2023 - plans to reschedule in 2026. Glucometer brand: relion. Last foot exam: 01/2023 - DUE. DSME: [facility] 11/2023. Slight microalbuminuria  on latest check.  Lab Results  Component Value Date   HGBA1C 6.8 (A) 03/02/2024   Diabetic Foot Exam - Simple   No data filed    Lab Results  Component Value Date   MICROALBUR 2.1 (H) 10/15/2023         Relevant past medical, surgical, family and social history reviewed and updated as indicated. Interim medical history since our last visit reviewed. Allergies and medications reviewed and updated. Outpatient Medications Prior to Visit  Medication Sig Dispense Refill   amLODipine  (NORVASC ) 5 MG tablet Take 1 tablet (5 mg total) by mouth at bedtime. 90 tablet 1   aspirin  EC 81 MG tablet Take 1 tablet (81 mg total) by mouth daily. Swallow whole.     atorvastatin  (LIPITOR) 10 MG tablet Take 1 tablet (10 mg total) by mouth every Monday, Wednesday, and Friday. 40 tablet 3   gabapentin  (NEURONTIN ) 100 MG capsule Take 1 capsule (100 mg total) by mouth 2 (two) times daily as needed (nerve pain). 60 capsule 3   gabapentin  (NEURONTIN ) 300 MG capsule Take 1 capsule (300 mg total) by mouth at bedtime. 90 capsule 1   losartan  (COZAAR ) 100 MG tablet Take 1 tablet (100 mg total) by mouth daily. 90 tablet 1   No facility-administered medications prior to visit.     Per HPI unless specifically indicated in ROS section below Review of Systems  Objective:  BP (!) 125/92 (BP Location: Left Arm, Patient Position: Sitting, Cuff Size: Normal) Comment: pts device  Pulse 68   Temp 98 F (  36.7 C)   Ht 5' 2.8 (1.595 m)   Wt 147 lb (66.7 kg)   SpO2 98%   BMI 26.21 kg/m   Wt Readings from Last 3 Encounters:  03/02/24 147 lb (66.7 kg)  02/17/24 140 lb (63.5 kg)  01/27/24 145 lb 6 oz (65.9 kg)      Physical Exam Vitals and nursing note reviewed.  Constitutional:      Appearance: Normal appearance. She is not ill-appearing.  HENT:     Head: Normocephalic and atraumatic.     Mouth/Throat:     Mouth: Mucous membranes are moist.     Pharynx: Oropharynx is clear. No oropharyngeal exudate or  posterior oropharyngeal erythema.  Eyes:     Extraocular Movements: Extraocular movements intact.     Conjunctiva/sclera: Conjunctivae normal.     Pupils: Pupils are equal, round, and reactive to light.  Neck:     Thyroid : No thyroid  mass or thyromegaly.  Cardiovascular:     Rate and Rhythm: Normal rate and regular rhythm.     Pulses: Normal pulses.     Heart sounds: Normal heart sounds. No murmur heard. Pulmonary:     Effort: Pulmonary effort is normal. No respiratory distress.     Breath sounds: No wheezing, rhonchi or rales.     Comments: Coarse crackles LLL, do not clear with inspiration Musculoskeletal:     Cervical back: Normal range of motion and neck supple.     Right lower leg: No edema.     Left lower leg: No edema.  Skin:    General: Skin is warm and dry.     Findings: No rash.  Neurological:     Mental Status: She is alert.  Psychiatric:        Mood and Affect: Mood normal.        Behavior: Behavior normal.       Results for orders placed or performed in visit on 03/02/24  POCT glycosylated hemoglobin (Hb A1C)   Collection Time: 03/02/24 10:49 AM  Result Value Ref Range   Hemoglobin A1C 6.8 (A) 4.0 - 5.6 %   HbA1c POC (<> result, manual entry)     HbA1c, POC (prediabetic range)     HbA1c, POC (controlled diabetic range)      Assessment & Plan:   Problem List Items Addressed This Visit     Essential hypertension   Chronic, BP elevated in office today however home readings largely well controlled.  Will titrate antihypertensive based on home readings.  Home cuff overall comparable to our readings      Type 2 diabetes mellitus with microalbuminuria, without long-term current use of insulin  (HCC) - Primary   Chronic, diet controlled. Reviewed diet measures to maintain glycemic control.  Will stay off glycemic medication at this time.  Continue ARB.       Relevant Orders   POCT glycosylated hemoglobin (Hb A1C) (Completed)   Abnormal breath sounds    LLL coarse crackles that persist despite deep breath.  Recent CT abd/pelvis 01/2024 and CXR 03/2023 unrevealing.  Will update 2 view CXR today.       Relevant Orders   DG Chest 2 View     No orders of the defined types were placed in this encounter.   Orders Placed This Encounter  Procedures   DG Chest 2 View    Standing Status:   Future    Expiration Date:   03/02/2025    Reason for Exam (SYMPTOM  OR DIAGNOSIS REQUIRED):  LLL crackles    Is patient pregnant?:   No    Preferred imaging location?:   [facility]   POCT glycosylated hemoglobin (Hb A1C)    Patient Instructions  BP was elevated in office but home readings are running well - continue monitoring at home, continue current regimen  Sugars are staying well controlled - continue diabetic diet.  Chest xray today for some crackles heard to left lung base.  Return as needed or in 3-4 months for follow up visit  Follow up plan: Return in about 3 months (around 05/31/2024) for follow up visit.  Anton Blas, MD

## 2024-03-02 NOTE — Assessment & Plan Note (Addendum)
 Chronic, diet controlled. Reviewed diet measures to maintain glycemic control.  Will stay off glycemic medication at this time.  Continue ARB.

## 2024-03-02 NOTE — Assessment & Plan Note (Signed)
 Chronic, BP elevated in office today however home readings largely well controlled.  Will titrate antihypertensive based on home readings.  Home cuff overall comparable to our readings

## 2024-03-02 NOTE — Patient Instructions (Signed)
 BP was elevated in office but home readings are running well - continue monitoring at home, continue current regimen  Sugars are staying well controlled - continue diabetic diet.  Chest xray today for some crackles heard to left lung base.  Return as needed or in 3-4 months for follow up visit

## 2024-03-02 NOTE — Assessment & Plan Note (Signed)
 LLL coarse crackles that persist despite deep breath.  Recent CT abd/pelvis 01/2024 and CXR 03/2023 unrevealing.  Will update 2 view CXR today.

## 2024-03-08 ENCOUNTER — Ambulatory Visit: Payer: Self-pay | Admitting: Family Medicine

## 2024-03-24 ENCOUNTER — Encounter: Payer: Self-pay | Admitting: Urology

## 2024-03-24 ENCOUNTER — Ambulatory Visit: Admitting: Urology

## 2024-03-24 VITALS — BP 151/95 | HR 96

## 2024-03-24 DIAGNOSIS — R310 Gross hematuria: Secondary | ICD-10-CM

## 2024-03-24 LAB — URINALYSIS, COMPLETE
Bilirubin, UA: NEGATIVE
Glucose, UA: NEGATIVE
Ketones, UA: NEGATIVE
Leukocytes,UA: NEGATIVE
Nitrite, UA: NEGATIVE
Protein,UA: NEGATIVE
RBC, UA: NEGATIVE
Specific Gravity, UA: 1.01 (ref 1.005–1.030)
Urobilinogen, Ur: 0.2 mg/dL (ref 0.2–1.0)
pH, UA: 6 (ref 5.0–7.5)

## 2024-03-24 LAB — MICROSCOPIC EXAMINATION
Bacteria, UA: NONE SEEN
RBC, Urine: NONE SEEN /HPF (ref 0–2)

## 2024-03-24 MED ORDER — LIDOCAINE HCL URETHRAL/MUCOSAL 2 % EX GEL
1.0000 | Freq: Once | CUTANEOUS | Status: AC
  Administered 2024-03-24: 1 via URETHRAL

## 2024-03-24 NOTE — Progress Notes (Signed)
" ° °  03/24/2024  CC:  Chief Complaint  Patient presents with   Cysto    HPI: Refer to my prior note 02/17/2024.  Denies recurrent gross hematuria.  UA today dipstick/microscopy negative  Blood pressure (!) 151/95, pulse 96, SpO2 98%. NED. A&Ox3.   No respiratory distress   Abd soft, NT, ND Normal external genitalia with patent urethral meatus  Cystoscopy Procedure Note  Patient identification was confirmed, informed consent was obtained, and patient was prepped using Betadine solution.  Lidocaine  jelly was administered per urethral meatus.    Procedure: - Flexible cystoscope introduced, without any difficulty.   - Thorough search of the bladder revealed:    normal urethral meatus    ~1 cm area prominent hypervascularity/superficial blood vessels    no stones    no ulcers     no tumors    no urethral polyps    no trabeculation  - Ureteral orifices were normal in position and appearance.  Post-Procedure: - Patient tolerated the procedure well  Assessment/ Plan: Area hypervascularity right bladder dome which was the most likely source of her hematuria.  No intramural mass on CT Discussed scheduling cystoscopy with fulguration electively or if she develops recurrent gross hematuria.  She has elected the latter and will call for recurrent bleeding    Glendia JAYSON Barba, MD "

## 2024-03-30 ENCOUNTER — Ambulatory Visit: Admitting: Neurology

## 2024-03-30 ENCOUNTER — Encounter: Payer: Self-pay | Admitting: Neurology

## 2024-03-30 VITALS — BP 119/83 | HR 82 | Ht 62.8 in | Wt 145.0 lb

## 2024-03-30 DIAGNOSIS — G610 Guillain-Barre syndrome: Secondary | ICD-10-CM | POA: Diagnosis not present

## 2024-03-30 NOTE — Progress Notes (Signed)
 "   Follow-up Visit   Date: 03/30/2024    Becky Shaw MRN: [id number] DOB: 12/31/1970    Becky Shaw is a 54 y.o. right-handed female with diabetes mellitus, hypertension, hyperlipidemia returning to the clinic for follow-up of Guillain-Barre syndrome.  The patient was accompanied to the clinic by self.  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  She was treated with IVIG and made significant improvements.   She has mild residual numbness and pressure sensation.  - Continue gabapentin  100 mg in the morning and 300 mg at bedtime. - Encouraged daily core strengthening exercises. - She is okay to get flu vaccination due to low risk of recurrence.   Return to clinic in 9 months    --------------------------------------------- History of present illness: Starting on January 17th, she began having numbness/tingling of the toes and over the next few days, she began having more intense symptoms in the hands, mouth, tongue, and back.  She was also having generalized weakness.  She went to the ER and was recommended out-patient follow-up with neurology.  A few days later, she became so weak that she was unable to walk, which prompted her to go to [facility].     She was evaluated by neurology who had high clinical suspicion for GBS and CSF testing confirmed the presence of albuminocytologic dissociation (CSF protein > 600), MRI lumbar spine with nerve root enhancement.  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  She was treated with IVIG 2g/kg over 5 days.  She has been more aware of temperature and tingling is less.  She continues to have diffuse numbness and weakness.  She also has facial weakness with incomplete eyeclosure on the right and is unable to smile. She is  walking with a walker.     She works as a research scientist (medical) at [employer].  She lives with husband and 50 year old son.  She does not smoke or drink alcohol.   UPDATE 06/11/2023:  She is here for follow-up visit.  She has completed home PT/OT and able to walk unassisted, but still is cautious because of imbalance.  She is not able to walk as fast as she previously could.  Her strength is improving.  She is able to swallow better and arms are stronger.  She tried to go to her office to work but noticed that typing and opening envelopes creates increased sensitivity of the fingertips and tingling.  She continues to have numbness in her back and feet.   UPDATE 09/23/2023:  She is here for follow-up visit.  Overall, she is doing well but has noticed a few additional symptoms.  She continues to have numbness involving the tailbone region. She had burning sensation involving the heels.  She takes gabapentin  600mg  at bedtime. She had left knee pain upon standing.  She has episodic shooting pain involving the right leg into the feet.  She had night time excessive sweating which occurs several times per night.  She also reports that she does not get any bubbles when shampooing her hair and it feels sticky.     UPDATE 03/30/2024:  She is here for follow-up visit.  Discussed the use of AI scribe software for clinical note transcription with the patient, who gave verbal consent to proceed.  History of Present Illness She experiences a sensation of pressure in her chest,  particularly when bending down, describing it as something pushing against her chest and making it harder to breathe. There is also numbness in the area, as she cannot feel the menthol sensation when applying Vicks VapoRub.  She reports numbness in her toe, which persists despite applying topical treatments like Bengay or Vicks VapoRub. Additionally, she experiences electric shock-like sensations in her feet, which occur less frequently but with greater  intensity than before.  She was previously started on duloxetine  but switched back to gabapentin  due to severe headaches. She currently takes 100 mg of gabapentin  in the morning and 300 mg at night.  No recent falls except for 2 in July.   Medications:  Current Outpatient Medications on File Prior to Visit  Medication Sig Dispense Refill   amLODipine  (NORVASC ) 5 MG tablet Take 1 tablet (5 mg total) by mouth at bedtime. 90 tablet 1   aspirin  EC 81 MG tablet Take 1 tablet (81 mg total) by mouth daily. Swallow whole.     atorvastatin  (LIPITOR) 10 MG tablet Take 1 tablet (10 mg total) by mouth every Monday, Wednesday, and Friday. 40 tablet 3   gabapentin  (NEURONTIN ) 100 MG capsule Take 1 capsule (100 mg total) by mouth 2 (two) times daily as needed (nerve pain). 60 capsule 3   gabapentin  (NEURONTIN ) 300 MG capsule Take 1 capsule (300 mg total) by mouth at bedtime. 90 capsule 1   losartan  (COZAAR ) 100 MG tablet Take 1 tablet (100 mg total) by mouth daily. 90 tablet 1   No current facility-administered medications on file prior to visit.    Allergies:  Allergies  Allergen Reactions   Cetirizine Hcl     REACTION: insomnia    Vital Signs:  BP 119/83   Pulse 82   Ht 5' 2.8 (1.595 m)   Wt 145 lb (65.8 kg)   SpO2 98%   BMI 25.85 kg/m   Neurological Exam: MENTAL STATUS including orientation to time, place, person, recent and remote memory, attention span and concentration, language, and fund of knowledge is normal.  Speech is not dysarthric.  CRANIAL NERVES:  Pupils equal round and reactive to light.  Normal conjugate, extra-ocular eye movements in all directions of gaze.  No ptosis. Mild periorbital edema.  Face is symmetric, mild facial weakness with smile.    MOTOR:  No atrophy, fasciculations or abnormal movements.  No pronator drift.   Upper Extremity:  Right  Left  Deltoid  5/5   5/5   Biceps  5/5   5/5   Triceps  5/5   5/5   Wrist extensors  5/5   5/5   Wrist flexors  5/5    5/5   Finger extensors  5/5   5/5   Finger flexors  5/5   5/5   Dorsal interossei  5/5   5/5   Abductor pollicis  5/5   5/5   Tone (Ashworth scale)  0  0   Lower Extremity:  Right  Left  Hip flexors  5/5   5/5   Knee flexors  5/5   5/5   Knee extensors  5/5   5/5   Dorsiflexors  5/5   5/5   Plantarflexors  5/5   5/5   Toe extensors  5-/5   5-/5   Toe flexors  5-/5   5-/5   Tone (Ashworth scale)  0  0   MSRs:  Right        Left brachioradialis 2+  2+  biceps 2+  2+  triceps 2+  2+  patellar 2+  2+  ankle jerk tr  tr   SENSORY:  Intact to vibration, pin prick, and temperature throughout, including abdomen.   COORDINATION/GAIT:   Gait mildly wide-based and stable, unassisted.  Mild unsteadiness with stressed and tandem gait but able to perform.   Data: NCS/EMG of the left side 05/30/2023: This is a normal study of the left upper and lower extremities.  In particular, there is no evidence of a large fiber sensorimotor polyneuropathy or cervical/lumbosacral radiculopathy.   MRI lumbar spine 04/20/2023: 1. Diffuse thickening and enhancement of the nerve roots of the cauda equina. This is consistent with the clinical diagnosis of Guillain-Barre syndrome. The differential diagnosis includes an acute presentation of CIDP. 2. No significant disc protrusion or stenosis in the lumbar spine.   MRI cervical spine 04/15/2023: 1. Normal MRI appearance of the cervical spinal cord. 2. Mild noncompressive disc bulging at C2-3 through C6-7 without significant stenosis or overt neural impingement.   MRI brain wo contrast 04/15/2023: 1. No acute intracranial abnormality. 2. Subcentimeter remote lacunar infarct at the posterior aspect of the right lentiform nucleus. 3. Otherwise normal brain MRI.   CSF 04/19/2023:  R19 R9 P>600 G144 encephalitis panel neg, IgG index - unable to be calculated   Thank you for allowing me to participate in patient's care.   If I can answer any additional questions, I would be pleased to do so.    Sincerely,    Demani Weyrauch K. Tobie, DO   "

## 2024-04-29 ENCOUNTER — Other Ambulatory Visit: Payer: Self-pay | Admitting: Neurology

## 2024-05-31 ENCOUNTER — Ambulatory Visit: Admitting: Family Medicine

## 2024-06-02 ENCOUNTER — Ambulatory Visit: Admitting: Family Medicine

## 2024-12-28 ENCOUNTER — Ambulatory Visit: Payer: Self-pay | Admitting: Neurology
